# Patient Record
Sex: Female | Born: 1970 | Race: White | Hispanic: No | Marital: Married | Smoking: Never smoker
Health system: Southern US, Community
[De-identification: ages and names within clinical notes are randomized; demographics above are authoritative.]

## PROBLEM LIST (undated history)

## (undated) DIAGNOSIS — I4891 Unspecified atrial fibrillation: Secondary | ICD-10-CM

## (undated) DIAGNOSIS — L409 Psoriasis, unspecified: Secondary | ICD-10-CM

## (undated) DIAGNOSIS — E282 Polycystic ovarian syndrome: Secondary | ICD-10-CM

## (undated) DIAGNOSIS — I471 Supraventricular tachycardia, unspecified: Secondary | ICD-10-CM

## (undated) DIAGNOSIS — L97519 Non-pressure chronic ulcer of other part of right foot with unspecified severity: Secondary | ICD-10-CM

## (undated) DIAGNOSIS — E11621 Type 2 diabetes mellitus with foot ulcer: Secondary | ICD-10-CM

## (undated) DIAGNOSIS — E119 Type 2 diabetes mellitus without complications: Secondary | ICD-10-CM

## (undated) DIAGNOSIS — L4050 Arthropathic psoriasis, unspecified: Secondary | ICD-10-CM

## (undated) DIAGNOSIS — E11319 Type 2 diabetes mellitus with unspecified diabetic retinopathy without macular edema: Secondary | ICD-10-CM

## (undated) HISTORY — PX: FRACTURE SURGERY: SHX138

## (undated) HISTORY — PX: FOOT FRACTURE SURGERY: SHX645

---

## 1998-04-27 ENCOUNTER — Ambulatory Visit (HOSPITAL_COMMUNITY): Admission: RE | Admit: 1998-04-27 | Discharge: 1998-04-27 | Payer: Self-pay | Admitting: Gastroenterology

## 1999-12-09 ENCOUNTER — Encounter: Payer: Self-pay | Admitting: Emergency Medicine

## 1999-12-09 ENCOUNTER — Emergency Department (HOSPITAL_COMMUNITY): Admission: EM | Admit: 1999-12-09 | Discharge: 1999-12-09 | Payer: Self-pay | Admitting: Emergency Medicine

## 1999-12-10 ENCOUNTER — Encounter: Payer: Self-pay | Admitting: Emergency Medicine

## 2002-09-03 ENCOUNTER — Encounter: Payer: Self-pay | Admitting: Internal Medicine

## 2002-09-03 ENCOUNTER — Encounter: Admission: RE | Admit: 2002-09-03 | Discharge: 2002-09-03 | Payer: Self-pay | Admitting: Internal Medicine

## 2003-03-11 ENCOUNTER — Encounter: Admission: RE | Admit: 2003-03-11 | Discharge: 2003-03-11 | Payer: Self-pay | Admitting: Internal Medicine

## 2003-03-11 ENCOUNTER — Encounter: Payer: Self-pay | Admitting: Internal Medicine

## 2003-09-19 HISTORY — PX: TUBAL LIGATION: SHX77

## 2004-01-25 ENCOUNTER — Other Ambulatory Visit: Admission: RE | Admit: 2004-01-25 | Discharge: 2004-01-25 | Payer: Self-pay | Admitting: Obstetrics & Gynecology

## 2004-06-20 ENCOUNTER — Inpatient Hospital Stay (HOSPITAL_COMMUNITY): Admission: AD | Admit: 2004-06-20 | Discharge: 2004-06-20 | Payer: Self-pay | Admitting: Obstetrics & Gynecology

## 2004-08-08 ENCOUNTER — Inpatient Hospital Stay (HOSPITAL_COMMUNITY): Admission: AD | Admit: 2004-08-08 | Discharge: 2004-08-12 | Payer: Self-pay | Admitting: Obstetrics & Gynecology

## 2004-08-08 ENCOUNTER — Encounter (INDEPENDENT_AMBULATORY_CARE_PROVIDER_SITE_OTHER): Payer: Self-pay | Admitting: Specialist

## 2006-01-17 ENCOUNTER — Encounter: Payer: Self-pay | Admitting: Internal Medicine

## 2006-11-16 ENCOUNTER — Ambulatory Visit (HOSPITAL_COMMUNITY): Admission: RE | Admit: 2006-11-16 | Discharge: 2006-11-16 | Payer: Self-pay | Admitting: Internal Medicine

## 2009-11-22 ENCOUNTER — Encounter: Payer: Self-pay | Admitting: Endocrinology

## 2009-12-02 ENCOUNTER — Ambulatory Visit: Payer: Self-pay | Admitting: Endocrinology

## 2009-12-02 DIAGNOSIS — E119 Type 2 diabetes mellitus without complications: Secondary | ICD-10-CM | POA: Insufficient documentation

## 2009-12-02 DIAGNOSIS — R718 Other abnormality of red blood cells: Secondary | ICD-10-CM | POA: Insufficient documentation

## 2009-12-02 DIAGNOSIS — E282 Polycystic ovarian syndrome: Secondary | ICD-10-CM | POA: Insufficient documentation

## 2009-12-02 DIAGNOSIS — L4050 Arthropathic psoriasis, unspecified: Secondary | ICD-10-CM | POA: Insufficient documentation

## 2009-12-02 DIAGNOSIS — G43909 Migraine, unspecified, not intractable, without status migrainosus: Secondary | ICD-10-CM | POA: Insufficient documentation

## 2009-12-02 DIAGNOSIS — Z794 Long term (current) use of insulin: Secondary | ICD-10-CM

## 2009-12-02 DIAGNOSIS — K589 Irritable bowel syndrome without diarrhea: Secondary | ICD-10-CM | POA: Insufficient documentation

## 2010-01-10 ENCOUNTER — Ambulatory Visit: Payer: Self-pay | Admitting: Endocrinology

## 2010-01-10 LAB — CONVERTED CEMR LAB
Cholesterol: 153 mg/dL (ref 0–200)
HDL: 33 mg/dL — ABNORMAL LOW (ref >39.00)
Hgb S Quant: 0 % (ref 0.0–0.0)
LDL Cholesterol: 94 mg/dL (ref 0–99)
Total CHOL/HDL Ratio: 5
Triglycerides: 132 mg/dL (ref 0.0–149.0)
VLDL: 26.4 mg/dL (ref 0.0–40.0)

## 2010-01-13 ENCOUNTER — Telehealth: Payer: Self-pay | Admitting: Endocrinology

## 2010-05-19 ENCOUNTER — Telehealth: Payer: Self-pay | Admitting: Endocrinology

## 2010-10-18 NOTE — Progress Notes (Signed)
Summary: OV due  Phone Note Outgoing Call Call back at [facility] Phone [phone]   Call placed by: Brenton Grills MA,  May 19, 2010 9:59 AM Details for Reason: OV due Summary of Call: Per MD, pt is due for an office visit. Left message for pt to callback and schedule appt.

## 2010-10-18 NOTE — Progress Notes (Signed)
Summary: Chol meds  Phone Note Call from Patient Call back at Home Phone [phone]   Caller: Patient Summary of Call: pt called stating that MD left message on PT advising pt to start cholesterol meds and if she wanted it he would provide samples. Pt would like to start medication and pick up samples today to try before paying for a prescription. Initial call taken by: Margaret Pyle, CMA,  January 13, 2010 11:08 AM  Follow-up for Phone Call        i sent rx to pharmacy.  it is very cheap, so we don't get samples.  go to lab in 1 month for lipids 272.0, and liver v58.68. Follow-up by: Minus Breeding MD,  January 13, 2010 12:42 PM  Additional Follow-up for Phone Call Additional follow up Details #1::        left message on machine for pt to return my call. Pt labs scheduled 05/16. Margaret Pyle, CMA  January 13, 2010 12:59 PM  PT INFORMED Additional Follow-up by: Margaret Pyle, CMA,  January 13, 2010 5:00 PM    New/Updated Medications: PRAVASTATIN SODIUM 40 MG TABS (PRAVASTATIN SODIUM) 1 tab at bedtime Prescriptions: PRAVASTATIN SODIUM 40 MG TABS (PRAVASTATIN SODIUM) 1 tab at bedtime  #30 x 11   Entered and Authorized by:   Minus Breeding MD   Signed by:   Minus Breeding MD on 01/13/2010   Method used:   Electronically to        [facility] [address] [phone]* (retail)       [address] [place], [place]  [address]       Ph: [phone]       Fax: [phone]   RxID:   [id number]

## 2010-10-18 NOTE — Assessment & Plan Note (Signed)
Summary: 1 MTH FU  STC   Vital Signs:  Patient profile:   40 year old female Height:      67 inches (170.18 cm) Weight:      253.38 pounds (115.17 kg) O2 Sat:      96 % on Room air Temp:     97.4 degrees F (36.33 degrees C) oral Pulse rate:   102 / minute BP sitting:   110 / 62  (left arm) Cuff size:   large  Vitals Entered By: Josph Macho RMA (January 10, 2010 8:58 AM)  O2 Flow:  Room air CC: 1 month follow up/ CF Is Patient Diabetic? Yes   Referring Provider:  [facility]  CC:  1 month follow up/ CF.  History of Present Illness: the status of at least 3 ongoing medical problems is addressed today: dm: no cbg record, but states cbg's are still 250-250.   pt states she feels well in general. microcytosis:  was noted on recent labs.  pt requests eval of this. hyperlipiidemia:  pt says she hsn't required any medication for this, and is working on her diet.  Current Medications (verified): 1)  Glucophage Xr 500 Mg Xr24h-Tab (Metformin Hcl) .... 4 Tabs Each Am 2)  Naproxen Sodium 550 Mg Tabs (Naproxen Sodium) .... Two Times A Day 3)  Januvia 100 Mg Tabs (Sitagliptin Phosphate) .Marland Kitchen.. 1 Tab Each Am  Allergies (verified): No Known Drug Allergies  Past History:  Past Medical History: Last updated: 12/02/2009 POLYCYSTIC OVARIES (ICD-256.4) IBS (ICD-564.1) MIGRAINE HEADACHE (ICD-346.90) PSORIASIS (ICD-696.1) IDDM (ICD-250.01)  Review of Systems  The patient denies hypoglycemia, weight loss, and weight gain.    Physical Exam  General:  obese.   Extremities:  no edema Additional Exam:  LDL Cholesterol           94 mg/dL   Impression & Recommendations:  Problem # 1:  DM (ICD-250.00) needs increased rx, but tt agrees only to change [place] to victoza.  this change is unlikely to get her a1c to goal  Problem # 2:  hyperlipidemia mild  Problem # 3:  MICROCYTOSIS (ICD-790.09)  Medications Added to Medication List This Visit: 1)  Victoza 18 Mg/55ml Soln  (Liraglutide) .... 0.6 mg each am  Other Orders: T- * Misc. Laboratory test [id number]) TLB-Lipid Panel (80061-LIPID) Est. Patient Level III (44010)  Patient Instructions: 1)  check your blood sugar 1-2 times a day.  vary the time of day when you check, between before the 3 meals, and at bedtime.  also check if you have symptoms of your blood sugar being too high or too low.  please keep a record of the readings and bring it to your next appointment here.  please call us sooner if you are having low blood sugar episodes. 2)  change januvia to victoza, 0.6 mg each am.  then try to increase it to its maximun amount of 1.8 mg each am. 3)  return 2-3 weeks 4)  (update: i left message on phone-tree:  you should consider cholesterol medication.  hb electrophoresis is normal).

## 2010-10-18 NOTE — Assessment & Plan Note (Signed)
Summary: NEW ENDO/[facility]/ON METFORMIN/NOT REGULATED/BMI42 LOSS 76LBS SINC...   Vital Signs:  Patient profile:   40 year old female Height:      67 inches (170.18 cm) Weight:      253.25 pounds (115.11 kg) BMI:     39.81 O2 Sat:      95 % on Room air Temp:     98.2 degrees F (36.78 degrees C) oral Pulse rate:   112 / minute BP sitting:   138 / 82  (left arm) Cuff size:   regular  Vitals Entered By: Josph Macho RMA (December 02, 2009 1:22 PM)  O2 Flow:  Room air CC: New Endo: Blood Sugar high/ CF Is Patient Diabetic? Yes   Referring Provider:  [facility]  CC:  New Endo: Blood Sugar high/ CF.  History of Present Illness: pt states 10 years h/o dm.  she denies knowing of any chronic complications.  she has never been on insulin, except for a 2002 pregnancy.  she takes metformin until she ran out 3 weeks ago.   pt says her diet is "good," and her exercise is poor.   symptomatically, pt states slight excess hair growth on the face, and associated hair loss on the head.   Current Medications (verified): 1)  Glucophage Xr 500 Mg Xr24h-Tab (Metformin Hcl) .... 2 Daily 2)  Naproxen Sodium 550 Mg Tabs (Naproxen Sodium) .... Two Times A Day  Allergies (verified): No Known Drug Allergies  Past History:  Past Medical History: POLYCYSTIC OVARIES (ICD-256.4) IBS (ICD-564.1) MIGRAINE HEADACHE (ICD-346.90) PSORIASIS (ICD-696.1) IDDM (ICD-250.01)  Past Surgical History: tubal ligation 2005  Family History: Reviewed history and no changes required. 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  she has lost 70 lbs x 7 years.  she has intermittent headache, nocturnal leg cramps, easy bruising,and slight blurry vision.  she had neg eval for chest pain.  she has polyuria and  polydipsia.   Physical Exam  General:  morbidly obese.   Head:  head: no deformity eyes: no periorbital swelling, no proptosis external nose and ears are normal mouth: no lesion seen Neck:  Supple without thyroid enlargement or tenderness.  Lungs:  Clear to auscultation bilaterally. Normal respiratory effort.  Heart:  Regular rate and rhythm without murmurs or gallops noted. Normal S1,S2.   Msk:  muscle bulk and strength are grossly normal.  no obvious joint swelling.  gait is normal and steady  Extremities:  no deformity.  no ulcer on the feet.  feet are of normal color and temp.  there is bilateral onychomycosis trace right pedal edema and trace left pedal edema.   Neurologic:  cn 2-12 grossly intact.   readily moves all 4's.   sensation is intact to touch on all 4's Skin:  there is slight terminal hair on the face there are striae on the abdomen and flanks, but not dark-colored. Cervical Nodes:  No significant adenopathy.  Psych:  Alert and cooperative; normal mood and affect; normal attention span and concentration.   Additional Exam:  outside test results are reviewed:  a1c=12.4 mcv=78 hemoglobin=normal   Impression & Recommendations:  Problem # 1:  DM (ICD-250.00) needs increased rx  Problem # 2:  edema, mild this is a relative contraindication to actos  Problem # 3:  POLYCYSTIC OVARIES (ICD-256.4) with mild hirsutism  Problem # 4:  microcytosis  Medications Added to Medication List This Visit: 1)  Glucophage Xr 500 Mg Xr24h-tab (Metformin hcl) .... 2 daily 2)  Glucophage Xr 500 Mg Xr24h-tab (Metformin hcl) .... 4 tabs each am 3)  Naproxen Sodium 550 Mg Tabs (Naproxen sodium) .... Two times a day 4)  Januvia 100 Mg Tabs (Sitagliptin phosphate) .Marland Kitchen.. 1 tab each am  Other Orders: Consultation Level IV ([address])  Patient Instructions: 1)  we discussed the importance of diet and exercise therapy and the risks of diabetes.  you should see an eye doctor every  year. 2)  it is very important to keep good control of blood pressure and cholesterol, especially in those with diabetes.  stopping smoking also reduces the damage diabetes does to your body.  please discuss these with your doctor.  you should take an aspirin every day, unless you have been advised by a doctor not to. 3)  i told pt we will need to take this complex situation in stages 4)  check your blood sugar 1-2 times a day.  vary the time of day when you check, between before the 3 meals, and at bedtime.  also check if you have symptoms of your blood sugar being too high or too low.  please keep a record of the readings and bring it to your next appointment here.  please call us sooner if you are having low blood sugar episodes. 5)  please consider weight-loss surgery.  please let me know if you want to pursue, and i'll request an appointment at an upcoming informational meeting. 6)  plan will be for hemoglobin electophoresis and lipids with next labs. 7)  for now: 8)  resume metformin-xr 5x 500 mg each am 9)  add januvia 100 mg each am. 10)  call next week if blood sugar is still over 200, and we'll consider changing the [place] to victoza. 11)  Please schedule a follow-up appointment in 1 month. Prescriptions: JANUVIA 100 MG TABS (SITAGLIPTIN PHOSPHATE) 1 tab each am  #30 x 11   Entered and Authorized by:   Minus Breeding MD   Signed by:   Minus Breeding MD on 12/02/2009   Method used:   Electronically to        [facility] [address] [phone]* (retail)       [address] [place], [place]  [address]       Ph: [phone]       Fax: [phone]   RxID:   [id number] GLUCOPHAGE XR 500 MG XR24H-TAB (METFORMIN HCL) 4 tabs each am  #360 x 11   Entered and Authorized by:   Minus Breeding MD   Signed by:   Minus Breeding MD on 12/02/2009   Method used:   Electronically to        [facility] [address] [phone]* (retail)       [address] [place], [place]  [address]       Ph:  [phone]       Fax: [phone]   RxID:   [id number]

## 2011-02-03 NOTE — H&P (Signed)
[name], [name] NO.:  [id number]   MEDICAL RECORD NO.:  [id number]          PATIENT TYPE:  MAT   LOCATION:  [facility]C                          [facility]ILITY:  [facility]   PHYSICIAN:  Lenoard Aden, M.D.DATE OF BIRTH:  [birth year]   DATE OF ADMISSION:  06/20/2004  DATE OF DISCHARGE:                                HISTORY & PHYSICAL   CHIEF COMPLAINT:  Bleeding.   HISTORY OF PRESENT ILLNESS:  Patient is a 40 year old white female G2, P30,  EDD of August 21, 2004 at [redacted] weeks gestation who presents with one episode  of bright red blood on the toilet paper this afternoon.  She denies any  worsening lower abdominal cramping, denies any heavy bleeding, does report  decreased fetal movement today.  Her medications include prenatal vitamins  and insulin multidose injections for adult-onset diabetes.  She has an  obstetric history remarkable for a 36-week delivery of a 7-pound 5-ounce  female in 28.  She has a history of polycystic ovarian disease and history  of asthma.  She has a family history of heart disease, diabetes,  neurovascular disease.  Prenatal lab data reveals a blood type of A  negative, she is status post RhoGAM on June 02, 2004, rubella immune,  hepatitis/HIV negative, GC/Chlamydia negative.  Prenatal course was  uncomplicated.  She has had normal growth on ultrasound surveillance.  She  has had borderline glucose compliance and reportedly a normal fetal  echocardiogram.  She had a normal ultrasound as recently as June 02, 2004 with a normal posterior placenta and an AGA fetus.   PHYSICAL EXAMINATION:  On physical exam vital signs are stable, patient is  afebrile, HEENT normal, lungs clear, heart regular rhythm, abdomen soft,  gravid, obese and nontender.  Cath UA is obtained.  Sterile speculum exam  reveals no evidence of blood in the vagina, cervix is closed and long,  presenting part is out of the pelvis.  Hemoccult is performed and is  pending.   Questionable small internal hemorrhoid is palpable, no external  hemorrhoids are noted.  Extremities feel no cords.  Neurologic exam is  nonfocal.   IMPRESSION:  1.  Thirty-one week obstetric.  2.  Body mass index greater than 35.  3.  Insulin-dependent diabetes with normal surveillance.  4.  Unexplained third trimester bleeding questionable source.   PLAN:  Check Hemoccult, check urinalysis, check complete obstetric  ultrasound.  We will discharge home pending results.      RJT/MEDQ  D:  06/20/2004  T:  06/20/2004  Job:  [id number]   cc:   Ma Hillock

## 2011-02-03 NOTE — Discharge Summary (Signed)
[name], [name]              ACCOUNT NO.:  [id number]   MEDICAL RECORD NO.:  [id number]          PATIENT TYPE:  INP   LOCATION:  9124                          [facility]ILITY:  [facility]   PHYSICIAN:  Genia Del, M.D.DATE OF BIRTH:  September 10, 1971   DATE OF ADMISSION:  08/08/2004  DATE OF DISCHARGE:  08/12/2004                                 DISCHARGE SUMMARY   ADMISSION DIAGNOSES:  1.  Thirty-eight weeks.  2.  Diabetes mellitus type 2, on insulin.  3.  Obesity.  4.  Suspicion of macrosomia.  5.  Group B streptococcus positive.  6.  Spontaneous labor.  7.  Spontaneous rupture of membranes.  8.  Desire for bilateral tubal sterilization.   DISCHARGE DIAGNOSES:  1.  Thirty-eight weeks.  2.  Diabetes mellitus type 2, on insulin.  3.  Obesity.  4.  Suspicion of macrosomia.  5.  Group B streptococcus positive.  6.  Spontaneous labor.  7.  Spontaneous rupture of membranes.  8.  Desire for bilateral tubal sterilization.  9.  Confirmed macrosomia, neonatal weight 4320 g, baby girl born by cesarean      section.   INTERVENTION:  Primary urgent low transverse C-section and modified Pomeroy  bilateral tubal sterilization.   HOSPITAL COURSE:  The patient's surgery went without any complication.  Estimated blood loss was 700 mL.  Postoperative evolution was unremarkable.  The patient remained afebrile and hemodynamically stable.  Her postoperative  hemoglobin was 10.4, hematocrit 31.1.  Her blood sugars were within normal  limits on Glucophage 500 p.o. b.i.d.  She was discharged on postoperative  day #4 in stable status.  Glucophage was prescribed.  She will follow up  with endocrinology and will have her postoperative visit in 4 weeks at  [facility].  Postoperative advise was given.  Motrin p.r.n. will be  used for pain.      ML/MEDQ  D:  09/12/2004  T:  09/12/2004  Job:  [id number]

## 2011-02-03 NOTE — Op Note (Signed)
[name], [name]              ACCOUNT NO.:  [id number]   MEDICAL RECORD NO.:  [id number]          PATIENT TYPE:  INP   LOCATION:  9124                          [facility]   PHYSICIAN:  Genia Del, M.D.DATE OF BIRTH:  09-13-1971   DATE OF PROCEDURE:  08/08/2004  DATE OF DISCHARGE:                                 OPERATIVE REPORT   PREOPERATIVE DIAGNOSES:  1.  Thirty-eight week gestation.  2.  Diabetes mellitus type 2, on insulin with difficult control.  3.  Morbid obesity.  4.  Suspicion of macrosomia.  5.  Polyhydramnios.  6.  Group B Streptococcus positive.  7.  Spontaneous labor and spontaneous rupture of membranes.  8.  Desire for bilateral tubal sterilization.   POSTOPERATIVE DIAGNOSES:  1.  Thirty-eight week gestation.  2.  Diabetes mellitus type 2, on insulin with difficult control.  3.  Morbid obesity.  4.  Suspicion of macrosomia.  5.  Polyhydramnios.  6.  Group B Streptococcus positive.  7.  Spontaneous labor and spontaneous rupture of membranes.  8.  Desire for bilateral tubal sterilization.   INTERVENTION:  1.  Primary urgent low transverse cesarean section.  2.  Modified Pomeroy bilateral tubal sterilization.   SURGEON:  Genia Del, M.D.   ASSISTANT:  Pershing Cox, M.D.   ANESTHESIOLOGIST:  Raul Del, M.D.   PROCEDURE:  Under spinal anesthesia, the patient is in 15 degree left  decubitus position.  She is prepped with Hibiclens on the abdominal,  suprapubic, vulvar, and vaginal areas.  The bladder catheter is inserted and  the patient is draped as usual.  The adipose panniculus is lifted upward  with tape to ease the surgery.  We infiltrate Marcaine 0.25% plain 25 mL at  the incision site.  We make a Pfannenstiel incision with a scalpel and open  the adipose tissue with the electrocautery.  The aponeurosis is opened  transversely with Mayo scissors.  The aponeurosis is separated from the  recti muscles on the midline  with Mayo scissors and the electrocautery at  cutting.  We then open the parietal peritoneum longitudinally.  We then open  the visceral peritoneum transversely over the lower uterine segment and  retract the bladder downward.  The bladder retractor is inserted.  A large  Senaida Ores is used for retraction.  A low transverse hysterotomy is done  with the scalpel and __________ on each side with dressing scissors.  The  amniotic fluid is clear.  The fetus is in cephalic presentation.  Birth at  74 of a baby girl.  One loose nuchal cord was present.  The baby was  suctioned after delivery of the head.  The cord was clamped and cut and the  baby was given to the neonatal team.  Apgars were 7 and 9, pH was 7.27.  We  also took the cord blood.  The placenta was evacuated spontaneously and sent  to pathology.  Uterine revision was done.  The uterus contracts well.  We  close the hysterotomy in a locked running suture of 0 Vicryl.  Hemostatic X  stitches were done  with 0 Vicryl at the right angle.  We then proceeded with  the bilateral tubal sterilization with a modified Pomeroy technique.  We  started on the left side.  A window was created in the mesosalpinx and plain  0 was used to ligate the tube proximally and distally.  A section of the  tube was resected and sent to pathology and coagulation was used at each  extremity of the cut tube.  We proceeded the same way on the right side.  Hemostasis was adequate at both levels.  Both ovaries were normal in  appearance and size.  We verify hemostasis at the hysterotomy site again,  and it is adequate.  We irrigate and suction the abdominopelvic cavity.  Verification of hemostasis is done at the bladder flap and at the recti  muscles.  It is completed with the electrocautery.  The aponeurosis is  closed in two half running sutures of 0 Vicryl.  Plain 3-0 is then used to  reapproximate the adipose tissue and avoid open spaces.  We then   reapproximate the skin with staples.  A dry dressing is applied.  The count  of sponges and instruments was complete x2.  The estimated blood loss was  700 mL.  No complication occurred, and the patient was brought to the  recovery room in good status.  A dose of Ancef 2 g IV was given after cord  clamping, and Pitocin was given in the IV fluids after delivery of the  placenta.  The procedure was made more difficult because of morbid obesity,  but no complication occurred.      ML/MEDQ  D:  08/08/2004  T:  08/09/2004  Job:  [id number]

## 2011-10-09 ENCOUNTER — Emergency Department (HOSPITAL_COMMUNITY): Payer: 59

## 2011-10-09 ENCOUNTER — Encounter (HOSPITAL_COMMUNITY): Payer: Self-pay | Admitting: Internal Medicine

## 2011-10-09 ENCOUNTER — Other Ambulatory Visit: Payer: Self-pay

## 2011-10-09 ENCOUNTER — Inpatient Hospital Stay (HOSPITAL_COMMUNITY)
Admission: EM | Admit: 2011-10-09 | Discharge: 2011-10-12 | DRG: 690 | Disposition: A | Discharge status: Home / Self Care | Payer: 59 | Attending: Internal Medicine | Admitting: Internal Medicine

## 2011-10-09 DIAGNOSIS — Z79899 Other long term (current) drug therapy: Secondary | ICD-10-CM

## 2011-10-09 DIAGNOSIS — E119 Type 2 diabetes mellitus without complications: Secondary | ICD-10-CM | POA: Diagnosis present

## 2011-10-09 DIAGNOSIS — D509 Iron deficiency anemia, unspecified: Secondary | ICD-10-CM | POA: Diagnosis present

## 2011-10-09 DIAGNOSIS — E282 Polycystic ovarian syndrome: Secondary | ICD-10-CM | POA: Diagnosis present

## 2011-10-09 DIAGNOSIS — Z6838 Body mass index (BMI) 38.0-38.9, adult: Secondary | ICD-10-CM

## 2011-10-09 DIAGNOSIS — F329 Major depressive disorder, single episode, unspecified: Secondary | ICD-10-CM | POA: Diagnosis present

## 2011-10-09 DIAGNOSIS — Z9119 Patient's noncompliance with other medical treatment and regimen: Secondary | ICD-10-CM

## 2011-10-09 DIAGNOSIS — N10 Acute tubulo-interstitial nephritis: Secondary | ICD-10-CM | POA: Diagnosis present

## 2011-10-09 DIAGNOSIS — E876 Hypokalemia: Secondary | ICD-10-CM | POA: Diagnosis present

## 2011-10-09 DIAGNOSIS — N12 Tubulo-interstitial nephritis, not specified as acute or chronic: Secondary | ICD-10-CM

## 2011-10-09 DIAGNOSIS — E669 Obesity, unspecified: Secondary | ICD-10-CM | POA: Diagnosis present

## 2011-10-09 DIAGNOSIS — F3289 Other specified depressive episodes: Secondary | ICD-10-CM | POA: Diagnosis present

## 2011-10-09 DIAGNOSIS — Z91199 Patient's noncompliance with other medical treatment and regimen due to unspecified reason: Secondary | ICD-10-CM

## 2011-10-09 DIAGNOSIS — A498 Other bacterial infections of unspecified site: Secondary | ICD-10-CM | POA: Diagnosis present

## 2011-10-09 LAB — DIFFERENTIAL
Basophils Absolute: 0 10*3/uL (ref 0.0–0.1)
Lymphocytes Relative: 7 % — ABNORMAL LOW (ref 12–46)
Monocytes Absolute: 1.2 10*3/uL — ABNORMAL HIGH (ref 0.1–1.0)
Neutro Abs: 12.1 10*3/uL — ABNORMAL HIGH (ref 1.7–7.7)
Neutrophils Relative %: 85 % — ABNORMAL HIGH (ref 43–77)

## 2011-10-09 LAB — BASIC METABOLIC PANEL
CO2: 21 mEq/L (ref 19–32)
Chloride: 101 mEq/L (ref 96–112)
Creatinine, Ser: 0.44 mg/dL — ABNORMAL LOW (ref 0.50–1.10)
Potassium: 3 mEq/L — ABNORMAL LOW (ref 3.5–5.1)
Sodium: 133 mEq/L — ABNORMAL LOW (ref 135–145)

## 2011-10-09 LAB — URINE CULTURE
Colony Count: >=100000
Culture  Setup Time: 201301212101

## 2011-10-09 LAB — URINALYSIS, ROUTINE W REFLEX MICROSCOPIC
Glucose, UA: >1000 mg/dL — AB
Specific Gravity, Urine: 1.024 (ref 1.005–1.030)
pH: 5.5 (ref 5.0–8.0)

## 2011-10-09 LAB — CBC
HCT: 32.6 % — ABNORMAL LOW (ref 36.0–46.0)
RDW: 15.7 % — ABNORMAL HIGH (ref 11.5–15.5)
WBC: 14.3 10*3/uL — ABNORMAL HIGH (ref 4.0–10.5)

## 2011-10-09 LAB — URINE MICROSCOPIC-ADD ON

## 2011-10-09 LAB — GLUCOSE, CAPILLARY: Glucose-Capillary: 336 mg/dL — ABNORMAL HIGH (ref 70–99)

## 2011-10-09 MED ORDER — ONDANSETRON HCL 4 MG/2ML IJ SOLN
4.0000 mg | Freq: Once | INTRAMUSCULAR | Status: AC
  Administered 2011-10-09: 4 mg via INTRAVENOUS
  Filled 2011-10-09: qty 2

## 2011-10-09 MED ORDER — DOCUSATE SODIUM 100 MG PO CAPS
100.0000 mg | ORAL_CAPSULE | Freq: Two times a day (BID) | ORAL | Status: DC
  Administered 2011-10-10 – 2011-10-12 (×3): 100 mg via ORAL
  Filled 2011-10-09 (×8): qty 1

## 2011-10-09 MED ORDER — SODIUM CHLORIDE 0.9 % IV BOLUS (SEPSIS)
1000.0000 mL | Freq: Once | INTRAVENOUS | Status: AC
  Administered 2011-10-09: 1000 mL via INTRAVENOUS

## 2011-10-09 MED ORDER — POTASSIUM CHLORIDE CRYS ER 20 MEQ PO TBCR
40.0000 meq | EXTENDED_RELEASE_TABLET | Freq: Every day | ORAL | Status: DC
  Administered 2011-10-09 – 2011-10-12 (×3): 40 meq via ORAL
  Filled 2011-10-09 (×4): qty 2

## 2011-10-09 MED ORDER — DEXTROSE 5 % IV SOLN
1.0000 g | INTRAVENOUS | Status: DC
  Administered 2011-10-09 – 2011-10-10 (×2): 1 g via INTRAVENOUS
  Filled 2011-10-09 (×3): qty 10

## 2011-10-09 MED ORDER — SODIUM CHLORIDE 0.9 % IV SOLN
Freq: Once | INTRAVENOUS | Status: AC
  Administered 2011-10-09: 20:00:00 via INTRAVENOUS

## 2011-10-09 MED ORDER — DEXTROSE 5 % IV SOLN: 1.0000 g | INTRAVENOUS | Status: DC

## 2011-10-09 MED ORDER — ONDANSETRON HCL 4 MG/2ML IJ SOLN
4.0000 mg | Freq: Four times a day (QID) | INTRAMUSCULAR | Status: DC | PRN
  Administered 2011-10-10: 4 mg via INTRAVENOUS
  Filled 2011-10-09: qty 2

## 2011-10-09 MED ORDER — HYDROMORPHONE HCL PF 1 MG/ML IJ SOLN
1.0000 mg | INTRAMUSCULAR | Status: DC | PRN
  Administered 2011-10-10 (×3): 1 mg via INTRAVENOUS
  Filled 2011-10-09 (×3): qty 1

## 2011-10-09 MED ORDER — INSULIN ASPART 100 UNIT/ML ~~LOC~~ SOLN
0.0000 [IU] | Freq: Three times a day (TID) | SUBCUTANEOUS | Status: DC
  Administered 2011-10-10 (×2): 11 [IU] via SUBCUTANEOUS
  Filled 2011-10-09: qty 3

## 2011-10-09 MED ORDER — IOHEXOL 300 MG/ML  SOLN
100.0000 mL | Freq: Once | INTRAMUSCULAR | Status: AC | PRN
  Administered 2011-10-09: 100 mL via INTRAVENOUS

## 2011-10-09 MED ORDER — ONDANSETRON HCL 4 MG PO TABS: 4.0000 mg | ORAL_TABLET | Freq: Four times a day (QID) | ORAL | Status: DC | PRN

## 2011-10-09 MED ORDER — SERTRALINE HCL 25 MG PO TABS
12.5000 mg | ORAL_TABLET | ORAL | Status: DC
  Filled 2011-10-09 (×2): qty 0.5

## 2011-10-09 MED ORDER — SODIUM CHLORIDE 0.9 % IV SOLN: INTRAVENOUS | Status: DC

## 2011-10-09 MED ORDER — ACETAMINOPHEN 325 MG PO TABS
650.0000 mg | ORAL_TABLET | Freq: Four times a day (QID) | ORAL | Status: DC | PRN
  Administered 2011-10-10 – 2011-10-11 (×5): 650 mg via ORAL
  Filled 2011-10-09 (×5): qty 2

## 2011-10-09 MED ORDER — ACETAMINOPHEN 650 MG RE SUPP: 650.0000 mg | Freq: Four times a day (QID) | RECTAL | Status: DC | PRN

## 2011-10-09 MED ORDER — IOHEXOL 300 MG/ML  SOLN
40.0000 mL | Freq: Once | INTRAMUSCULAR | Status: AC | PRN
  Administered 2011-10-09: 40 mL via ORAL

## 2011-10-09 MED ORDER — INSULIN ASPART 100 UNIT/ML ~~LOC~~ SOLN
6.0000 [IU] | Freq: Three times a day (TID) | SUBCUTANEOUS | Status: DC
  Administered 2011-10-10: 6 [IU] via SUBCUTANEOUS

## 2011-10-09 MED ORDER — POTASSIUM CHLORIDE 10 MEQ/100ML IV SOLN
10.0000 meq | INTRAVENOUS | Status: AC
  Administered 2011-10-09 – 2011-10-10 (×4): 10 meq via INTRAVENOUS
  Filled 2011-10-09 (×4): qty 100

## 2011-10-09 MED ORDER — HYDROMORPHONE HCL PF 1 MG/ML IJ SOLN
1.0000 mg | INTRAMUSCULAR | Status: DC | PRN
  Administered 2011-10-09 (×3): 1 mg via INTRAVENOUS
  Filled 2011-10-09 (×3): qty 1

## 2011-10-09 NOTE — ED Provider Notes (Signed)
History     CSN: [id number]  Arrival date & time 10/09/11  1733   First MD Initiated Contact with Patient 10/09/11 1758      Chief Complaint  Patient presents with  . Chest Pain  . Migraine  . Abdominal Pain  . Fever    (Consider location/radiation/quality/duration/timing/severity/associated sxs/prior treatment) Patient is a 41 y.o. female presenting with chest pain, migraine, abdominal pain, and fever. The history is provided by the patient.  Chest Pain Primary symptoms include a fever and abdominal pain.    Migraine Associated symptoms include chest pain and abdominal pain.  Abdominal Pain The primary symptoms of the illness include abdominal pain and fever.  Fever Primary symptoms of the febrile illness include fever and abdominal pain.   patient presents with acute onset of right lower quadrant pain which started yesterday. Pain described as sharp in nature and associated with a fever of 101. No urinary symptoms noted. Denies any vaginal bleeding or discharge. She has had emesis x3 without diarrhea. No history of same. No medications taken prior to arrival. Pain worse with movement, better with nothing. Patient has a history of a tubal ligation.  No past medical history on file.  No past surgical history on file.  No family history on file.  History  Substance Use Topics  . Smoking status: Not on file  . Smokeless tobacco: Not on file  . Alcohol Use: Not on file    OB History    No data available      Review of Systems  Constitutional: Positive for fever.  Cardiovascular: Positive for chest pain.  Gastrointestinal: Positive for abdominal pain.  All other systems reviewed and are negative.    Allergies  Review of patient's allergies indicates no known allergies.  Home Medications   Current Outpatient Rx  Name Route Sig Dispense Refill  . METFORMIN HCL 500 MG PO TABS Oral Take 1,000 mg by mouth 2 (two) times daily with a meal.    . SERTRALINE HCL 25  MG PO TABS Oral Take 12.5 mg by mouth every morning.      BP 136/71  Pulse 137  Temp(Src) 99.5 F (37.5 C) (Oral)  Resp 19  SpO2 97%  Physical Exam  Nursing note and vitals reviewed. Constitutional: She is oriented to person, place, and time. She appears well-developed and well-nourished.  Non-toxic appearance. No distress.  HENT:  Head: Normocephalic and atraumatic.  Eyes: Conjunctivae, EOM and lids are normal. Pupils are equal, round, and reactive to light.  Neck: Normal range of motion. Neck supple. No tracheal deviation present. No mass present.  Cardiovascular: Regular rhythm and normal heart sounds.  Tachycardia present.  Exam reveals no gallop.   No murmur heard. Pulmonary/Chest: Effort normal and breath sounds normal. No stridor. No respiratory distress. She has no decreased breath sounds. She has no wheezes. She has no rhonchi. She has no rales.  Abdominal: Soft. Normal appearance and bowel sounds are normal. She exhibits no distension. There is tenderness in the right lower quadrant. There is guarding. There is no rigidity, no rebound and no CVA tenderness.  Musculoskeletal: Normal range of motion. She exhibits no edema and no tenderness.  Neurological: She is alert and oriented to person, place, and time. She has normal strength. No cranial nerve deficit or sensory deficit. GCS eye subscore is 4. GCS verbal subscore is 5. GCS motor subscore is 6.  Skin: Skin is warm and dry. No abrasion and no rash noted.  Psychiatric: She has  a normal mood and affect. Her speech is normal and behavior is normal.    ED Course  Procedures (including critical care time)  Labs Reviewed - No data to display No results found.   No diagnosis found.    MDM  Patient given IV fluids and pain medication here. Started on Rocephin for her urinary tract infection. Abdominal CAT scan results reviewed no signs of acute intra-abdominal process. Suspect the patient has pyelonephritis. Spoke with  [facility] hospitalist and he would        Toy Baker, MD 10/09/11 2143

## 2011-10-09 NOTE — ED Notes (Signed)
Pt with headache that began yesterday. Vomited x2. C/o sharp RLQ pain . Over the last hour developed chest pain radiating to left shoulder. 20g LAC, bbg 302, 324 ASA, nitro x1 SL which helped.

## 2011-10-09 NOTE — H&P (Signed)
PCP:   Katy Apo, MD, MD   Chief Complaint: Pyelonephritis  HPI:  [name] is an 41 y.o. female with history of diabetes, polycystic kidney disease, obesity, nephrolithiasis, presents to the emergency room with right flank pain, nausea, vomiting, malaise, fever, and slight chills. 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 Evaluation in the emergency room included an abdominal pelvic CT which showed a small ovarian cyst but otherwise unremarkable, a urinalysis with too numerous to count WBCs, many bacteria, and a white count with leukocytosis at 14,000. She has a anemia with hemoglobin of 10.3 g per decaliter. She also has hypokalemia with potassium of 3.0. Hospitalist was asked to admit her for pyelonephritis.  Rewiew of Systems:  The patient denies anorexia, fever, weight loss,, vision loss, decreased hearing, hoarseness, chest pain, syncope, dyspnea on exertion, peripheral edema, balance deficits, hemoptysis, abdominal pain, melena, hematochezia, severe indigestion/heartburn, hematuria, incontinence, genital sores, muscle weakness, suspicious skin lesions, transient blindness, difficulty walking, depression, unusual weight change, abnormal bleeding, enlarged lymph nodes, angioedema, and breast masses.   Past medical history see above   Medications:  HOME MEDS: Prior to Admission medications   Medication Sig Start Date End Date Taking? Authorizing Provider  metFORMIN (GLUCOPHAGE) 500 MG tablet Take 1,000 mg by mouth 2 (two) times daily with a meal.   Yes Historical Provider, MD  sertraline (ZOLOFT) 25 MG tablet Take 12.5 mg by mouth every morning.   Yes Historical Provider, MD     Allergies:  No Known Allergies  Social History:   does not have a smoking history on file. She does not have any smokeless tobacco history on file. Her alcohol and drug histories not on file. she is married, works at 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   Family History: History  reviewed. No pertinent family history.   Physical Exam: Filed Vitals:   10/09/11 1909 10/09/11 2100 10/09/11 2130 10/09/11 2242  BP: 114/58 139/81 138/66 135/69  Pulse: 117 120 122 132  Temp:      TempSrc:      Resp: 22 16 16 24   SpO2: 99% 92% 100% 94%   Blood pressure 135/69, pulse 132, temperature 99.5 F (37.5 C), temperature source Oral, resp. rate 24, SpO2 94.00%.  GEN:  Pleasant person lying in the stretcher in no acute distress; cooperative with exam PSYCH:  alert and oriented x4; does not appear anxious does not appear depressed; affect is normal HEENT: Mucous membranes pink and anicteric; PERRLA; EOM intact; no cervical lymphadenopathy nor thyromegaly or carotid bruit; no JVD; Breasts:: Not examined CHEST WALL: No tenderness CHEST: Normal respiration, clear to auscultation bilaterally HEART:  she is slightly tachycardic at 120 but regular rhythm.; no murmurs rubs or gallops BACK: No kyphosis or scoliosis; no CVA tenderness ABDOMEN: Obese, soft non-tender; no masses, no organomegaly, normal abdominal bowel sounds; no pannus; no intertriginous candida. Rectal Exam: Not done EXTREMITIES: No bone or joint deformity; age-appropriate arthropathy of the hands and knees; no edema; no ulcerations. Genitalia: not examined PULSES: 2+ and symmetric SKIN: Normal hydration no rash or ulceration CNS: Cranial nerves 2-12 grossly intact no focal neurologic deficit   Labs & Imaging Results for orders placed during the hospital encounter of 10/09/11 (from the past 48 hour(s))  URINALYSIS, ROUTINE W REFLEX MICROSCOPIC     Status: Abnormal   Collection Time   10/09/11  6:07 PM      Component Value Range Comment   Color, Urine YELLOW  YELLOW  APPearance CLOUDY (*) CLEAR     Specific Gravity, Urine 1.024  1.005 - 1.030     pH 5.5  5.0 - 8.0     Glucose, UA >1000 (*) NEGATIVE (mg/dL)    Hgb urine dipstick SMALL (*) NEGATIVE     Bilirubin Urine NEGATIVE  NEGATIVE     Ketones, ur 40 (*)  NEGATIVE (mg/dL)    Protein, ur 30 (*) NEGATIVE (mg/dL)    Urobilinogen, UA 0.2  0.0 - 1.0 (mg/dL)    Nitrite POSITIVE (*) NEGATIVE     Leukocytes, UA MODERATE (*) NEGATIVE    URINE MICROSCOPIC-ADD ON     Status: Abnormal   Collection Time   10/09/11  6:07 PM      Component Value Range Comment   Squamous Epithelial / LPF MANY (*) RARE     WBC, UA TOO NUMEROUS TO COUNT  <3 (WBC/hpf) WBC'S NOTED IN CLUMPS   RBC / HPF 0-2  <3 (RBC/hpf)    Bacteria, UA MANY (*) RARE    POCT PREGNANCY, URINE     Status: Normal   Collection Time   10/09/11  6:15 PM      Component Value Range Comment   Preg Test, Ur NEGATIVE     CBC     Status: Abnormal   Collection Time   10/09/11  6:40 PM      Component Value Range Comment   WBC 14.3 (*) 4.0 - 10.5 (K/uL)    RBC 4.38  3.87 - 5.11 (MIL/uL)    Hemoglobin 10.3 (*) 12.0 - 15.0 (g/dL)    HCT 16.1 (*) 09.6 - 46.0 (%)    MCV 74.4 (*) 78.0 - 100.0 (fL)    MCH 23.5 (*) 26.0 - 34.0 (pg)    MCHC 31.6  30.0 - 36.0 (g/dL)    RDW 04.5 (*) 40.9 - 15.5 (%)    Platelets 290  150 - 400 (K/uL)   DIFFERENTIAL     Status: Abnormal   Collection Time   10/09/11  6:40 PM      Component Value Range Comment   Neutrophils Relative 85 (*) 43 - 77 (%)    Neutro Abs 12.1 (*) 1.7 - 7.7 (K/uL)    Lymphocytes Relative 7 (*) 12 - 46 (%)    Lymphs Abs 1.0  0.7 - 4.0 (K/uL)    Monocytes Relative 9  3 - 12 (%)    Monocytes Absolute 1.2 (*) 0.1 - 1.0 (K/uL)    Eosinophils Relative 0  0 - 5 (%)    Eosinophils Absolute 0.0  0.0 - 0.7 (K/uL)    Basophils Relative 0  0 - 1 (%)    Basophils Absolute 0.0  0.0 - 0.1 (K/uL)   BASIC METABOLIC PANEL     Status: Abnormal   Collection Time   10/09/11  6:40 PM      Component Value Range Comment   Sodium 133 (*) 135 - 145 (mEq/L)    Potassium 3.0 (*) 3.5 - 5.1 (mEq/L)    Chloride 101  96 - 112 (mEq/L)    CO2 21  19 - 32 (mEq/L)    Glucose, Bld 301 (*) 70 - 99 (mg/dL)    BUN 3 (*) 6 - 23 (mg/dL)    Creatinine, Ser 8.11 (*) 0.50 - 1.10  (mg/dL)    Calcium 8.6  8.4 - 10.5 (mg/dL)    GFR calc non Af Amer >90  >90 (mL/min)    GFR calc Af Amer >90  >  90 (mL/min)    Ct Abdomen Pelvis W Contrast  10/09/2011  *RADIOLOGY REPORT*  Clinical Data: Abdominal pain.  Fever and chills.  Nausea and vomiting.  CT ABDOMEN AND PELVIS WITH CONTRAST  Technique:  Multidetector CT imaging of the abdomen and pelvis was performed following the standard protocol during bolus administration of intravenous contrast.  Contrast: 40mL OMNIPAQUE IOHEXOL 300 MG/ML IV SOLN, OMNIPAQUE IOHEXOL 300 MG/ML IV SOLN  Comparison: None.  Findings: The liver, spleen, pancreas, adrenal glands, and kidneys are normal.  The bowel is normal including the terminal ileum and appendix.  Uterus and right ovary are normal.  15 mm low density lesion in the left ovary, probably a cyst.  No significant osseous abnormalities other than degenerative disc disease at L5-S1 and mild degenerative arthritic changes of the hips.  IMPRESSION: No acute abnormality of the abdomen or pelvis.  15 mm cyst in the left ovary.  Original Report Authenticated By: Gwynn Burly, M.D.      Assessment Present on Admission:  .Polycystic ovaries .DM .Pyelonephritis, acute   PLAN: We'll admit her for symptomatic control of her nausea vomiting. Will give her intravenous fluids along with IV Rocephin and pain medication. I will hold her Glucophage and put on her on insulin sliding scale. Will replete her potassium aggressively.  She is stable, full code, and will be admitted to Dr.Ronald Prolite to service.  Other plans as per orders.    [name] 10/09/2011, 10:57 PM

## 2011-10-10 LAB — CBC
HCT: 34.7 % — ABNORMAL LOW (ref 36.0–46.0)
MCHC: 30.8 g/dL (ref 30.0–36.0)
MCV: 75.4 fL — ABNORMAL LOW (ref 78.0–100.0)
Platelets: 298 10*3/uL (ref 150–400)
RDW: 15.8 % — ABNORMAL HIGH (ref 11.5–15.5)
WBC: 15.5 10*3/uL — ABNORMAL HIGH (ref 4.0–10.5)

## 2011-10-10 LAB — HEMOGLOBIN A1C: Mean Plasma Glucose: 289 mg/dL — ABNORMAL HIGH (ref <117)

## 2011-10-10 LAB — BASIC METABOLIC PANEL
BUN: 3 mg/dL — ABNORMAL LOW (ref 6–23)
Calcium: 8.8 mg/dL (ref 8.4–10.5)
Chloride: 99 mEq/L (ref 96–112)
Creatinine, Ser: 0.46 mg/dL — ABNORMAL LOW (ref 0.50–1.10)
GFR calc Af Amer: >90 mL/min (ref >90)

## 2011-10-10 LAB — GLUCOSE, CAPILLARY
Glucose-Capillary: 251 mg/dL — ABNORMAL HIGH (ref 70–99)
Glucose-Capillary: 195 mg/dL — ABNORMAL HIGH (ref 70–99)

## 2011-10-10 MED ORDER — INSULIN ASPART 100 UNIT/ML ~~LOC~~ SOLN
5.0000 [IU] | Freq: Once | SUBCUTANEOUS | Status: AC
  Administered 2011-10-10: 5 [IU] via SUBCUTANEOUS
  Filled 2011-10-10: qty 3

## 2011-10-10 MED ORDER — POTASSIUM CHLORIDE IN NACL 20-0.9 MEQ/L-% IV SOLN
INTRAVENOUS | Status: DC
  Administered 2011-10-10 – 2011-10-11 (×5): via INTRAVENOUS
  Filled 2011-10-10 (×6): qty 1000

## 2011-10-10 MED ORDER — INSULIN PEN STARTER KIT
1.0000 | Freq: Once | Status: AC
  Administered 2011-10-10: 1
  Filled 2011-10-10: qty 1

## 2011-10-10 MED ORDER — INSULIN GLARGINE 100 UNIT/ML ~~LOC~~ SOLN
5.0000 [IU] | Freq: Every day | SUBCUTANEOUS | Status: DC
  Administered 2011-10-10: 5 [IU] via SUBCUTANEOUS
  Filled 2011-10-10: qty 3

## 2011-10-10 MED ORDER — PANTOPRAZOLE SODIUM 40 MG PO TBEC
40.0000 mg | DELAYED_RELEASE_TABLET | Freq: Every day | ORAL | Status: DC
  Administered 2011-10-10 – 2011-10-11 (×2): 40 mg via ORAL
  Filled 2011-10-10 (×3): qty 1

## 2011-10-10 MED ORDER — HEPARIN SODIUM (PORCINE) 5000 UNIT/ML IJ SOLN
5000.0000 [IU] | Freq: Three times a day (TID) | INTRAMUSCULAR | Status: DC
Start: 2011-10-10 — End: 2011-10-12
  Administered 2011-10-10 – 2011-10-12 (×7): 5000 [IU] via SUBCUTANEOUS
  Filled 2011-10-10 (×9): qty 1

## 2011-10-10 MED ORDER — OXYCODONE-ACETAMINOPHEN 5-325 MG PO TABS: 1.0000 | ORAL_TABLET | Freq: Four times a day (QID) | ORAL | Status: DC | PRN

## 2011-10-10 NOTE — Progress Notes (Signed)
Subjective: H&P reviewed, labs CAT scan reviewed as well. Apparently patient was having nausea vomiting abdominal discomfort and general malaise over last 3-4 days. She's been without her medication for diabetes. A1c at this time is unknown. Patient has never been seen by me in the office. Last suggest sepsis from urinary source, CT without pyelonephritis.  Objective: Vital signs in last 24 hours: Temp:  [98.4 F (36.9 C)-101 F (38.3 C)] 98.4 F (36.9 C) (01/22 0500) Pulse Rate:  [100-137] 100  (01/22 0500) Resp:  [16-24] 18  (01/22 0500) BP: (108-139)/(58-81) 108/70 mmHg (01/22 0500) SpO2:  [92 %-100 %] 96 % (01/22 0500) Weight:  [108.8 kg (239 lb 13.8 oz)] 108.8 kg (239 lb 13.8 oz) (01/21 2319) Weight change:     Intake/Output from previous day: 01/21 0701 - 01/22 0700 In: 1377.5 [I.V.:977.5; IV Piggyback:400] Out: -  Intake/Output this shift: Total I/O In: 480 [P.O.:480] Out: -   General appearance: alert and cooperative Resp: clear to auscultation bilaterally Cardio: regular rate and rhythm, S1, S2 normal, no murmur, click, rub or gallop GI: soft, positive bowel sounds, no hepatosplenomegaly, vague discomfort with palpation Extremities: extremities normal, atraumatic, no cyanosis or edema Neurologic: Grossly normal  Lab Results:  Basename 10/10/11 0500 10/09/11 1840  WBC 15.5* 14.3*  HGB 10.7* 10.3*  HCT 34.7* 32.6*  PLT 298 290   BMET  Basename 10/10/11 0500 10/09/11 1840  NA 133* 133*  K 3.6 3.0*  CL 99 101  CO2 23 21  GLUCOSE 297* 301*  BUN 3* 3*  CREATININE 0.46* 0.44*  CALCIUM 8.8 8.6    Studies/Results: Ct Abdomen Pelvis W Contrast  10/09/2011  *RADIOLOGY REPORT*  Clinical Data: Abdominal pain.  Fever and chills.  Nausea and vomiting.  CT ABDOMEN AND PELVIS WITH CONTRAST  Technique:  Multidetector CT imaging of the abdomen and pelvis was performed following the standard protocol during bolus administration of intravenous contrast.  Contrast: 40mL  OMNIPAQUE IOHEXOL 300 MG/ML IV SOLN, OMNIPAQUE IOHEXOL 300 MG/ML IV SOLN  Comparison: None.  Findings: The liver, spleen, pancreas, adrenal glands, and kidneys are normal.  The bowel is normal including the terminal ileum and appendix.  Uterus and right ovary are normal.  15 mm low density lesion in the left ovary, probably a cyst.  No significant osseous abnormalities other than degenerative disc disease at L5-S1 and mild degenerative arthritic changes of the hips.  IMPRESSION: No acute abnormality of the abdomen or pelvis.  15 mm cyst in the left ovary.  Original Report Authenticated By: Gwynn Burly, M.D.    Medications:  Prior to Admission:  Prescriptions prior to admission  Medication Sig Dispense Refill  . metFORMIN (GLUCOPHAGE) 500 MG tablet Take 1,000 mg by mouth 2 (two) times daily with a meal.      . sertraline (ZOLOFT) 25 MG tablet Take 12.5 mg by mouth every morning.       Scheduled:   . sodium chloride   Intravenous Once  . cefTRIAXone (ROCEPHIN)  IV  1 g Intravenous Q24H  . docusate sodium  100 mg Oral BID  . insulin aspart  0-20 Units Subcutaneous TID WC  . insulin aspart  5 Units Subcutaneous Once  . insulin aspart  6 Units Subcutaneous TID WC  . ondansetron  4 mg Intravenous Once  . potassium chloride  10 mEq Intravenous Q1 Hr x 4  . potassium chloride  40 mEq Oral Daily  . sertraline  12.5 mg Oral Q0700  . sodium chloride  1,000 mL Intravenous Once  . DISCONTD: cefTRIAXone (ROCEPHIN)  IV  1 g Intravenous Q24H   Continuous:   . 0.9 % NaCl with KCl 20 mEq / L 150 mL/hr at 10/10/11 0308  . DISCONTD: sodium chloride      Assessment/Plan: Probable pyelonephritis, continue IV fluids IV Rocephin, followup urine and blood cultures Nausea and vomiting probably secondary to #1. Improved with fluids and Zofran Diabetes poor control, check A1c. Patient had been on metformin at home, may require insulin at discharge. Anemia check serum iron ferritin TIBC Morbid  obesity PCO S. By history Hypokalemia improved Depression recently started on antidepressants one day ago, at this time will hold  LOS: 1 day   Kushal Saunders D 10/10/2011, 10:10 AM

## 2011-10-10 NOTE — Progress Notes (Signed)
Inpatient Diabetes Program Recommendations  AACE/ADA: New Consensus Statement on Inpatient Glycemic Control (2009)  Target Ranges:  Prepandial:   less than 140 mg/dL      Peak postprandial:   less than 180 mg/dL (1-2 hours)      Critically ill patients:  140 - 180 mg/dL   Inpatient Diabetes Program Recommendations  AACE/ADA: New Consensus Statement on Inpatient Glycemic Control (2009)  Target Ranges:  Prepandial:   less than 140 mg/dL      Peak postprandial:   less than 180 mg/dL (1-2 hours)      Critically ill patients:  140 - 180 mg/dL   Reason for Visit: Hyperglycemia  Inpatient Diabetes Program Recommendations Insulin - Basal: May benefit from addition of Lantus insulin- 20 - 30  units with titration as needed to goal CBG before breakfast of closer to 140 -180 mg/dl Outpatient Referral: Hbg A1C of 11.7. Please consider ordering Outpatient Diabetes Education F/U for after discharge.  Note: Did not visit patient at bedside because was informed that patient is very nauseated.

## 2011-10-11 LAB — COMPREHENSIVE METABOLIC PANEL
BUN: 3 mg/dL — ABNORMAL LOW (ref 6–23)
CO2: 22 mEq/L (ref 19–32)
Calcium: 8.1 mg/dL — ABNORMAL LOW (ref 8.4–10.5)
Chloride: 98 mEq/L (ref 96–112)
Creatinine, Ser: 0.39 mg/dL — ABNORMAL LOW (ref 0.50–1.10)
GFR calc Af Amer: >90 mL/min (ref >90)
GFR calc non Af Amer: >90 mL/min (ref >90)
Total Bilirubin: 0.6 mg/dL (ref 0.3–1.2)

## 2011-10-11 LAB — CBC
MCH: 23.4 pg — ABNORMAL LOW (ref 26.0–34.0)
Platelets: 253 10*3/uL (ref 150–400)
RBC: 4.27 MIL/uL (ref 3.87–5.11)
WBC: 8.8 10*3/uL (ref 4.0–10.5)

## 2011-10-11 LAB — GLUCOSE, CAPILLARY: Glucose-Capillary: 208 mg/dL — ABNORMAL HIGH (ref 70–99)

## 2011-10-11 LAB — DIFFERENTIAL
Basophils Absolute: 0 10*3/uL (ref 0.0–0.1)
Eosinophils Relative: 0 % (ref 0–5)
Lymphocytes Relative: 5 % — ABNORMAL LOW (ref 12–46)
Neutro Abs: 7.6 10*3/uL (ref 1.7–7.7)
Neutrophils Relative %: 86 % — ABNORMAL HIGH (ref 43–77)

## 2011-10-11 MED ORDER — INSULIN GLARGINE 100 UNIT/ML ~~LOC~~ SOLN
15.0000 [IU] | Freq: Every day | SUBCUTANEOUS | Status: DC
  Administered 2011-10-11: 15 [IU] via SUBCUTANEOUS

## 2011-10-11 MED ORDER — CIPROFLOXACIN HCL 500 MG PO TABS
500.0000 mg | ORAL_TABLET | Freq: Two times a day (BID) | ORAL | Status: DC
  Administered 2011-10-11 – 2011-10-12 (×2): 500 mg via ORAL
  Filled 2011-10-11 (×4): qty 1

## 2011-10-11 MED ORDER — INSULIN ASPART 100 UNIT/ML ~~LOC~~ SOLN
0.0000 [IU] | Freq: Three times a day (TID) | SUBCUTANEOUS | Status: DC
  Administered 2011-10-11: 7 [IU] via SUBCUTANEOUS
  Administered 2011-10-11: 4 [IU] via SUBCUTANEOUS
  Administered 2011-10-11 – 2011-10-12 (×2): 7 [IU] via SUBCUTANEOUS

## 2011-10-11 NOTE — Progress Notes (Signed)
MD- Please place order for "Consult Diabetes OP Education"  in EPIC so pt may attend follow-up session with Certified Diabetes Educator after d/c at the [facility].  RNs instructing patient on insulin pen use in case you decide to d/c pt home on insulin.  Will follow.

## 2011-10-11 NOTE — Progress Notes (Signed)
Subjective: Patient feels much better today, no nausea no vomiting, she tolerated p.o. Intake. Her white count is down, temperature is down. Urine culture shows Escherichia coli, blood cultures are pending. Her A1c is greater than 11. We've had an extensive conversation about the need to take medication and the need for insulin.  Objective: Vital signs in last 24 hours: Temp:  [99.9 F (37.7 C)-102.7 F (39.3 C)] 100.4 F (38 C) (01/23 0500) Pulse Rate:  [116-131] 116  (01/23 0500) Resp:  [18] 18  (01/23 0500) BP: (118-157)/(66-87) 123/66 mmHg (01/23 0500) SpO2:  [97 %-100 %] 97 % (01/23 0500) Weight:  [111.4 kg (245 lb 9.5 oz)] 111.4 kg (245 lb 9.5 oz) (01/23 0500) Weight change: 2.6 kg (5 lb 11.7 oz)    Intake/Output from previous day: 01/22 0701 - 01/23 0700 In: 3050 [P.O.:1200; I.V.:1800; IV Piggyback:50] Out: -  Intake/Output this shift: Total I/O In: 450 [P.O.:450] Out: -   General appearance: alert and cooperative Resp: clear to auscultation bilaterally Cardio: regular rate and rhythm, S1, S2 normal, no murmur, click, rub or gallop Extremities: extremities normal, atraumatic, no cyanosis or edema Abdomen soft, positive bowel sounds, no organomegaly Lab Results:  Basename 10/11/11 0510 10/10/11 0500  WBC 8.8 15.5*  HGB 10.0* 10.7*  HCT 32.3* 34.7*  PLT 253 298   BMET  Basename 10/11/11 0510 10/10/11 0500  NA 132* 133*  K 3.6 3.6  CL 98 99  CO2 22 23  GLUCOSE 255* 297*  BUN 3* 3*  CREATININE 0.39* 0.46*  CALCIUM 8.1* 8.8    Studies/Results: Ct Abdomen Pelvis W Contrast  10/09/2011  *RADIOLOGY REPORT*  Clinical Data: Abdominal pain.  Fever and chills.  Nausea and vomiting.  CT ABDOMEN AND PELVIS WITH CONTRAST  Technique:  Multidetector CT imaging of the abdomen and pelvis was performed following the standard protocol during bolus administration of intravenous contrast.  Contrast: 40mL OMNIPAQUE IOHEXOL 300 MG/ML IV SOLN, OMNIPAQUE IOHEXOL 300 MG/ML IV  SOLN  Comparison: None.  Findings: The liver, spleen, pancreas, adrenal glands, and kidneys are normal.  The bowel is normal including the terminal ileum and appendix.  Uterus and right ovary are normal.  15 mm low density lesion in the left ovary, probably a cyst.  No significant osseous abnormalities other than degenerative disc disease at L5-S1 and mild degenerative arthritic changes of the hips.  IMPRESSION: No acute abnormality of the abdomen or pelvis.  15 mm cyst in the left ovary.  Original Report Authenticated By: Gwynn Burly, M.D.    Medications:  Prior to Admission:  Prescriptions prior to admission  Medication Sig Dispense Refill  . metFORMIN (GLUCOPHAGE) 500 MG tablet Take 1,000 mg by mouth 2 (two) times daily with a meal.      . sertraline (ZOLOFT) 25 MG tablet Take 12.5 mg by mouth every morning.       Scheduled:   . cefTRIAXone (ROCEPHIN)  IV  1 g Intravenous Q24H  . docusate sodium  100 mg Oral BID  . Flexpen Starter Kit  1 kit Other Once  . heparin subcutaneous  5,000 Units Subcutaneous Q8H  . insulin aspart  0-20 Units Subcutaneous TID WC  . insulin glargine  15 Units Subcutaneous QHS  . pantoprazole  40 mg Oral Q1200  . potassium chloride  40 mEq Oral Daily  . DISCONTD: insulin aspart  0-20 Units Subcutaneous TID WC  . DISCONTD: insulin aspart  6 Units Subcutaneous TID WC  . DISCONTD: insulin glargine  5 Units Subcutaneous QHS   Continuous:   . DISCONTD: 0.9 % NaCl with KCl 20 mEq / L 150 mL/hr at 10/11/11 4098    Assessment/Plan: Pyelonephritis, urine culture showing Escherichia coli, blood culture pending. At this time would change antibiotics to Cipro pending results. Diabetes poor control insulin will be continued, Lantus will be titrated as patient is tolerating p.o. Intake Hypokalemia resolved Medical noncompliance History of depression History of PCO S. Disposition pending tolerance of p.o. intake  LOS: 2 days   Abagale Boulos D 10/11/2011, 10:40  AM

## 2011-10-12 LAB — GLUCOSE, CAPILLARY: Glucose-Capillary: 238 mg/dL — ABNORMAL HIGH (ref 70–99)

## 2011-10-12 MED ORDER — INSULIN GLARGINE 100 UNIT/ML ~~LOC~~ SOLN: 15.0000 [IU] | Freq: Every day | SUBCUTANEOUS | Status: DC

## 2011-10-12 MED ORDER — GLIMEPIRIDE 4 MG PO TABS: 4.0000 mg | ORAL_TABLET | Freq: Every day | ORAL | Status: DC

## 2011-10-12 MED ORDER — CIPROFLOXACIN HCL 500 MG PO TABS: 500.0000 mg | ORAL_TABLET | Freq: Two times a day (BID) | ORAL | Status: AC

## 2011-10-12 NOTE — Discharge Summary (Addendum)
Physician Discharge Summary  Patient ID: [name] MRN: [id number] DOB/AGE: 1970/10/13 40 y.o.  Admit date: 10/09/2011 Discharge date: 10/12/2011  Admission Diagnoses:Abdominal pain, nausea  Discharge Diagnoses:  Principal Problem:  *Pyelonephritis, acute Active Problems:  DM  Polycystic ovaries Medical noncompliance Hypokalemia resolved Obesity Microcytic anemia Depression   Discharged Condition: stable   Chief complaint :Nausea vomiting abdominal pain  History of present illness: Patient presented to the ER with complaint of nausea vomiting abdominal pain, in the ED patient was evaluated, labs revealed leukocytosis fever and abnormal UA. CT of the abdomen and pelvis without any pathology. Admission was deemed necessary for evaluation and treatment. Please see dictated H&P for further details.  Past medical history medications social history past surgical history allergies family history per admission H&P  Hospital Course: Patient was admitted to a medical floor bed for evaluation and treatment of pyelonephritis. She was started on IV fluids IV antibiotics as stated CT of the abdomen and pelvis was ordered. Blood cultures were pending at the time of this dictation, urine culture did show Escherichia coli. Patient's hospital course is one of continued improvement. Fever defervesced, leukocytosis resolved, she was tolerating a full p.o. Intake without nausea vomiting or abdominal pain. She was transitioned to oral antibiotics which she tolerated well. She will complete a total 10 day course of antibiotics. Of note the patient has diabetes which has historically been in poor control without any medical followup. During this hospitalization we discussed importance of taking medication, we've discussed that insulin is needed as well. We've also discussed outpatient diabetes education.  Consults:None  Significant Diagnostic Studies: CAT scan abdomen and pelvis without renal pathology,  UA culture Escherichia coli Hemoglobin A1c 11.7  Studies/Results:  Ct Abdomen Pelvis W Contrast  10/09/2011 *RADIOLOGY REPORT* Clinical Data: Abdominal pain. Fever and chills. Nausea and vomiting. CT ABDOMEN AND PELVIS WITH CONTRAST Technique: Multidetector CT imaging of the abdomen and pelvis was performed following the standard protocol during bolus administration of intravenous contrast. Contrast: 40mL OMNIPAQUE IOHEXOL 300 MG/ML IV SOLN, OMNIPAQUE IOHEXOL 300 MG/ML IV SOLN Comparison: None. Findings: The liver, spleen, pancreas, adrenal glands, and kidneys are normal. The bowel is normal including the terminal ileum and appendix. Uterus and right ovary are normal. 15 mm low density lesion in the left ovary, probably a cyst. No significant osseous abnormalities other than degenerative disc disease at L5-S1 and mild degenerative arthritic changes of the hips. IMPRESSION: No acute abnormality of the abdomen or pelvis. 15 mm cyst in the left ovary. Original Report Authenticated By: Gwynn Burly, M.D.      Discharge Exam: Blood pressure 115/68, pulse 87, temperature 98.3 F (36.8 C), temperature source Oral, resp. rate 19, height 5\' 6"  (1.676 m), weight 108.364 kg (238 lb 14.4 oz), last menstrual period 09/25/2011, SpO2 98.00%. General appearance: alert and cooperative Resp: clear to auscultation bilaterally Cardio: regular rate and rhythm, S1, S2 normal, no murmur, click, rub or gallop GI: soft, non-tender; bowel sounds normal; no masses,  no organomegaly  Disposition: Medically stable for discharge to home   Medication List  As of 10/12/2011  9:57 AM   TAKE these medications         ciprofloxacin 500 MG tablet   Commonly known as: CIPRO   Take 1 tablet (500 mg total) by mouth 2 (two) times daily.      glimepiride 4 MG tablet   Commonly known as: AMARYL   Take 1 tablet (4 mg total) by mouth daily before breakfast.  insulin glargine 100 UNIT/ML injection   Commonly known  as: LANTUS   Inject 15 Units into the skin at bedtime.      metFORMIN 500 MG tablet   Commonly known as: GLUCOPHAGE   Take 1,000 mg by mouth 2 (two) times daily with a meal.      sertraline 25 MG tablet   Commonly known as: ZOLOFT   Take 12.5 mg by mouth every morning.           Follow-up Information    Follow up with Annel Zunker D, MD in 1 week.         SignedRenford Dills D 10/12/2011, 9:57 AM

## 2011-10-16 LAB — CULTURE, BLOOD (ROUTINE X 2)
Culture  Setup Time: 201301220900
Culture: NO GROWTH
Culture: NO GROWTH

## 2011-12-22 ENCOUNTER — Other Ambulatory Visit: Payer: Self-pay

## 2011-12-22 ENCOUNTER — Emergency Department (HOSPITAL_COMMUNITY)
Admission: EM | Admit: 2011-12-22 | Discharge: 2011-12-22 | Disposition: A | Discharge status: Home / Self Care | Payer: 59 | Attending: Emergency Medicine | Admitting: Emergency Medicine

## 2011-12-22 ENCOUNTER — Encounter (HOSPITAL_COMMUNITY): Payer: Self-pay

## 2011-12-22 DIAGNOSIS — I498 Other specified cardiac arrhythmias: Secondary | ICD-10-CM | POA: Insufficient documentation

## 2011-12-22 DIAGNOSIS — E119 Type 2 diabetes mellitus without complications: Secondary | ICD-10-CM | POA: Insufficient documentation

## 2011-12-22 DIAGNOSIS — R002 Palpitations: Secondary | ICD-10-CM | POA: Insufficient documentation

## 2011-12-22 DIAGNOSIS — Z794 Long term (current) use of insulin: Secondary | ICD-10-CM | POA: Insufficient documentation

## 2011-12-22 DIAGNOSIS — I471 Supraventricular tachycardia: Secondary | ICD-10-CM

## 2011-12-22 MED ORDER — ADENOSINE 6 MG/2ML IV SOLN
12.0000 mg | Freq: Once | INTRAVENOUS | Status: AC
  Administered 2011-12-22: 12 mg via INTRAVENOUS

## 2011-12-22 MED ORDER — ADENOSINE 6 MG/2ML IV SOLN
INTRAVENOUS | Status: AC
  Administered 2011-12-22: 12 mg via INTRAVENOUS
  Administered 2011-12-22: 6 mg via INTRAVENOUS
  Filled 2011-12-22: qty 6

## 2011-12-22 MED ORDER — ADENOSINE 6 MG/2ML IV SOLN
6.0000 mg | Freq: Once | INTRAVENOUS | Status: AC
  Administered 2011-12-22: 6 mg via INTRAVENOUS

## 2011-12-22 NOTE — ED Provider Notes (Cosign Needed)
History     CSN: [id number]  Arrival date & time 12/22/11  1356   None     Chief Complaint  Patient presents with  . Tachycardia    (Consider location/radiation/quality/duration/timing/severity/associated sxs/prior treatment) HPI Comments: Pt is 41 year old woman who woke up at 2 A.M. With her "heart pounding."  She rested and it subsided.  Went to work at [employer] this morning and had recurrence of rapid heart rate.  She was seen at [facility] by Armanda Magic, M.D. Who gave her 2.5 mg of metoprolol without relief.  She was therefore sent to [facility] ED for evaluation and treatment.  She denies chest pain and shortness of breath.  She has taken no car trips or airplane trips, has had no recent surgery or immobilization.  She had palpitations about 2 years ago.  Cardiology workup by Dr. Eldridge Dace then with ECHO and Stress test was negative.  Patient is a 41 y.o. female presenting with palpitations. The history is provided by the patient. No language interpreter was used.  Palpitations  This is a new problem. The current episode started 6 to 12 hours ago. Episode frequency: She had an episode last night and one this afternoon.  The problem has not changed since onset.Associated with: No apparent precipitating event. Treatments tried: IV metoprolol. The treatment provided no relief. Risk factors include no known risk factors.    Past Medical History  Diagnosis Date  . Diabetes mellitus     Past Surgical History  Procedure Date  . Tubal ligation   . Cesarean section 2005    History reviewed. No pertinent family history.  History  Substance Use Topics  . Smoking status: Never Smoker   . Smokeless tobacco: Not on file  . Alcohol Use: No    OB History    Grav Para Term Preterm Abortions TAB SAB Ect Mult Living                  Review of Systems  Constitutional: Negative.   HENT: Negative.   Eyes: Negative.   Respiratory: Negative.   Cardiovascular:  Positive for palpitations.  Gastrointestinal: Negative.   Genitourinary: Negative.   Musculoskeletal: Negative.   Skin: Negative.   Neurological: Negative.   Psychiatric/Behavioral: Negative.     Allergies  Review of patient's allergies indicates no known allergies.  Home Medications   Current Outpatient Rx  Name Route Sig Dispense Refill  . GLIMEPIRIDE 4 MG PO TABS Oral Take 4 mg by mouth 2 (two) times daily.    . INSULIN GLARGINE 100 UNIT/ML [place] SOLN Subcutaneous Inject 32 Units into the skin at bedtime.    Marland Kitchen METFORMIN HCL 500 MG PO TABS Oral Take 1,000 mg by mouth 2 (two) times daily with a meal.      BP 102/54  Temp(Src) 99.1 F (37.3 C) (Oral)  Resp 20  SpO2 100%  Physical Exam  Constitutional: She appears well-developed and well-nourished. No distress.  HENT:  Head: Normocephalic and atraumatic.  Right Ear: External ear normal.  Left Ear: External ear normal.  Mouth/Throat: Oropharynx is clear and moist.  Eyes: Conjunctivae and EOM are normal. Pupils are equal, round, and reactive to light.  Neck: Normal range of motion. Neck supple. No thyromegaly present.  Cardiovascular:       Tachycardia of about 160, regular to auscultation.   Pulmonary/Chest: Effort normal and breath sounds normal. No respiratory distress.  Abdominal: Soft. Bowel sounds are normal.  Musculoskeletal: Normal range of motion.  She exhibits no edema.       No calf tenderness, no Homans' sign.   Skin: Skin is warm and dry.  Psychiatric: She has a normal mood and affect. Her behavior is normal.    ED Course  Procedures (including critical care time)   Date: 12/22/2011  Rate: 162  Rhythm: supraventricular tachycardia (SVT)  QRS Axis: normal  Intervals: normal  ST/T Wave abnormalities: normal  Conduction Disutrbances:none  Narrative Interpretation: Abnormal EKG--supraventricular tachycardia  Old EKG Reviewed: changes noted--rate was 134 on 10/09/2011.    Pt placed on monitor, External  pacemaker paddles applied.  Pt advised that we were going to give her adenosine to slow her heart.  She gave consent for this to be done in the presence of her mother and the nurses.  Pt was givin 6 mg adenosine without benefit.  She was then given 12 mg of adenosine with heart rate slowing to 110.  She tolerated this procedure well.   She was subsequently seen by her cardiologist, Dr. Eldridge Dace, who prescribed Cardizem for her.  She was then released.  1. Supraventricular tachycardia            Carleene Cooper III, MD 12/22/11 2059

## 2011-12-22 NOTE — Discharge Instructions (Signed)
Mrs. [name], you had supraventricular tachycardia today.  You were treated with the intravenous medicine called adenosine.  The 12 mg dose of adenosine slowed your heartbeat.  You had consultation with Dr. Everette Rank, Cardiologist.  He electronically sent a prescription to your pharmacy in [place].  It is safe to go home, now that your rhythm is back to a reasonable rate.

## 2011-12-22 NOTE — ED Notes (Signed)
Sent from [facility] cardiology with symptoms of heart racing, pt in SVT. Non symptomatic, 180 on arrival to md office given 2.5 mg of lopressor and no change in hr, here on arrival hr is 160 and regular.

## 2011-12-22 NOTE — Consult Note (Signed)
Admit date: 12/22/2011 Referring Physician  Dr. Ignacia Palma Primary Physician  Dr. Nehemiah Settle Primary Cardiologist  Eldridge Dace Reason for Consultation  SVT  HPI: 41 y/o with DM who noticed palpitations starting this morning.  She was found to have a heart rate of 162 bpm.  She went to the cardiology office and IV lopressor was given with no relief of the SVT.  She was sent to the ER.  I discussed the case with Dr. Ignacia Palma and we decided to give her IV adenosine.  She converted to sinus tachycardia with the second dose of adenosine (12mg ). She feels well.  No lightheadedness.  No chest pain.  No syncope.       PMH:   Past Medical History  Diagnosis Date  . Diabetes mellitus      PSH:   Past Surgical History  Procedure Date  . Tubal ligation   . Cesarean section 2005    Allergies:  Review of patient's allergies indicates no known allergies. Prior to Admit Meds:   (Not in a hospital admission) Fam HX:   History reviewed. No pertinent family history. Social HX:    History   Social History  . Marital Status: Single    Spouse Name: N/A    Number of Children: N/A  . Years of Education: N/A   Occupational History  . Not on file.   Social History Main Topics  . Smoking status: Never Smoker   . Smokeless tobacco: Not on file  . Alcohol Use: No  . Drug Use: No  . Sexually Active:    Other Topics Concern  . Not on file   Social History Narrative  . No narrative on file     ROS:  All 11 ROS were addressed and are negative except what is stated in the HPI  Physical Exam: Blood pressure 102/54, temperature 99.1 F (37.3 C), temperature source Oral, resp. rate 20, SpO2 100.00%.  General: Well developed, well nourished, in no acute distress Head:  Normal cephalic and atramatic  Lungs:   Clear bilaterally to auscultation and percussion. Heart:   HRRR S1 S2 Abdomen:  abdomen soft and non-tender  Extremities:   No  edema.   Neuro: Alert and oriented X 3. Psych:  Good affect,  responds appropriately    Labs:   Lab Results  Component Value Date   WBC 8.8 10/11/2011   HGB 10.0* 10/11/2011   HCT 32.3* 10/11/2011   MCV 75.6* 10/11/2011   PLT 253 10/11/2011   No results found for this basename: NA,K,CL,CO2,BUN,CREATININE,CALCIUM,LABALBU,PROT,BILITOT,ALKPHOS,ALT,AST,GLUCOSE in the last 168 hours No results found for this basename: PTT   No results found for this basename: INR, PROTIME   No results found for this basename: CKTOTAL, CKMB, CKMBINDEX, TROPONINI     Lab Results  Component Value Date   CHOL 153 01/10/2010   Lab Results  Component Value Date   HDL 33.00* 01/10/2010   Lab Results  Component Value Date   LDLCALC 94 01/10/2010   Lab Results  Component Value Date   TRIG 132.0 01/10/2010   Lab Results  Component Value Date   CHOLHDL 5 01/10/2010   No results found for this basename: LDLDIRECT      Radiology:  No results found.  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  Will start low dose diltiazem.  If she does not tolerate this, could try beta blocker in the future.  If arrhythmia  recurs, could consider EP referral and ablation.  Corky Crafts., MD  12/22/2011  3:01 PM

## 2012-03-26 ENCOUNTER — Other Ambulatory Visit: Payer: Self-pay | Admitting: Internal Medicine

## 2012-03-26 DIAGNOSIS — Z1231 Encounter for screening mammogram for malignant neoplasm of breast: Secondary | ICD-10-CM

## 2012-04-11 ENCOUNTER — Ambulatory Visit: Payer: 59

## 2013-08-06 ENCOUNTER — Telehealth: Payer: Self-pay | Admitting: Interventional Cardiology

## 2013-08-06 NOTE — Telephone Encounter (Signed)
New Problem  Pt requests a dosage change of the medication Dilatezem for a cheaper price at the pharmacy. Please call back to discuss further

## 2013-08-06 NOTE — Telephone Encounter (Signed)
Okay to change dose to diltiazem 120 mg by mouth twice a day.

## 2013-08-06 NOTE — Telephone Encounter (Signed)
Called stating she is taking Diltiazem 240 mg daily.  She has found that [facility] in [place] (only one) will give her Diltiazem 120 mg to take BID or 2 in am for Free.  She wondered if Dr. Eldridge Dace would consider changing dosage to 120 mg so she could get it free.  Advised that both Amy and Dr. Eldridge Dace were out of the office today but would forward message to them.

## 2013-08-07 MED ORDER — DILTIAZEM HCL ER COATED BEADS 120 MG PO CP24: ORAL_CAPSULE | ORAL | Status: DC

## 2013-08-07 NOTE — Telephone Encounter (Signed)
Refilled and pt is aware. 

## 2013-08-07 NOTE — Addendum Note (Signed)
Addended byOrlene Plum H on: 08/07/2013 08:32 AM   Modules accepted: Orders

## 2013-08-20 ENCOUNTER — Telehealth: Payer: Self-pay | Admitting: Interventional Cardiology

## 2013-08-20 ENCOUNTER — Other Ambulatory Visit: Payer: Self-pay | Admitting: *Deleted

## 2013-08-20 NOTE — Telephone Encounter (Signed)
Jeremy, please advise.  

## 2013-08-20 NOTE — Telephone Encounter (Signed)
See Cardizem added below, which is the one pt can get for $7. Is 120 mg BID enough for pt or would she need 2 cap BID? This would cost her $14.

## 2013-08-20 NOTE — Telephone Encounter (Deleted)
Error:  Transferred pt to pt advocate team for refill.

## 2013-08-20 NOTE — Telephone Encounter (Signed)
If she wanted the plain diltiazem hydrochloride tablets (which is short acting), she would need diltiazem 60 mg tablets, and take this 4 times daily.  The plain diltiazem doesn't last long enough to take only twice daily.

## 2013-08-20 NOTE — Telephone Encounter (Signed)
Patient called and said that the diltiazem was sent in wrong. She wants the plain diltiazem not the cardizem cd. Please advise. Thanks, MI

## 2013-08-21 MED ORDER — VERAPAMIL HCL ER 240 MG PO TBCR: 240.0000 mg | EXTENDED_RELEASE_TABLET | Freq: Every day | ORAL | Status: DC

## 2013-08-21 NOTE — Telephone Encounter (Signed)
Lm yesterday for pt to call me back to go over options regarding diltiazem. Spoke with Riki Rusk our pharmacist and Verapamil would be an option.

## 2013-08-21 NOTE — Telephone Encounter (Signed)
Spoke with pt and she would like to try Verapamil 240 mg. Riki Rusk stated this would be another option and may be cheap enough. FYI to Dr. Eldridge Dace.Rx sent into Pharmacy.

## 2013-08-21 NOTE — Telephone Encounter (Signed)
Lm for pt to r/c

## 2013-09-22 ENCOUNTER — Other Ambulatory Visit: Payer: Self-pay

## 2013-09-22 DIAGNOSIS — Z1231 Encounter for screening mammogram for malignant neoplasm of breast: Secondary | ICD-10-CM

## 2013-10-02 ENCOUNTER — Other Ambulatory Visit (HOSPITAL_COMMUNITY)
Admission: RE | Admit: 2013-10-02 | Discharge: 2013-10-02 | Disposition: A | Discharge status: Home / Self Care | Payer: 59 | Source: Ambulatory Visit | Attending: Nurse Practitioner | Admitting: Nurse Practitioner

## 2013-10-02 ENCOUNTER — Other Ambulatory Visit: Payer: Self-pay | Admitting: Nurse Practitioner

## 2013-10-02 DIAGNOSIS — Z1151 Encounter for screening for human papillomavirus (HPV): Secondary | ICD-10-CM | POA: Insufficient documentation

## 2013-10-02 DIAGNOSIS — Z01419 Encounter for gynecological examination (general) (routine) without abnormal findings: Secondary | ICD-10-CM | POA: Insufficient documentation

## 2013-10-14 ENCOUNTER — Ambulatory Visit
Admission: RE | Admit: 2013-10-14 | Discharge: 2013-10-14 | Disposition: A | Discharge status: Home / Self Care | Payer: 59 | Source: Ambulatory Visit

## 2013-10-14 DIAGNOSIS — Z1231 Encounter for screening mammogram for malignant neoplasm of breast: Secondary | ICD-10-CM

## 2014-01-16 NOTE — Telephone Encounter (Signed)
error 

## 2014-02-24 ENCOUNTER — Encounter: Payer: Self-pay | Admitting: Interventional Cardiology

## 2014-03-24 ENCOUNTER — Ambulatory Visit: Payer: 59 | Admitting: Interventional Cardiology

## 2014-04-15 ENCOUNTER — Encounter: Payer: Self-pay | Admitting: *Deleted

## 2014-05-14 ENCOUNTER — Encounter: Payer: Self-pay | Admitting: Interventional Cardiology

## 2014-05-14 ENCOUNTER — Ambulatory Visit (INDEPENDENT_AMBULATORY_CARE_PROVIDER_SITE_OTHER): Payer: 59 | Admitting: Interventional Cardiology

## 2014-05-14 VITALS — BP 130/62 | HR 100 | Ht 70.0 in | Wt 260.0 lb

## 2014-05-14 DIAGNOSIS — I471 Supraventricular tachycardia, unspecified: Secondary | ICD-10-CM | POA: Insufficient documentation

## 2014-05-14 DIAGNOSIS — R0789 Other chest pain: Secondary | ICD-10-CM

## 2014-05-14 DIAGNOSIS — I498 Other specified cardiac arrhythmias: Secondary | ICD-10-CM

## 2014-05-14 NOTE — Patient Instructions (Signed)
Your physician recommends that you continue on your current medications as directed. Please refer to the Current Medication list given to you today.  Your physician wants you to follow-up in: 1 year with Dr. Varanasi. You will receive a reminder letter in the mail two months in advance. If you don't receive a letter, please call our office to schedule the follow-up appointment.  

## 2014-05-14 NOTE — Progress Notes (Signed)
Patient ID: [name], female   DOB: Jan 06, 1971, 43 y.o.   MRN: [id number]    [address], [address] [place], [place]  [address] Phone: [phone] Fax:  [phone]  Date:  05/14/2014   ID:  Avacyn Kloosterman, DOB 07-Mar-1971, MRN [id number]  PCP:  Kandice Hams, MD      History of Present Illness: [name] is a 43 y.o. female who has had SVT and has been treated with Diltiazem. She was in the ER several years ago and converted to NSR. She has had some palpitations with emotional stress. She has some occasional SHOB. She has had ocasional sharp CP.  THe Cp has been getting more frequent of late, associated with increased cough.  Prior to the coughing, her CP was less.  She has noticed her HR has been up to 120-150 at times.  Usual HR is in the 89-100 range.     Wt Readings from Last 3 Encounters:  05/14/14 260 lb (117.935 kg)  10/12/11 238 lb 14.4 oz (108.364 kg)  01/10/10 253 lb 6.1 oz (114.933 kg)     Past Medical History  Diagnosis Date  . Diabetes mellitus     Current Outpatient Prescriptions  Medication Sig Dispense Refill  . ALPRAZolam (XANAX) 1 MG tablet Take 1 mg by mouth at bedtime as needed for anxiety.      Marland Kitchen diltiazem (CARDIZEM CD) 240 MG 24 hr capsule Take 240 mg by mouth.      Marland Kitchen glimepiride (AMARYL) 4 MG tablet Take 4 mg by mouth.      . metFORMIN (GLUCOPHAGE) 500 MG tablet Take 1,000 mg by mouth 2 (two) times daily with a meal.      . verapamil (CALAN-SR) 240 MG CR tablet Take 1 tablet (240 mg total) by mouth at bedtime.  30 tablet  11   No current facility-administered medications for this visit.    Allergies:    Allergies  Allergen Reactions  . Latex     Social History:  The patient  reports that she has never smoked. She does not have any smokeless tobacco history on file. She reports that she does not drink alcohol or use illicit drugs.   Family History:  The patient's Family history is unknown by patient.   ROS:  Please see the history  of present illness.  No nausea, vomiting.  No fevers, chills.  No focal weakness.  No dysuria. Back and neck pain -stabbing-periodic.   All other systems reviewed and negative.   PHYSICAL EXAM: VS:  BP 130/62  Pulse 100  Ht 5\' 10"  (1.778 m)  Wt 260 lb (117.935 kg)  BMI 37.31 kg/m2 Well nourished, well developed, in no acute distress HEENT: normal Neck: no JVD, no carotid bruits Cardiac:  normal S1, S2; RRR;  Lungs:  clear to auscultation bilaterally, no wheezing, rhonchi or rales Abd: soft, nontender, no hepatomegaly Ext: tr bilateral edema Skin: warm and dry Neuro:   no focal abnormalities noted  EKG:  Sinus tachycadia, No ST segment changes    ASSESSMENT AND PLAN:  Supraventricular tachycardia  IMAGING: EKG    Harward,Amy 03/18/2013 01:37:50 PM > Darrold Bezek,JAY 03/18/2013 01:51:19 PM > sinus tachycardia, no ST segment changes   Notes: She had an episode of SVT that landed her in the ER in 2013. SHe has not had any more severe episodes like this. Her resting heart rate has been high. Echo last year was normal.    Chest pain: Atypical .  Pain  in the back to palpitation.  Likely muscular pain related to coughing.  ECG normal.  Of note, she has been on meds for Type 2 DM- Insulin in the last 2 years.   LDL above target given her DM.  She will have lipid panel with Dr. Buddy Duty.  If LDL > 100, would start atorvastatin 10 mg daily.    She is requesting a one time prescription for alprazolam until she can see her PMD.  Preventive Medicine  Adult topics discussed:  Diet: healthy diet, low calorie, low fat.  Exercise: 5 days a week, at least 30 minutes of aerobic exercise.      Signed, Mina Marble, MD, [facility] 05/14/2014 2:13 PM

## 2014-08-28 ENCOUNTER — Other Ambulatory Visit: Payer: Self-pay | Admitting: Interventional Cardiology

## 2015-07-15 ENCOUNTER — Ambulatory Visit
Admission: RE | Admit: 2015-07-15 | Discharge: 2015-07-15 | Disposition: A | Discharge status: Home / Self Care | Payer: 59 | Source: Ambulatory Visit

## 2015-07-15 ENCOUNTER — Other Ambulatory Visit: Payer: Self-pay

## 2015-07-15 DIAGNOSIS — Z1231 Encounter for screening mammogram for malignant neoplasm of breast: Secondary | ICD-10-CM

## 2015-09-02 ENCOUNTER — Other Ambulatory Visit: Payer: Self-pay | Admitting: *Deleted

## 2015-09-02 MED ORDER — VERAPAMIL HCL ER 240 MG PO TBCR: 240.0000 mg | EXTENDED_RELEASE_TABLET | Freq: Every day | ORAL | Status: DC

## 2015-10-26 ENCOUNTER — Other Ambulatory Visit: Payer: Self-pay | Admitting: Interventional Cardiology

## 2015-11-25 ENCOUNTER — Other Ambulatory Visit: Payer: Self-pay | Admitting: Interventional Cardiology

## 2015-11-28 ENCOUNTER — Emergency Department (HOSPITAL_COMMUNITY)
Admission: EM | Admit: 2015-11-28 | Discharge: 2015-11-28 | Disposition: A | Discharge status: Home / Self Care | Payer: 59 | Attending: Emergency Medicine | Admitting: Emergency Medicine

## 2015-11-28 ENCOUNTER — Encounter (HOSPITAL_COMMUNITY): Payer: Self-pay | Admitting: Family Medicine

## 2015-11-28 DIAGNOSIS — I4891 Unspecified atrial fibrillation: Secondary | ICD-10-CM | POA: Diagnosis not present

## 2015-11-28 DIAGNOSIS — E119 Type 2 diabetes mellitus without complications: Secondary | ICD-10-CM | POA: Diagnosis not present

## 2015-11-28 DIAGNOSIS — L089 Local infection of the skin and subcutaneous tissue, unspecified: Secondary | ICD-10-CM

## 2015-11-28 DIAGNOSIS — Z7984 Long term (current) use of oral hypoglycemic drugs: Secondary | ICD-10-CM | POA: Diagnosis not present

## 2015-11-28 DIAGNOSIS — R2241 Localized swelling, mass and lump, right lower limb: Secondary | ICD-10-CM | POA: Diagnosis present

## 2015-11-28 DIAGNOSIS — Z91040 Latex allergy status: Secondary | ICD-10-CM | POA: Insufficient documentation

## 2015-11-28 DIAGNOSIS — Z79899 Other long term (current) drug therapy: Secondary | ICD-10-CM | POA: Insufficient documentation

## 2015-11-28 HISTORY — DX: Unspecified atrial fibrillation: I48.91

## 2015-11-28 LAB — CBC WITH DIFFERENTIAL/PLATELET
BASOS ABS: 0 10*3/uL (ref 0.0–0.1)
BASOS PCT: 0 %
EOS ABS: 0.2 10*3/uL (ref 0.0–0.7)
Eosinophils Relative: 2 %
HCT: 39 % (ref 36.0–46.0)
HEMOGLOBIN: 11.7 g/dL — AB (ref 12.0–15.0)
Lymphocytes Relative: 21 %
Lymphs Abs: 1.7 10*3/uL (ref 0.7–4.0)
MCH: 22.4 pg — ABNORMAL LOW (ref 26.0–34.0)
MCHC: 30 g/dL (ref 30.0–36.0)
MCV: 74.7 fL — ABNORMAL LOW (ref 78.0–100.0)
MONOS PCT: 9 %
Monocytes Absolute: 0.8 10*3/uL (ref 0.1–1.0)
NEUTROS ABS: 5.6 10*3/uL (ref 1.7–7.7)
NEUTROS PCT: 68 %
Platelets: 399 10*3/uL (ref 150–400)
RBC: 5.22 MIL/uL — ABNORMAL HIGH (ref 3.87–5.11)
RDW: 16.3 % — AB (ref 11.5–15.5)
WBC: 8.3 10*3/uL (ref 4.0–10.5)

## 2015-11-28 LAB — COMPREHENSIVE METABOLIC PANEL
ALBUMIN: 3.5 g/dL (ref 3.5–5.0)
ALK PHOS: 83 U/L (ref 38–126)
ALT: 14 U/L (ref 14–54)
ANION GAP: 9 (ref 5–15)
AST: 14 U/L — AB (ref 15–41)
BUN: 7 mg/dL (ref 6–20)
CO2: 24 mmol/L (ref 22–32)
Calcium: 9.1 mg/dL (ref 8.9–10.3)
Chloride: 102 mmol/L (ref 101–111)
Creatinine, Ser: 0.52 mg/dL (ref 0.44–1.00)
GFR calc Af Amer: >60 mL/min (ref >60)
GFR calc non Af Amer: >60 mL/min (ref >60)
GLUCOSE: 311 mg/dL — AB (ref 65–99)
POTASSIUM: 3.9 mmol/L (ref 3.5–5.1)
SODIUM: 135 mmol/L (ref 135–145)
Total Bilirubin: 0.3 mg/dL (ref 0.3–1.2)
Total Protein: 8.3 g/dL — ABNORMAL HIGH (ref 6.5–8.1)

## 2015-11-28 MED ORDER — SODIUM CHLORIDE 0.9 % IV SOLN
Freq: Once | INTRAVENOUS | Status: AC
  Administered 2015-11-28: 22:00:00 via INTRAVENOUS

## 2015-11-28 MED ORDER — DEXTROSE 5 % IV SOLN
1.0000 g | Freq: Once | INTRAVENOUS | Status: AC
  Administered 2015-11-28: 1 g via INTRAVENOUS
  Filled 2015-11-28: qty 10

## 2015-11-28 MED ORDER — CEPHALEXIN 500 MG PO CAPS: 500.0000 mg | ORAL_CAPSULE | Freq: Four times a day (QID) | ORAL | Status: DC

## 2015-11-28 MED ORDER — SULFAMETHOXAZOLE-TRIMETHOPRIM 800-160 MG PO TABS: 1.0000 | ORAL_TABLET | Freq: Two times a day (BID) | ORAL | Status: AC

## 2015-11-28 NOTE — ED Notes (Signed)
Patient has an abscess above her vaginal region. Pt reports it is swollen, red, tender, and having blood tinged discharge. Pt reports in the center, about the size of a quater, is black tissue. Denies fever.

## 2015-11-28 NOTE — Discharge Instructions (Signed)

## 2015-11-28 NOTE — ED Provider Notes (Signed)
CSN: [id number]     Arrival date & time 11/28/15  1836 History  By signing my name below, I, [name], attest that this documentation has been prepared under the direction and in the presence of Ane Conerly K. Eliana Lueth, PA-C. Electronically Signed: [name], ED Scribe. 11/28/2015. 8:17 PM.    Chief Complaint  Patient presents with  . Abscess   The history is provided by the patient. No language interpreter was used.   HPI Comments: [name] is a 45 y.o. female with h/o DM, Afib who presents to the Emergency Department complaining of a moderate, gradually worsening area of pain and swelling to the right groin onset this week with associated drainage, surrounding erythema. Pt notes that she recently had an oral abscess last month and was prescribed Penicillin with complete resolution of abscess. She states she finished this course and began to have a fever and diarrhea. Pt states she was then prescribed Cipro d/t suspicion of Cdiff and finished her course yesterday.  Pt states she takes insulin and pills for DM, but does not keep up with her medication very well. NKDA. PCP is Dr. Sheryn Bison. She denies fever, chills, abdominal pain, nausea, emesis, diarrhea.   Past Medical History  Diagnosis Date  . Diabetes mellitus   . Atrial fibrillation [facility])    Past Surgical History  Procedure Laterality Date  . Tubal ligation    . Cesarean section  2005   Family History  Problem Relation Age of Onset  . Hypertension Mother   . Diabetes Mother   . Asthma Mother   . CAD Father   . Diabetes Sister   . Diabetes Brother   . Hypertension Brother    Social History  Substance Use Topics  . Smoking status: Never Smoker   . Smokeless tobacco: None  . Alcohol Use: No   OB History    No data available     Review of Systems  Constitutional: Negative for fever and chills.  Gastrointestinal: Negative for nausea, vomiting, abdominal pain and diarrhea.  Skin: Positive for wound.  All other systems  reviewed and are negative.  Allergies  Latex  Home Medications   Prior to Admission medications   Medication Sig Start Date End Date Taking? Authorizing Provider  ALPRAZolam Duanne Moron) 1 MG tablet Take 1 mg by mouth at bedtime as needed for anxiety.    Historical Provider, MD  glimepiride (AMARYL) 4 MG tablet Take 4 mg by mouth daily with breakfast.     Historical Provider, MD  metFORMIN (GLUCOPHAGE) 500 MG tablet Take 1,000 mg by mouth 2 (two) times daily with a meal.    Historical Provider, MD  verapamil (CALAN-SR) 240 MG CR tablet TAKE 1 TABLET BY MOUTH AT BEDTIME 11/25/15   Jettie Booze, MD   BP 141/85 mmHg  Pulse 112  Temp(Src) 98.6 F (37 C) (Oral)  Resp 16  Ht 5\' 8"  (1.727 m)  Wt 245 lb (111.131 kg)  BMI 37.26 kg/m2  SpO2 100%  LMP 11/13/2015 Physical Exam  Constitutional: She is oriented to person, place, and time. She appears well-developed and well-nourished.  HENT:  Head: Normocephalic and atraumatic.  Eyes: Conjunctivae and EOM are normal. Pupils are equal, round, and reactive to light.  Neck: Normal range of motion. Neck supple.  Cardiovascular: Normal rate.   Pulmonary/Chest: Effort normal. No respiratory distress.  Abdominal: She exhibits no distension.  Genitourinary:  20x25cm area of erythema with a 4x5 cm eschar in the center. Actively draining.  Musculoskeletal: Normal range of motion.  Neurological: She is alert and oriented to person, place, and time.  Skin: Skin is warm and dry.  Psychiatric: She has a normal mood and affect. Her behavior is normal.  Nursing note and vitals reviewed.   ED Course  Procedures (including critical care time) DIAGNOSTIC STUDIES: Oxygen Saturation is 100% on RA, normal by my interpretation.    COORDINATION OF CARE: 8:16 PM Discussed treatment plan with pt at bedside which includes lab work and pt agreed to plan.   Labs Review Labs Reviewed - No data to display I have personally reviewed and evaluated these lab  results as part of my medical decision-making.   MDM  Pt given Iv rocephin.    Labs obtained.  Pt has elevated glucose but normal wbc's on cbc.   I will treat pt with keflex and bactrim.   Pt advised warm compresses.  Pt advised to see her Md for recheck in 2 days.  Return if any problems.    Final diagnoses:  Skin infection    Patient with skin abscess. Incision and drainage not indicated d/t active drainage from the abscess. The patient appears reasonably screened and/or stabilized for discharge and I doubt any other emergent medical condition requiring further screening, evaluation, or treatment in the ED prior to discharge. Conservative therapies discussed and recommended. Patient advised to follow up with PCP as needed, or with worsening symptoms. Patient appears stable for discharge at this time. Return precautions discussed and outlined in discharge paperwork. Patient is agreeable to plan.    Meds ordered this encounter  Medications  . cefTRIAXone (ROCEPHIN) 1 g in dextrose 5 % 50 mL IVPB    Sig:     Order Specific Question:  Antibiotic Indication:    Answer:  Cellulitis  . 0.9 %  sodium chloride infusion    Sig:   . Dapagliflozin-Metformin HCl ER (XIGDUO XR) 06-999 MG TB24    Sig: Take 2 tablets by mouth daily.  . Albiglutide 50 MG PEN    Sig: Inject 50 mg into the skin every 7 (seven) days.  . Insulin Degludec 200 UNIT/ML SOPN    Sig: Inject 150 Units into the skin daily.  Marland Kitchen ibuprofen (ADVIL,MOTRIN) 200 MG tablet    Sig: Take 800 mg by mouth every 6 (six) hours as needed for moderate pain.  . naproxen sodium (ANAPROX) 220 MG tablet    Sig: Take 440 mg by mouth 2 (two) times daily as needed (pain).  . cephALEXin (KEFLEX) 500 MG capsule    Sig: Take 1 capsule (500 mg total) by mouth 4 (four) times daily.    Dispense:  40 capsule    Refill:  0    Order Specific Question:  Supervising Provider    Answer:  Bertell Maria  . sulfamethoxazole-trimethoprim (BACTRIM  DS,SEPTRA DS) 800-160 MG tablet    Sig: Take 1 tablet by mouth 2 (two) times daily.    Dispense:  20 tablet    Refill:  0    Order Specific Question:  Supervising Provider    Answer:  Alfonzo Beers [[id number]]    An After Visit Summary was printed and given to the patient.  Hollace Kinnier Hornitos, PA-C 11/28/15 2239  Charlesetta Shanks, MD 12/01/15 1534

## 2016-02-16 ENCOUNTER — Other Ambulatory Visit: Payer: Self-pay | Admitting: Interventional Cardiology

## 2016-02-17 NOTE — Telephone Encounter (Signed)
Please refer refills to the pts PCP. Thanks.

## 2016-02-17 NOTE — Telephone Encounter (Signed)
Should this request be refused and deferred to patients pcp? Please advise. Thanks, MI

## 2016-12-27 ENCOUNTER — Encounter: Payer: Self-pay | Admitting: Podiatry

## 2016-12-27 ENCOUNTER — Ambulatory Visit (INDEPENDENT_AMBULATORY_CARE_PROVIDER_SITE_OTHER): Payer: PRIVATE HEALTH INSURANCE | Admitting: Podiatry

## 2016-12-27 ENCOUNTER — Ambulatory Visit (INDEPENDENT_AMBULATORY_CARE_PROVIDER_SITE_OTHER): Payer: PRIVATE HEALTH INSURANCE

## 2016-12-27 VITALS — BP 107/84 | HR 97 | Resp 16 | Ht 66.0 in | Wt 232.0 lb

## 2016-12-27 DIAGNOSIS — S99922A Unspecified injury of left foot, initial encounter: Secondary | ICD-10-CM

## 2016-12-27 DIAGNOSIS — L97501 Non-pressure chronic ulcer of other part of unspecified foot limited to breakdown of skin: Secondary | ICD-10-CM | POA: Diagnosis not present

## 2016-12-27 DIAGNOSIS — S92301A Fracture of unspecified metatarsal bone(s), right foot, initial encounter for closed fracture: Secondary | ICD-10-CM

## 2016-12-27 NOTE — Progress Notes (Signed)
   Subjective:    Patient ID: [name], female    DOB: 1971-03-02, 46 y.o.   MRN: [id number]  HPI Chief Complaint  Patient presents with  . Wound Check    Left foot; great toe-bottom of toe; x3 weeks  . Foot Injury    Left foot; lateral side; pt stated, "Hurt foot last Thursday, April 5th when walking to the elevator; went to [facility]-in and was told foot was broken"      Review of Systems  Constitutional: Positive for fatigue.  HENT: Positive for sneezing.   Cardiovascular: Positive for palpitations.  Gastrointestinal: Positive for nausea.  Endocrine: Positive for heat intolerance, polydipsia and polyuria.  Genitourinary: Positive for frequency.  Musculoskeletal: Positive for arthralgias, back pain, gait problem and myalgias.  Skin: Positive for wound.  All other systems reviewed and are negative.      Objective:   Physical Exam        Assessment & Plan:

## 2016-12-27 NOTE — Progress Notes (Signed)
Subjective:     Patient ID: [name], female   DOB: 09-24-1970, 46 y.o.   MRN: [id number]  HPI patient presents stating that she felt a snap in her left foot 6 days ago and she's developed a lot of swelling on the outside and has been diagnosed with a fracture. She also complains about her left big toe having a previous ulceration which is healing but is still present. Her sugars have been running relatively high that she's been working on it much better over the last several weeks   Review of Systems  All other systems reviewed and are negative.      Objective:   Physical Exam  Constitutional: She is oriented to person, place, and time.  Cardiovascular: Intact distal pulses.   Musculoskeletal: Normal range of motion.  Neurological: She is oriented to person, place, and time.  Skin: Skin is warm and dry.  Nursing note and vitals reviewed.  patient's vascular status was found to be intact both DP PT pulses and patient had good digital perfusion. Patient neurologically is mildly compromised with diminished sharp dull and vibratory. I noted there to be quite a bit of edema on the lateral side of the left foot around the base of the fifth metatarsal and there is a healing ulceration of the left hallux that has superficial crust formed but no subcutaneous exposure and no proximal edema erythema or drainage noted.     Assessment:     Fracture fifth metatarsal left base of a Jones nature and healing ulceration of the left big toe and a diabetic who is not controlled her sugar as well as she should    Plan:     H&P and x-rays reviewed. She did show me her last blood work that was done 5 days ago indicating her sugar was 300 but she stated she had just had a medial and all other numbers were relatively intact. I reviewed this case with Dr. Jacqualyn Posey and more both in agreement that this fifth metatarsal will need to be fixated and hopefully can be done percutaneously. This is a relative  emergency and we have ordered an A1c for this patient and will look at results but hopefully she will have surgery in the next several weeks. I went ahead today and I did debride the ulceration left big toe I flushed the area and applied Iodosorb with dressing and it should heal uneventfully and I advised her to stay in her cam walker until she is seen by Dr. Jacqualyn Posey next Friday with anticipation of surgery in the next 2 weeks  X-ray report indicates there is a fracture of the fifth metatarsal of a Jones nature with quite a bit of separation on the lateral side

## 2017-01-05 ENCOUNTER — Telehealth: Payer: Self-pay | Admitting: *Deleted

## 2017-01-05 ENCOUNTER — Ambulatory Visit: Payer: PRIVATE HEALTH INSURANCE

## 2017-01-05 ENCOUNTER — Encounter: Payer: Self-pay | Admitting: Podiatry

## 2017-01-05 ENCOUNTER — Ambulatory Visit (INDEPENDENT_AMBULATORY_CARE_PROVIDER_SITE_OTHER): Payer: PRIVATE HEALTH INSURANCE | Admitting: Podiatry

## 2017-01-05 DIAGNOSIS — S92301D Fracture of unspecified metatarsal bone(s), right foot, subsequent encounter for fracture with routine healing: Secondary | ICD-10-CM | POA: Diagnosis not present

## 2017-01-05 DIAGNOSIS — L97501 Non-pressure chronic ulcer of other part of unspecified foot limited to breakdown of skin: Secondary | ICD-10-CM

## 2017-01-05 NOTE — Telephone Encounter (Addendum)
Pt states her A1C is 13.2 and she will be getting the bone grown stimulator from Dr. French Ana.01/10/2017-Pt states Dr.Wagoner is going to perform surgery for a Jones Fracture. Pt states was put on desk duty by orthopedist, but on the recommendations of Dr. Jacqualyn Posey and Dr. 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 Pt states she is using ibuprofen while at work and would like to have a pain medication for night when the foot hurts worse.01/11/2017-Left message informing pt of Dr.Wagoner's orders, and instructed pt to pick up the rx in the [place] office.

## 2017-01-08 NOTE — Progress Notes (Signed)
Subjective: 46 year old female presents the office today for follow up evaluation of left fifth metatarsal base fracture. She's remain in the cam boot. 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 She states that her A1c was 13 something that her blood sugars have been better controlled between 130 to 170. She states the areas still tender. She states that a bone similar has been ordered for her but she is not to receive this. She also states that the plantar big toe is doing better. Denies any systemic complaints such as fevers, chills, nausea, vomiting. No acute changes since last appointment, and no other complaints at this time.   Objective: AAO x3, NAD DP/PT pulses palpable bilaterally, CRT less than 3 seconds This continuation tenderness left fifth metatarsal base and there is localized edema to this area the any significant erythema or increased warmth. There is no other area pinpoint bony tenderness or pain the vibratory sensation. Superficial granular wound present on the plantar aspect of the left hallux. There is no probing to bone undermining or tunneling. Is no surrounding erythema, ascending cellulitis. There is no fluctuance, crepitus, malodor. There is no other open lesions or pre-ulcerative lesions at this time. There is no pain with calf compression, swelling, warmth, erythema.   Assessment: 46 year old female left fifth metatarsal base fracture, healing ulceration left hallux   Plan: -Treatment options discussed including all alternatives, risks, and complications -Etiology of symptoms were discussed -Previous x-rays were reviewed with the patient.  -I long discussion the patient in regards to treatment options for the fracture. I do believe that ORIF of the fracture hopefully percutaneous as much as possible would be beneficial for her however given her elevated A1c this puts her great risk of increase of  complications. She also has a wound on the left hallux which is slow to heal but is healing. As her blood sugars have been better controlled she can keep her blood sugars under better control and keep a log of this she may be a better surgical candidate. For now I recommend continuing the CAM boot at this time he tries the offer foot as much as possible. I will see her back in 2 weeks and repeat x-rays to status any evidence of healing. She is also given contact the company that the bone stimulator was entered from and try to go ahead and get this as well and I encouraged her to do so.  -Continue with daily dressing changes to the left hallux wound. She has not been changing the bandage or plan a bandage.  -RTC 2 weeks or sooner if needed. Call any questions or concerns meantime.   Celesta Gentile, DPM

## 2017-01-11 MED ORDER — HYDROCODONE-ACETAMINOPHEN 5-325 MG PO TABS: 1.0000 | ORAL_TABLET | Freq: Four times a day (QID) | ORAL | 0 refills | Status: DC | PRN

## 2017-01-11 NOTE — Telephone Encounter (Signed)
If she can stay off of her foot as much as possible that would be fine.  OK to do vicodin 5/325 1 tab po q6h prn pain but she is NOT to take this while working or driving.

## 2017-01-15 DIAGNOSIS — R52 Pain, unspecified: Secondary | ICD-10-CM

## 2017-01-19 ENCOUNTER — Ambulatory Visit (INDEPENDENT_AMBULATORY_CARE_PROVIDER_SITE_OTHER): Payer: PRIVATE HEALTH INSURANCE

## 2017-01-19 ENCOUNTER — Encounter: Payer: Self-pay | Admitting: Podiatry

## 2017-01-19 ENCOUNTER — Ambulatory Visit (INDEPENDENT_AMBULATORY_CARE_PROVIDER_SITE_OTHER): Payer: PRIVATE HEALTH INSURANCE | Admitting: Podiatry

## 2017-01-19 DIAGNOSIS — S92352D Displaced fracture of fifth metatarsal bone, left foot, subsequent encounter for fracture with routine healing: Secondary | ICD-10-CM

## 2017-01-19 DIAGNOSIS — S92301D Fracture of unspecified metatarsal bone(s), right foot, subsequent encounter for fracture with routine healing: Secondary | ICD-10-CM

## 2017-01-19 DIAGNOSIS — L97501 Non-pressure chronic ulcer of other part of unspecified foot limited to breakdown of skin: Secondary | ICD-10-CM

## 2017-01-24 NOTE — Progress Notes (Signed)
Subjective: 46 year old female presents the office today for follow up evaluation of left fifth metatarsal base fracture. She states that since last appointment she has not been sooner she started to use it. He is also been using a knee scooter the majority of the time. She states her blood sugar has been much better controlled and she has been actively try to work on this. She states it is down to 6.2 and she has received medical clearance should be continue to proceed with surgical intervention. She states that her pain has improved as well but she still gets some discomfort. She has continuing the CAM boot at all times. She states that she continues to clean the wound on the big toe daily for antibiotic ointment on the area. Denies any systemic complaints such as fevers, chills, nausea, vomiting. No acute changes since last appointment, and no other complaints at this time.   Objective: AAO x3, NAD DP/PT pulses palpable bilaterally, CRT less than 3 seconds This continuation tenderness however much improved to the left fifth metatarsal base and there is localized edema however again this is improved. There is no no open sores identified to this area. Peroneal tendons appear to be intact. There is continued ulceration plantar aspect the left hallux which is superficial at any surrounding erythema, ascending cellulitis. No fluctuance or crepitus and no malodor.  There is no other open lesions or pre-ulcerative lesions at this time. There is no pain with calf compression, swelling, warmth, erythema.   Assessment: 46 year old female left fifth metatarsal base fracture which is healing, healing ulceration left hallux   Plan: -Treatment options discussed including all alternatives, risks, and complications -Etiology of symptoms were discussed -X-rays were obtained and reviewed the patient. The x-ray appears to be increased consolidation across the fracture site today. -At the x-rays were improved I  believe that she continue with conservative treatment for now. Continue with nonweightbearing possible in CAM boot at all times. Continue with bone stimulator as well. -Her glycemic control is much better and down to 6.2 and she's received medical clearance to pursue elective surgery should we need to. -Continue with daily dressing changes of the hallux. Debrided this area taking the hyperkeratotic periwound. -Monitor for any clinical signs or symptoms of infection and directed to call the office immediately should any occur or go to the ER. -RTC 2 weeks or sooner if needed. Call any questions or concerns meantime.   Celesta Gentile, DPM

## 2017-02-02 ENCOUNTER — Ambulatory Visit (INDEPENDENT_AMBULATORY_CARE_PROVIDER_SITE_OTHER): Payer: PRIVATE HEALTH INSURANCE

## 2017-02-02 ENCOUNTER — Ambulatory Visit (INDEPENDENT_AMBULATORY_CARE_PROVIDER_SITE_OTHER): Payer: PRIVATE HEALTH INSURANCE | Admitting: Podiatry

## 2017-02-02 ENCOUNTER — Encounter: Payer: Self-pay | Admitting: Podiatry

## 2017-02-02 DIAGNOSIS — S92352D Displaced fracture of fifth metatarsal bone, left foot, subsequent encounter for fracture with routine healing: Secondary | ICD-10-CM | POA: Diagnosis not present

## 2017-02-02 DIAGNOSIS — L97501 Non-pressure chronic ulcer of other part of unspecified foot limited to breakdown of skin: Secondary | ICD-10-CM | POA: Diagnosis not present

## 2017-02-02 NOTE — Progress Notes (Signed)
Subjective: 46 year old female presents the office today for follow up evaluation of left fifth metatarsal base fracture. She states that she is doing better. She states her pain is much improved. She gets some discomfort at the end of the day after being on her feet and some swelling but better. She continues with antibiotic dressing changes to the hallux daily. She notices some bloody to clear drainage, although minimal. Her blood sugar is much improved. She states the highest has been in the 150's. Denies any systemic complaints such as fevers, chills, nausea, vomiting. No acute changes since last appointment, and no other complaints at this time.   Objective: AAO x3, NAD DP/PT pulses palpable bilaterally, CRT less than 3 seconds There is no tenderness palpation along the left fifth metatarsal base and there is trace edema to the area this is much improved. There is no erythema or increase in warmth. Is no pain along the course of the peroneal tendon of the tendon appears to be intact. On the plantar aspect left hallux continues to be a superficial granular wound. There is no surrounding erythema, ascending synovitis. Is no fluctuance, crepitus, malodor. There is no clinical signs of infection. The wound measures 0.7 x 0.6 cm superficial. There is no other open lesions or pre-ulcerative lesions at this time. There is no pain with calf compression, swelling, warmth, erythema.   Assessment: 46 year old female left fifth metatarsal base fracture which is healing, healing ulceration left hallux   Plan: -Treatment options discussed including all alternatives, risks, and complications -Etiology of symptoms were discussed -X-rays were obtained and reviewed the patient. The x-ray appears to be increased consolidation across the fracture site today. There is still gaping present on the lateral view.  At this time a discussed the surgical intervention versus continued conservative care. She wishes to hold  off on surgical intervention possible. She is making good improvement with clinically as well as making some changes on x-ray. We will continue the cam boot and continue bone stimulator daily. -Sharply debrided the wound to the left hallux today without complications or bleeding. Continue antibiotic ointment dressing changes daily. -Monitor for any clinical signs or symptoms of infection and directed to call the office immediately should any occur or go to the ER. -RTC 2 weeks or sooner if needed. Call any questions or concerns meantime.   Celesta Gentile, DPM

## 2017-02-23 ENCOUNTER — Ambulatory Visit (INDEPENDENT_AMBULATORY_CARE_PROVIDER_SITE_OTHER): Payer: PRIVATE HEALTH INSURANCE

## 2017-02-23 ENCOUNTER — Ambulatory Visit (INDEPENDENT_AMBULATORY_CARE_PROVIDER_SITE_OTHER): Payer: PRIVATE HEALTH INSURANCE | Admitting: Podiatry

## 2017-02-23 DIAGNOSIS — L97501 Non-pressure chronic ulcer of other part of unspecified foot limited to breakdown of skin: Secondary | ICD-10-CM

## 2017-02-23 DIAGNOSIS — S92352D Displaced fracture of fifth metatarsal bone, left foot, subsequent encounter for fracture with routine healing: Secondary | ICD-10-CM

## 2017-02-23 NOTE — Patient Instructions (Signed)

## 2017-02-26 NOTE — Progress Notes (Signed)
Subjective: 46 year old female presents the office today for follow up evaluation of left fifth metatarsal base fracture. She states that she has been on her feet more since last appointment but she does wear the boot. She states that she still gets swelling pain more towards the day. Denies any recent injury or trauma. She does continue to apply into buttock ointment to the wound and a left big toe and she feels that the wound is healing. She denies any drainage or pus or any surrounding redness or red streaks. She states that she had thick, one of her diabetes medications due to side effects however she states that her sugars been running between 90-150 still. Denies any systemic complaints such as fevers, chills, nausea, vomiting. No acute changes since last appointment, and no other complaints at this time.   Objective: AAO x3, NAD DP/PT pulses palpable bilaterally, CRT less than 3 seconds There is no tenderness palpation along the left fifth metatarsal base and there is mild edema to the area this is much improved. There is no surrounding erythema or increase in warmth. There is no pain along the course of the peroneal tendon of the tendon appears to be intact.  On the plantar aspect left hallux continues to be a superficial granular wound with hyperkeratotic periwound. There is no surrounding erythema, ascending synovitis. There is no fluctuance, crepitus, malodor. There is no clinical signs of infection. The wound measures 0.6 x 0.5 cm superficial. There is no other open lesions or pre-ulcerative lesions at this time. There is no pain with calf compression, swelling, warmth, erythema.   Assessment: 46 year old female left fifth metatarsal base fracture which is healing, healing ulceration left hallux   Plan: -Treatment options discussed including all alternatives, risks, and complications -X-rays were obtained and reviewed the patient. Radiolucent line distal evident the fifth metatarsal base  consistent with a fracture. There is minimal healing compared to previous x-ray. -Given the continuation of the fracture as well as pain I recommended open reduction internal fixation of this point. She was going proceed with surgical intervention. Discussed the risks of surgery including nonhealing, also foot, infection as well as other risks. We discussed ordering arterial studies to ensure adequate healing however she declines this. She does have palpable pulses in pedal hair is present. She believes that the wound on her toe is present because she is on her feet given the location and pressures causing this is a for circulation. -The incision placement as well as the postoperative course was discussed with the patient. I discussed risks of the surgery which include, but not limited to, infection, bleeding, pain, swelling, need for further surgery, delayed or nonhealing, painful or ugly scar, numbness or sensation changes, over/under correction, recurrence, transfer lesions, further deformity, hardware failure, DVT/PE, loss of toe/foot. Patient understands these risks and wishes to proceed with surgery. The surgical consent was reviewed with the patient all 3 pages were signed. No promises or guarantees were given to the outcome of the procedure. All questions were answered to the best of my ability. Before the surgery the patient was encouraged to call the office if there is any further questions. The surgery will be performed at the [facility] on an outpatient basis. -NWB -Continue bone stimulator   Celesta Gentile, DPM

## 2017-02-28 ENCOUNTER — Encounter: Payer: Self-pay | Admitting: Podiatry

## 2017-02-28 DIAGNOSIS — S92309K Fracture of unspecified metatarsal bone(s), unspecified foot, subsequent encounter for fracture with nonunion: Secondary | ICD-10-CM | POA: Diagnosis not present

## 2017-03-05 ENCOUNTER — Encounter: Payer: Self-pay | Admitting: Podiatry

## 2017-03-05 ENCOUNTER — Ambulatory Visit (INDEPENDENT_AMBULATORY_CARE_PROVIDER_SITE_OTHER): Payer: PRIVATE HEALTH INSURANCE

## 2017-03-05 ENCOUNTER — Ambulatory Visit (INDEPENDENT_AMBULATORY_CARE_PROVIDER_SITE_OTHER): Payer: Self-pay | Admitting: Podiatry

## 2017-03-05 DIAGNOSIS — S92352D Displaced fracture of fifth metatarsal bone, left foot, subsequent encounter for fracture with routine healing: Secondary | ICD-10-CM | POA: Diagnosis not present

## 2017-03-06 NOTE — Progress Notes (Signed)
Subjective: [name] is a 46 y.o. is seen today in office s/p left foot 5th metatarsal ORIF preformed on 02/28/2017. They state their pain is controlled and she is only taking ibuprofen intermittently for pain. She states that she did get a bandage off on Saturday and shower and get the incision wet including the incision. She states that her skin is very sensitive to bandages and his wife did it. She denies any drainage or pus coming from the incision. Denies any systemic complaints such as fevers, chills, nausea, vomiting. No calf pain, chest pain, shortness of breath.   Objective: General: No acute distress, AAOx3  DP/PT pulses palpable 2/4, CRT < 3 sec to all digits.  Protective sensation intact. Motor function intact.  Left foot: Incision is well coapted without any evidence of dehiscence and sutures are intact. There is minimal surrounding erythema with surrounding ascending cellulitis, fluctuance, crepitus, malodor, drainage/purulence. There is mild edema around the surgical site. There is minimal pain along the surgical site.  Hyperkeratotic lesion plantar hallux with hyperkeratotic lesion. No drainage, pus, surrounding erythema, ascending cellulitis.  No other areas of tenderness to bilateral lower extremities.  No other open lesions or pre-ulcerative lesions.  No pain with calf compression, swelling, warmth, erythema.   Assessment and Plan:  Status post left food surgery, doing well with no complications   -Treatment options discussed including all alternatives, risks, and complications -X-rays were obtained and reviewed with the patient. Hardware intact to the fifth metatarsal. Fracture site present the fifth metatarsal base. -Antibiotic ointment and a Band-Aid is applied on the incision. I recommended her to hold off on giving this area wet again. Finish course of antibiotics. There is mild erythema with a slightly more from inflammation as opposed to infection. Continue to monitor  closely. 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 -Nonweightbearing. She states that she has been nonweightbearing she leaves the hallux but she does walk in the boot otherwise. -Ice/elevation -Pain medication as needed. -Monitor for any clinical signs or symptoms of infection and DVT/PE and directed to call the office immediately should any occur or go to the ER. -Follow-up in 1 week or sooner if any problems arise. In the meantime, encouraged to call the office with any questions, concerns, change in symptoms.   Celesta Gentile, DPM

## 2017-03-12 ENCOUNTER — Encounter: Payer: Self-pay | Admitting: Podiatry

## 2017-03-12 ENCOUNTER — Ambulatory Visit (INDEPENDENT_AMBULATORY_CARE_PROVIDER_SITE_OTHER): Payer: PRIVATE HEALTH INSURANCE | Admitting: Podiatry

## 2017-03-12 DIAGNOSIS — L97501 Non-pressure chronic ulcer of other part of unspecified foot limited to breakdown of skin: Secondary | ICD-10-CM

## 2017-03-12 DIAGNOSIS — S92352D Displaced fracture of fifth metatarsal bone, left foot, subsequent encounter for fracture with routine healing: Secondary | ICD-10-CM

## 2017-03-13 NOTE — Progress Notes (Signed)
Subjective: [name] is a 46 y.o. is seen today in office s/p left foot 5th metatarsal ORIF preformed on 02/28/2017. She states overall she is doing well. She presents today for suture removal. She does continue to walk in the cam boot but she has been nondistended work. She tries ice and elevate. She also states that she went to a concert last week. Otherwise she states that she is doing well and her pain is controlled. She also states the wound of the left big toe is healing well and she denies any drainage or pus. She has no other concerns today.  Denies any systemic complaints such as fevers, chills, nausea, vomiting. No calf pain, chest pain, shortness of breath.   Objective: General: No acute distress, AAOx3  DP/PT pulses palpable 2/4, CRT < 3 sec to all digits.  Protective sensation intact. Motor function intact.  Left foot: Incision is well coapted without any evidence of dehiscence and sutures are intact. There is no surrounding erythema and there is no surrounding ascending cellulitis, fluctuance, crepitus, malodor, drainage/purulence. There is mild edema around the surgical site but has improved. There is minimal pain along the surgical site.  Hyperkeratotic lesion plantar hallux with hyperkeratotic lesion. Upon debridement there is a small, superificial granular ulcer mesuring about 0.6 x 0.2 cm and is superficial. No drainage, pus, surrounding erythema, ascending cellulitis.  No other areas of tenderness to bilateral lower extremities.  No other open lesions or pre-ulcerative lesions.  No pain with calf compression, swelling, warmth, erythema.   Assessment and Plan:  Status post left foot surgery, doing well with no complications; left hallux ulcer without any signs of infection.   -Treatment options discussed including all alternatives, risks, and complications  -Sutures removed today without complications. Steri-Strips are applied over the incision followed by sterile for a dry  sterile dressing. She did perform daily dressing changes with a small and Neosporin. -Recommended nonweightbearing she has been weightbearing in a cam boot. -Continue ice and elevation. -Remain on desk duty work until I see her back. -I sharply debrided the wound to the left hallux with any complications and there is no underlying signs of infection. Continue to monitor. Continue daily dressing changes into buttock ointment. -Monitor for any clinical signs or symptoms of infection and DVT/PE and directed to call the office immediately should any occur or go to the ER. -Follow-up in 2 weeks or sooner if any problems arise. In the meantime, encouraged to call the office with any questions, concerns, change in symptoms.   *x-ray next appointment  Celesta Gentile, DPM

## 2017-03-16 ENCOUNTER — Ambulatory Visit: Payer: PRIVATE HEALTH INSURANCE | Admitting: Podiatry

## 2017-03-16 NOTE — Progress Notes (Signed)
DOS 06.13.2018 Left 5th (fifth) metatarsal open reduction internal fixation.

## 2017-03-30 ENCOUNTER — Ambulatory Visit (INDEPENDENT_AMBULATORY_CARE_PROVIDER_SITE_OTHER): Payer: PRIVATE HEALTH INSURANCE | Admitting: Podiatry

## 2017-03-30 ENCOUNTER — Ambulatory Visit (INDEPENDENT_AMBULATORY_CARE_PROVIDER_SITE_OTHER): Payer: PRIVATE HEALTH INSURANCE

## 2017-03-30 ENCOUNTER — Encounter: Payer: Self-pay | Admitting: Podiatry

## 2017-03-30 DIAGNOSIS — S92352D Displaced fracture of fifth metatarsal bone, left foot, subsequent encounter for fracture with routine healing: Secondary | ICD-10-CM | POA: Diagnosis not present

## 2017-03-30 DIAGNOSIS — L97501 Non-pressure chronic ulcer of other part of unspecified foot limited to breakdown of skin: Secondary | ICD-10-CM | POA: Diagnosis not present

## 2017-04-02 NOTE — Progress Notes (Signed)
Subjective: [name] is a 46 y.o. is seen today in office s/p left foot 5th metatarsal ORIF preformed on 02/28/2017. She states overall she is doing well. She is remain walking in the cam boot. She is been it just duty at work to limit activity. She states this is a mild swelling and was in the day mostly. Overall she states that she feels that she is healing well. As far as though wound to the left big toe she denies any drainage or pus. She has had no swelling or any redness. Her blood sugars have been much more controlled.  Denies any systemic complaints such as fevers, chills, nausea, vomiting. No calf pain, chest pain, shortness of breath.   Objective: General: No acute distress, AAOx3  DP/PT pulses palpable 2/4, CRT < 3 sec to all digits.  Protective sensation intact. Motor function intact.  Left foot: Incision is well coapted without any evidence of dehiscence and a scar is formed. There is mild edema to this area and faint erythema how this it. She more from the boot she states that she can feel rubbing in the boot which is causing this. There is no increase in warmth and there is no ascending cellulitis. There is no fluctuance, crepitus or any malodor and there is no drainage from the incision. Left plantar hallux: Hyperkeratotic lesion. Upon debridement there was a underlying ulceration measuring 0.4 x 0.2 cm and appears be improved. There is no drainage or pus expressed. There is no swelling erythema, ascending cellulitis. There is no fluctuance, crepitus, malodor. No other open lesions or pre-ulcerative lesions.  No pain with calf compression, swelling, warmth, erythema.   Assessment and Plan:  Status post left foot surgery, doing well with no complications; left hallux ulcer without any signs of infection.   -Treatment options discussed including all alternatives, risks, and complications -X-rays were obtained and reviewed with the patient. There does appear to be increased  consolidation across the fracture site. Hardware intact. No evidence of acute osteomyelitis. -Recommended continuing the CAM boot and continue desk duty and limited activity. -Ice and elevation. -Wound was sharply debrided to healthy granular tissue. Continue with antibiotic limit dressing changes daily. -Monitor for any clinical signs or symptoms of infection and directed to call the office immediately should any occur or go to the ER. -RTC 2 weeks or sooner if needed.  *x-ray next appointment  Celesta Gentile, DPM

## 2017-04-13 ENCOUNTER — Encounter: Payer: Self-pay | Admitting: Podiatry

## 2017-04-13 ENCOUNTER — Ambulatory Visit (INDEPENDENT_AMBULATORY_CARE_PROVIDER_SITE_OTHER): Payer: PRIVATE HEALTH INSURANCE

## 2017-04-13 ENCOUNTER — Ambulatory Visit (INDEPENDENT_AMBULATORY_CARE_PROVIDER_SITE_OTHER): Payer: Self-pay | Admitting: Podiatry

## 2017-04-13 DIAGNOSIS — S92352D Displaced fracture of fifth metatarsal bone, left foot, subsequent encounter for fracture with routine healing: Secondary | ICD-10-CM

## 2017-04-13 DIAGNOSIS — L97501 Non-pressure chronic ulcer of other part of unspecified foot limited to breakdown of skin: Secondary | ICD-10-CM

## 2017-04-13 NOTE — Progress Notes (Signed)
Subjective: [name] is a 46 y.o. is seen today in office s/p left foot 5th metatarsal ORIF preformed on 02/28/2017. She states overall she is doing well and she states that she's having no pain. She's been in a cam boot. 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 She's been tried a later foot as much as possible as well as ice. She states that she gets occasional soreness on the surgical site but overall has improved. She also states of the wound to the bottom of the big toe is doing well she has had no drainage or pus.  Denies any systemic complaints such as fevers, chills, nausea, vomiting. No calf pain, chest pain, shortness of breath.   Objective: General: No acute distress, AAOx3  DP/PT pulses palpable 2/4, CRT < 3 sec to all digits.  Protective sensation intact. Motor function intact.  Left foot: Incision is well coapted without any evidence of dehiscence and a scar is formed. Minimal hyperkeratotic tissue is present along the incision. Upon debridement there is no underlying ulceration, drainage or signs of infection and the incisions well-healed. There is no tenderness palpation of the incision. Is no edema, erythema, increased warmth. On the plantar aspect the left hallux is hyperkeratotic lesion with central granular wound. The wound is very superficial abrasion type lesion measured 0.3 x 0.2 cm. There is no surrounding erythema, ascending cellulitis. No fluctuance, crepitus, malodor. There is no clinical signs of infection present. No other open lesions or pre-ulcer lesions identified today. No pain with calf compression, swelling, warmth, erythema.   Assessment and Plan:  Status post left foot surgery, doing well with no complications; left hallux ulcer without any signs of infection.   -Treatment options discussed including all alternatives, risks, and complications -X-rays were obtained and reviewed with the patient. There does appear to be increased consolidation across the  fracture site. Hardware intact. No evidence of acute osteomyelitis. 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 -Wound was sharply debrided to healthy granular tissue. Continue with antibiotic ointment dressing changes daily. -Monitor for any clinical signs or symptoms of infection and directed to call the office immediately should any occur or go to the ER. -RTC 3 weeks or sooner if needed.  *x-ray next appointment; hopefully transition back to a regular shoe  Celesta Gentile, DPM

## 2017-04-16 ENCOUNTER — Telehealth: Payer: Self-pay | Admitting: Podiatry

## 2017-04-16 NOTE — Telephone Encounter (Signed)
Pt is currently at the dentist and since Dr Jacqualyn Posey had preformed surgery on her previously they were asking if pt needed to be premedicated to have a filing put in.   Per Dr Jacqualyn Posey pt is far enough out they does not need premedicated with antibiotics. I notified Nikki at the dental office.

## 2017-04-27 ENCOUNTER — Telehealth: Payer: Self-pay | Admitting: *Deleted

## 2017-04-27 NOTE — Telephone Encounter (Signed)
Pt asked when aspirin and cam walker could be discontinued. Dr. Jacqualyn Posey states as long as pt is walking in the boot she can discontinue the aspirin, but remain in the boot until seen at next appr 05/04/2017. Left message informing pt of Dr.Wagoner's orders.

## 2017-05-04 ENCOUNTER — Encounter: Payer: Self-pay | Admitting: Podiatry

## 2017-05-04 ENCOUNTER — Ambulatory Visit (INDEPENDENT_AMBULATORY_CARE_PROVIDER_SITE_OTHER): Payer: PRIVATE HEALTH INSURANCE

## 2017-05-04 ENCOUNTER — Ambulatory Visit (INDEPENDENT_AMBULATORY_CARE_PROVIDER_SITE_OTHER): Payer: PRIVATE HEALTH INSURANCE | Admitting: Podiatry

## 2017-05-04 DIAGNOSIS — S92352D Displaced fracture of fifth metatarsal bone, left foot, subsequent encounter for fracture with routine healing: Secondary | ICD-10-CM

## 2017-05-04 DIAGNOSIS — L97501 Non-pressure chronic ulcer of other part of unspecified foot limited to breakdown of skin: Secondary | ICD-10-CM

## 2017-05-06 NOTE — Progress Notes (Signed)
Subjective: [name] is a 46 y.o. is seen today in office s/p left foot 5th metatarsal ORIF preformed on 02/28/2017. She states that she is doing well she is remaining the cam boot at the majority the time. She does continue to work walking the cam boot. She has been on limited activity at work however. She denies any increase in swelling or redness to the surgical site. She states the winter big toes also doing well and she denies any drainage or pus coming from this area. She denies any redness or any red streaks. Denies any systemic complaints such as fevers, chills, nausea, vomiting. No calf pain, chest pain, shortness of breath.   Objective: General: No acute distress, AAOx3  DP/PT pulses palpable 2/4, CRT < 3 sec to all digits.  Protective sensation intact. Motor function intact.  Left foot: Incision is well coapted without any evidence of dehiscence and a scar is formed. There is no hyperkeratotic tissue along the incision. Incision is well-healed and there is trace edema to the area and there is no erythema or increase in warmth. There is minimal tenderness palpation along the surgical site. I'll plantar aspect the left hallux and the hyperkeratotic lesion with central ulceration. After debridement the wound measures approximately 0.5 x 0.1 cm in a superficial granular wound base and some a skin fissure type lesion. There is no probing to monitor monitor tunneling. There is no swelling erythema, ascending cellulitis. There is no fractures or crepitus. There is no malodor. No other open lesions or pre-ulcerative lesions are identified today. No pain with calf compression, swelling, warmth, erythema.   Assessment and Plan:  Status post left foot surgery, doing well with no complications; left hallux ulcer without any signs of infection.   -Treatment options discussed including all alternatives, risks, and complications -X-rays were obtained and reviewed with the patient.Hardware intact.  Radiolucent line is still evident across the fracture however there does appear to be some evidence of healing. No evidence of acute fracture identified. -At this time recommended continue with her bones to later. She's not been using his last several days and his been inconsistent with. Recommend is on a daily basis. Also continue the cam boot. As she is doing well at work we are going to increase her activity that way she can work for [employer] and not just 1.  -Ice and elevation. -Wound was sharply debrided to healthy granular tissue. Continue with antibiotic ointment dressing changes daily.Continue cam boot. -Monitor for any clinical signs or symptoms of infection and directed to call the office immediately should any occur or go to the ER. -RTC 3 weeks or sooner if needed.  *x-ray next appointment  Celesta Gentile, DPM

## 2017-05-15 ENCOUNTER — Other Ambulatory Visit (HOSPITAL_COMMUNITY)
Admission: RE | Admit: 2017-05-15 | Discharge: 2017-05-15 | Disposition: A | Discharge status: Home / Self Care | Payer: PRIVATE HEALTH INSURANCE | Source: Ambulatory Visit | Attending: Nurse Practitioner | Admitting: Nurse Practitioner

## 2017-05-15 ENCOUNTER — Other Ambulatory Visit: Payer: Self-pay | Admitting: Nurse Practitioner

## 2017-05-15 DIAGNOSIS — Z124 Encounter for screening for malignant neoplasm of cervix: Secondary | ICD-10-CM | POA: Insufficient documentation

## 2017-05-16 LAB — CYTOLOGY - PAP
Diagnosis: NEGATIVE
HPV (WINDOPATH): NOT DETECTED

## 2017-05-25 ENCOUNTER — Encounter: Payer: Self-pay | Admitting: Podiatry

## 2017-05-25 ENCOUNTER — Ambulatory Visit (INDEPENDENT_AMBULATORY_CARE_PROVIDER_SITE_OTHER): Payer: PRIVATE HEALTH INSURANCE

## 2017-05-25 ENCOUNTER — Ambulatory Visit (INDEPENDENT_AMBULATORY_CARE_PROVIDER_SITE_OTHER): Payer: PRIVATE HEALTH INSURANCE | Admitting: Podiatry

## 2017-05-25 DIAGNOSIS — L97501 Non-pressure chronic ulcer of other part of unspecified foot limited to breakdown of skin: Secondary | ICD-10-CM

## 2017-05-25 DIAGNOSIS — S92352D Displaced fracture of fifth metatarsal bone, left foot, subsequent encounter for fracture with routine healing: Secondary | ICD-10-CM

## 2017-05-25 NOTE — Progress Notes (Signed)
Subjective: [name] is a 46 y.o. is seen today in office s/p left foot 5th metatarsal ORIF preformed on 02/28/2017. Regards the fracture she states that she is doing well. She continues to with a cam boot while working she states that they have been very busy the last 3 weeks a lot more walking than normal. On the right and weak and she goes without her boot. She's can tell the end of day she gets some swelling and discomfort but overall this is improving. In regards to the wound the left hallux she states that she is a callus buildup to the area. Denies any drainage or pus. She's been using Green tea boil on the area. Denies any drainage or pus incising redness or red streaks. She states that her toenail the big toe is loose and she did take this off herself. Denies any systemic complaints such as fevers, chills, nausea, vomiting. No calf pain, chest pain, shortness of breath.   Objective: General: No acute distress, AAOx3  DP/PT pulses palpable 2/4, CRT < 3 sec to all digits.  Protective sensation intact. Motor function intact.  Left foot: Incision is well coapted without any evidence of dehiscence and a scar is formed. There is no snapping and discomfort along the surgical site today. There is trace edema and there is no erythema or increase in warmth. No pain along the peroneal tendon. No other areas of tenderness identified. The plantar aspect of the left hallux continues to be a granular wound with a hyperkeratotic periwound. After debridement today the wound measures 0.5 x 0.5 cm in a superficial. There is no probing, undermining or tunneling. There is no swelling erythema, ascending cellulitis. His options or crepitus. There is no malodor. No clinical signs of infection present. No toenails present of the hallux on the left side in the nailbed appears to be healed without any signs of infection.  No other open lesions or pre-ulcerative lesions are identified today. No pain with calf  compression, swelling, warmth, erythema.   Assessment and Plan:  Status post left foot surgery, doing well with no complications; left hallux ulcer without any signs of infection.   -Treatment options discussed including all alternatives, risks, and complications -X-rays were obtained and reviewed with the patient.Hardware intact. Radiolucent line is still evident somewhat on the oblique view but overall there appears to be increased consolidation across the fracture site. No evidence of acute osteomyelitis present. No soft tissue emphysema is present. -Regards the fracture she can slowly start to transition to a shoe made is a very gradual slow transition. Continue bone stimulator. Ice and elevation. Discussed supportive shoe as she is transitioning. -Nail but is healed tibia in symptoms of infection. Recommend do not think her toenail himself -Wound sharply debrided today to the left hallux toenail any complications. Prisma was applied and reviewed this daily. Continue offloading pads. Monitor for signs or symptoms of infection. -Follow-up in one week for wound check or sooner if needed. Call any questions or concerns meantime. Monitor for any clinical signs or symptoms of infection and directed to call the office immediately should any occur or go to the ER.  Celesta Gentile, DPM

## 2017-06-01 ENCOUNTER — Ambulatory Visit (INDEPENDENT_AMBULATORY_CARE_PROVIDER_SITE_OTHER): Payer: PRIVATE HEALTH INSURANCE | Admitting: Podiatry

## 2017-06-01 ENCOUNTER — Encounter: Payer: Self-pay | Admitting: Podiatry

## 2017-06-01 DIAGNOSIS — L97501 Non-pressure chronic ulcer of other part of unspecified foot limited to breakdown of skin: Secondary | ICD-10-CM

## 2017-06-01 DIAGNOSIS — S92352D Displaced fracture of fifth metatarsal bone, left foot, subsequent encounter for fracture with routine healing: Secondary | ICD-10-CM

## 2017-06-01 NOTE — Progress Notes (Signed)
Subjective: 46 year old female presents the office in follow-up evaluation of wound to left big toe. She says the area is doing better she denies any drainage or pus. She states that she has tried a better about not picking at the area. She has continues were her surgical boot while at work but otherwise she is undergoing a regular shoe. She says the surgical site is doing well she's having minimal pain at the fifth metatarsal base but she does get some discomfort after being on her feet all day. She denies any swelling or redness or any drainage in the wound. Denies any systemic complaints such as fevers, chills, nausea, vomiting. No acute changes since last appointment, and no other complaints at this time.   Objective: AAO x3, NAD DP/PT pulses palpable bilaterally, CRT less than 3 seconds On the plantar aspect of the left hallux continues been ulceration today measuring 0.5 x 0.3 x 0.1 cm with a granular wound base and hyperkeratotic periwound. There is no drainage or pus. There is no surrounding erythema, ascending synovitis. No questions or crepitus. There is no malodor. Scar from the prior surgeries well-healed and there is no snuff can swelling, redness or pain to the area today. No open lesions or pre-ulcerative lesions.  No pain with calf compression, swelling, warmth, erythema  Assessment: Healing ulceration left plantar hallux  Plan: -All treatment options discussed with the patient including all alternatives, risks, complications.  -Wound sharply debrided through the any, occasions to healthy, granular tissue. Iodosorb was applied. Continue a small amount of antibiotic ointment dressing changes daily. Continue offloading. Monitor for signs or symptoms of infection. -Regards to surgical site she is doing better. Presently transition to regular shoe. -Patient encouraged to call the office with any questions, concerns, change in symptoms.  -Follow-up in 10 days or sooner if needed. This  for wound debridement. X-rays of the left foot.  Celesta Gentile, DPM

## 2017-06-08 ENCOUNTER — Ambulatory Visit (INDEPENDENT_AMBULATORY_CARE_PROVIDER_SITE_OTHER): Payer: PRIVATE HEALTH INSURANCE

## 2017-06-08 ENCOUNTER — Encounter: Payer: Self-pay | Admitting: Podiatry

## 2017-06-08 ENCOUNTER — Ambulatory Visit (INDEPENDENT_AMBULATORY_CARE_PROVIDER_SITE_OTHER): Payer: PRIVATE HEALTH INSURANCE | Admitting: Podiatry

## 2017-06-08 DIAGNOSIS — L97501 Non-pressure chronic ulcer of other part of unspecified foot limited to breakdown of skin: Secondary | ICD-10-CM

## 2017-06-08 DIAGNOSIS — S92352D Displaced fracture of fifth metatarsal bone, left foot, subsequent encounter for fracture with routine healing: Secondary | ICD-10-CM

## 2017-06-08 MED ORDER — SILVER SULFADIAZINE 1 % EX CREA: 1.0000 "application " | TOPICAL_CREAM | Freq: Every day | CUTANEOUS | 0 refills | Status: DC

## 2017-06-08 NOTE — Progress Notes (Signed)
Subjective: 46 year old female presents the office in follow-up evaluation of wound to left big toe. She has been using the Prisma dressing to her toe. She states that she's noticed some skin peeling around the toe but she denies any drainage or pus and she denies any increase in redness or swelling or any red streaks. She states that the surgical site of the ulcer aspect left foot is doing better. She gets some occasional soreness and swelling towards the end of the day. She states that she is ready to go full days without wearing the surgical boot. She denies any recent injury or trauma. She also states that she continues to use the bone stimulator although she does miss some days. She also does range of motion exercises at home to help with the strengthening and rehabilitation. She has no other concerns. Denies any systemic complaints such as fevers, chills, nausea, vomiting. No acute changes since last appointment, and no other complaints at this time.   Objective: AAO x3, NAD DP/PT pulses palpable bilaterally, CRT less than 3 seconds On the plantar aspect of the left hallux continues been ulceration today measuring 0.5 x 0.5 x 0.1 cm with a granular wound base and hyperkeratotic periwound. There is a small area of superficial skin breakdown or sedation to this area which is new. There is no drainage or pus. There is no surrounding erythema, ascending cellulitis. No fluctuation or crepitus. There is no malodor. Scar from the prior surgeries well-healed and there is no snuff can swelling, redness or pain to the area today. Subjective she soaking some discomfort towards the end of the day but overall she states that she is doing much better. No open lesions or pre-ulcerative lesions.  No pain with calf compression, swelling, warmth, erythema  Assessment: Healing ulceration left plantar hallux; healing surgical site fifth metatarsal base fracture  Plan: -All treatment options discussed with the  patient including all alternatives, risks, complications.  In regards the wound is sharply debrided this today with a any complications. This was debrided down to healthy, granular tissue. We will switch to Silvadene dressing changes daily which I prescribed to her today. Continue offloading pad. Monitor for signs or symptoms of infection to call the office if signs of symptoms of infection are to occur. I'll see back in 2 weeks for this. -Regards the Jones fracture she is doing well. X-rays were obtained and reviewed. The does still appear to be a radiolucent line however there does appear to be some increased consolidation across the fracture site. She's having no pain with this. She states that she wants try to wear regular shoe. Her CAM boot is worn out. We will start aware regular shoe full time. If she still needs the boot to call the office and we'll dispense a new short cam boot as hers is worn out and has a hole in the bottom of the heel. Continue bone stimulator daily.  -Follow up in 2 weeks or sooner if needed.  Celesta Gentile, DPM

## 2017-06-11 ENCOUNTER — Ambulatory Visit: Payer: PRIVATE HEALTH INSURANCE | Admitting: Podiatry

## 2017-06-15 ENCOUNTER — Telehealth: Payer: Self-pay

## 2017-06-15 ENCOUNTER — Telehealth: Payer: Self-pay | Admitting: Podiatry

## 2017-06-15 NOTE — Telephone Encounter (Signed)
Dr. 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 I would like to report to him that on Wednesday night around 3 am I was awoke by a bursting and popping sensation in the top of my left foot with intense pain. The pain continued through the day yesterday. Then, last night around 3 am I had a searing burning pain that brought me to tears. I could not stand on my foot or leg at the time. This was about 3 am Thursday night/Friday morning. I'm still having pain in my left foot and leg from the knee down. I would like his directions and what to do in reference to this. Does he think I could have blood clots or could it be something else. This is the foot he did surgery on. Please call me back at [phone].

## 2017-06-15 NOTE — Telephone Encounter (Signed)
Spoke with patient regarding symptoms of pain in her surgical foot. She states she her a popping noise in her foot that woke her up in the middle of the night. She has had pain and swelling since then on the dorsal and lateral aspect of her foot. Pain has subsided some today. She called with concern of DVT  Denies pain, warmth or redness in calf. Denies [facility] or anxious type feelings. She did state that she was just started on birth control as a hormonal supplement.   I advised her to contact her PCP, start using her boot, elevate and reduce activities. Advised her to go to the ER with any acute symptom signs of DVT. Keep her scheduled appt with Dr Jacqualyn Posey on 06/22/17. She is to follow up sooner if symptoms worsen

## 2017-06-17 ENCOUNTER — Ambulatory Visit (HOSPITAL_COMMUNITY)
Admission: RE | Admit: 2017-06-17 | Discharge: 2017-06-17 | Disposition: A | Discharge status: Home / Self Care | Payer: PRIVATE HEALTH INSURANCE | Source: Ambulatory Visit | Attending: Geriatric Medicine | Admitting: Geriatric Medicine

## 2017-06-17 ENCOUNTER — Other Ambulatory Visit (HOSPITAL_COMMUNITY): Payer: Self-pay | Admitting: Geriatric Medicine

## 2017-06-17 DIAGNOSIS — R590 Localized enlarged lymph nodes: Secondary | ICD-10-CM | POA: Insufficient documentation

## 2017-06-17 DIAGNOSIS — S91109A Unspecified open wound of unspecified toe(s) without damage to nail, initial encounter: Secondary | ICD-10-CM | POA: Diagnosis not present

## 2017-06-17 DIAGNOSIS — R7989 Other specified abnormal findings of blood chemistry: Secondary | ICD-10-CM | POA: Diagnosis not present

## 2017-06-17 DIAGNOSIS — E119 Type 2 diabetes mellitus without complications: Secondary | ICD-10-CM | POA: Insufficient documentation

## 2017-06-17 NOTE — Progress Notes (Signed)
VASCULAR LAB PRELIMINARY  PRELIMINARY  PRELIMINARY  PRELIMINARY  Left lower extremity venous duplex completed.    Preliminary report:  There is no DVT or SVT noted in the left lower extremity. There is an enlarged inguinal lymph node noted. There is tissue disturbance noted in the top of patient's foot at site of "pop."  Marlene Pfluger, RVT 06/17/2017, 2:31 PM

## 2017-06-22 ENCOUNTER — Ambulatory Visit (INDEPENDENT_AMBULATORY_CARE_PROVIDER_SITE_OTHER): Payer: PRIVATE HEALTH INSURANCE

## 2017-06-22 ENCOUNTER — Encounter: Payer: Self-pay | Admitting: Podiatry

## 2017-06-22 ENCOUNTER — Ambulatory Visit (INDEPENDENT_AMBULATORY_CARE_PROVIDER_SITE_OTHER): Payer: PRIVATE HEALTH INSURANCE | Admitting: Podiatry

## 2017-06-22 VITALS — BP 112/83 | HR 97 | Temp 98.1°F | Resp 18

## 2017-06-22 DIAGNOSIS — L97501 Non-pressure chronic ulcer of other part of unspecified foot limited to breakdown of skin: Secondary | ICD-10-CM

## 2017-06-22 DIAGNOSIS — M779 Enthesopathy, unspecified: Secondary | ICD-10-CM

## 2017-06-22 DIAGNOSIS — S92352D Displaced fracture of fifth metatarsal bone, left foot, subsequent encounter for fracture with routine healing: Secondary | ICD-10-CM

## 2017-06-22 DIAGNOSIS — E11621 Type 2 diabetes mellitus with foot ulcer: Secondary | ICD-10-CM | POA: Insufficient documentation

## 2017-06-22 DIAGNOSIS — S92353D Displaced fracture of fifth metatarsal bone, unspecified foot, subsequent encounter for fracture with routine healing: Secondary | ICD-10-CM | POA: Insufficient documentation

## 2017-06-22 DIAGNOSIS — R609 Edema, unspecified: Secondary | ICD-10-CM

## 2017-06-22 DIAGNOSIS — L97509 Non-pressure chronic ulcer of other part of unspecified foot with unspecified severity: Secondary | ICD-10-CM

## 2017-06-22 NOTE — Progress Notes (Signed)
Subjective: 46 year old female presents the office in follow-up evaluation of wound to left big toe as well as for follow-up of fracture to the fifth metatarsal base on the left foot. Since her last appointment with me last Wednesday she woke up in the middle night with a popping in her foot as well as swelling. The next day she started getting calf pain. Friday night she was ordered a d-dimer which is mildly positive on Sunday she had a venous duplex which was negative for DVT. She states that over the last week the swelling to her foot or ankle has improved and she has been wearing a regular shoe. She is able to move her ankle with mild discomfort however again she states the pain is much improved. She states a similar issue happened about 5 years ago and her wrist on her right side and they're unable to explain what happened. Denies any systemic complaints such as fevers, chills, nausea, vomiting. No acute changes since last appointment, and no other complaints at this time.   Objective: AAO x3, NAD DP/PT pulses palpable bilaterally, CRT less than 3 seconds On the plantar aspect of the left hallux continues been ulceration today measuring 0.4 x 0.4 x 0.1 cm with a granular wound base and hyperkeratotic periwound. There is no probing to bone, undermining or tunneling. There is no surrounding erythema, ascending cellulitis. There is no fluctuance or crepitus. There is no malodor. Regards the left foot there is mild swelling to the anterior aspect of the ankle and dorsal foot but there is no erythema or increase in warmth. She is able to dorsiflex, plantarflex, invert, evert without any problems and MMT is 5/5. Has mild discomfort with dorsiflexion. There is no defect noted within the extensor tendons and the tendons appear to be intact. Achilles tendon intact. Plantar fascia intact. There is no specific area pinpoint tenderness. No open lesions or pre-ulcerative lesions.  No pain with calf compression,  swelling, warmth, erythema  Assessment: Healing ulceration left plantar hallux; healing surgical site fifth metatarsal base fracture; left foot swelling  Plan: -All treatment options discussed with the patient including all alternatives, risks, complications.  -The wound to left hallux is sharply debrided without any complications to healthy, granular tissue. Silvadene was applied and recommended her to do this daily. She is only been using Silvadene for the last week as there is a problem getting up from her pharmacy. Monitor for any clinical signs or symptoms of infection and directed to call the office immediately should any occur or go to the ER. -X-rays were obtained and reviewed today. There is no evidence of acute fracture identified. Hardware intact the fifth metatarsal. Radiolucent line is evident on the oblique view but there is consolidation of the AP and lateral view. -Discussed that if symptoms continue we will get an MRI. For now we will placement Tri-Lock ankle brace was dispensed today. Elevation. -Follow-up in 2 weeks or sooner if any issues are to arise.  Celesta Gentile, DPM  Celesta Gentile, DPM

## 2017-07-06 ENCOUNTER — Ambulatory Visit (INDEPENDENT_AMBULATORY_CARE_PROVIDER_SITE_OTHER): Payer: PRIVATE HEALTH INSURANCE | Admitting: Podiatry

## 2017-07-06 ENCOUNTER — Encounter: Payer: Self-pay | Admitting: Podiatry

## 2017-07-06 DIAGNOSIS — L97501 Non-pressure chronic ulcer of other part of unspecified foot limited to breakdown of skin: Secondary | ICD-10-CM

## 2017-07-06 DIAGNOSIS — R609 Edema, unspecified: Secondary | ICD-10-CM | POA: Diagnosis not present

## 2017-07-06 DIAGNOSIS — S92352D Displaced fracture of fifth metatarsal bone, left foot, subsequent encounter for fracture with routine healing: Secondary | ICD-10-CM

## 2017-07-09 NOTE — Progress Notes (Signed)
Subjective: 46 year old female presents the office in follow-up evaluation of wound to left big toe as well as for follow-up of fracture to the fifth metatarsal base on the left foot and for swelling to her ankle/foot. She discontinued wearing a regular shoe. She states the wound is big toe is doing better. She denies any drainage or pus and she feels the Silvadene is really helping. Denies any redness or swelling on the toe. The swelling she is having to the foot and ankle last appointment has much improved and she's having no pain associated with this. She still gets some occasional discomfort on the surgical site towards the end of the day but overall this is doing much better. She has no other concerns today. She denies any redness or warmth to her feet. Denies any systemic complaints such as fevers, chills, nausea, vomiting. No acute changes since last appointment, and no other complaints at this time.   Objective: AAO x3, NAD DP/PT pulses palpable bilaterally, CRT less than 3 seconds On the plantar aspect of the left hallux continues been ulceration today measuring 0.3 x 0.3 x  0.1 cm with a granular wound base and hyperkeratotic periwound. Overall the wound is very to be somewhat smaller. There is no drainage or pus there is no probing, undermining or tunneling. There is no fluctuance or crepitus. There is no malodor. Swelling to the left anterior ankle and foot is minimal and much improved and there is no associated erythema. There is no pain in the area. Scar from the surgery is well-healed and there is no discomfort to palpation today. There is no overlying erythema or increase in warmth. There is no swelling significantly to this area. No open lesions or pre-ulcerative lesions.  No pain with calf compression, swelling, warmth, erythema  Assessment: Healing ulceration left plantar hallux; healing surgical site fifth metatarsal base fracture; left foot swelling  Plan: -All treatment options  discussed with the patient including all alternatives, risks, complications.  -The wound to left hallux is sharply debrided without any complications to healthy, granular tissue. Silvadene was applied and recommended her to do this daily. Continue Silvadene dressing changes daily. Continue offloading all times. -Swelling to the foot is improved. Continue compression anklet, ice, elevation. As this is improving to hold off on MRIs is no pain -Surgical site is doing well. Continue ice and elevation. Continue posterior. She has not been using this on a regular basis.   [name], DPM

## 2017-07-20 ENCOUNTER — Encounter: Payer: Self-pay | Admitting: Podiatry

## 2017-07-20 ENCOUNTER — Ambulatory Visit (INDEPENDENT_AMBULATORY_CARE_PROVIDER_SITE_OTHER): Payer: PRIVATE HEALTH INSURANCE | Admitting: Podiatry

## 2017-07-20 DIAGNOSIS — L97501 Non-pressure chronic ulcer of other part of unspecified foot limited to breakdown of skin: Secondary | ICD-10-CM | POA: Diagnosis not present

## 2017-07-20 NOTE — Progress Notes (Signed)
Subjective: 46 year old female presents the office in follow-up evaluation of wound to left big toe as well as for follow-up of fracture to the fifth metatarsal base. The swelling is much improved. She's having no pain to her foot or ankle. She denies any drainage or possibly from the toe. She gets some occasional intermittent discomfort in the day on surgical site but she states that she is back to doing her full duty job and wearing a regular shoe without any issues. She has no other concerns today. Denies any systemic complaints such as fevers, chills, nausea, vomiting. No acute changes since last appointment, and no other complaints at this time.   Objective: AAO x3, NAD DP/PT pulses palpable bilaterally, CRT less than 3 seconds On the plantar aspect of the left hallux continues been ulceration today measuring 0.3 x 0.2 x  0.1 cm with a granular wound base and hyperkeratotic periwound. Wound base is granular. There is no probing, undermining or tunneling. There is no surrounding erythema, ascending cellulitis. There is no functions or crepitus. There is no malodor. There is no significant swelling to the anterior aspect of ankle or foot. There is no area pinpoint bony tenderness or pain the vibratory sensation. There is no tenderness on the fifth metatarsal base at this time the incision is well-healed and there is no swelling or redness to this area. No open lesions or pre-ulcerative lesions.  No pain with calf compression, swelling, warmth, erythema  Assessment: Healing ulceration left plantar hallux; healing surgical site fifth metatarsal base fracture; left foot swelling which is improved  Plan: -All treatment options discussed with the patient including all alternatives, risks, complications.  -The wound to left hallux is sharply debrided without any complications to healthy, granular tissue. Silvadene was applied and recommended her to do this daily. She is wearing a regular shoe but  continue offloading all times. -Can continue compression anklet to help with swelling but this is much improved and she is having no pain to the ankle or along the surgical site. Continue to monitor. -RTC 2 weeks for wound care or sooner if any issues are to arise.   Celesta Gentile, DPM

## 2017-08-03 ENCOUNTER — Ambulatory Visit (INDEPENDENT_AMBULATORY_CARE_PROVIDER_SITE_OTHER): Payer: PRIVATE HEALTH INSURANCE | Admitting: Podiatry

## 2017-08-03 ENCOUNTER — Encounter: Payer: Self-pay | Admitting: Podiatry

## 2017-08-03 DIAGNOSIS — L97501 Non-pressure chronic ulcer of other part of unspecified foot limited to breakdown of skin: Secondary | ICD-10-CM | POA: Diagnosis not present

## 2017-08-06 NOTE — Progress Notes (Signed)
Subjective: 46 year old female presents the office in follow-up evaluation of wound to left big toe.  She states that she has been still using the Silvadene on the wound daily and she has not noticed any drainage or pus or any pain to the toe.  She feels that she is doing well.  She gets some occasional pain still at the end of the day although mild on the surgical site but she has not had any swelling on the surgical site.  She has no other concerns today. Denies any systemic complaints such as fevers, chills, nausea, vomiting. No acute changes since last appointment, and no other complaints at this time.   She states that her A1c has increased and is over 8 now.   Objective: AAO x3, NAD DP/PT pulses palpable bilaterally, CRT less than 3 seconds On the plantar aspect of the left hallux continues been ulceration today measuring 0.3 x 0.3 x  0.1 cm with a granular wound base and hyperkeratotic periwound.  Prior to debridement the wound appears to be healed as there was callus overlying the area but upon debridement the wound was still present.  There is no significant surrounding erythema, ascending cellulitis.  There is no fluctuance or crepitus.  There is no malodor.  There is no probing, undermining or tunneling. The incision from the surgery is well-healed today there is no tenderness to palpation of the surgical site there is no swelling to the area. No pain with calf compression, swelling, warmth, erythema  Assessment: Healing ulceration left plantar hallux; healing surgical site fifth metatarsal base fracture; left foot swelling which is improved  Plan: -All treatment options discussed with the patient including all alternatives, risks, complications.  -The wound to left hallux is sharply debrided without any complications to healthy, granular tissue. Silvadene was applied and recommended her to do this daily. She is wearing a regular shoe but continue offloading all times. -No pain on  surgical site she is doing well from a surgical standpoint. -RTC 2 weeks for wound care or sooner if any issues are to arise.   *X-rays next appointment  Celesta Gentile, DPM

## 2017-08-17 ENCOUNTER — Ambulatory Visit (INDEPENDENT_AMBULATORY_CARE_PROVIDER_SITE_OTHER): Payer: PRIVATE HEALTH INSURANCE | Admitting: Podiatry

## 2017-08-17 ENCOUNTER — Ambulatory Visit (INDEPENDENT_AMBULATORY_CARE_PROVIDER_SITE_OTHER): Payer: PRIVATE HEALTH INSURANCE

## 2017-08-17 ENCOUNTER — Encounter: Payer: Self-pay | Admitting: Podiatry

## 2017-08-17 DIAGNOSIS — L97501 Non-pressure chronic ulcer of other part of unspecified foot limited to breakdown of skin: Secondary | ICD-10-CM

## 2017-08-17 DIAGNOSIS — S92352D Displaced fracture of fifth metatarsal bone, left foot, subsequent encounter for fracture with routine healing: Secondary | ICD-10-CM | POA: Diagnosis not present

## 2017-08-21 NOTE — Progress Notes (Signed)
Subjective: 46 year old female presents the office in follow-up evaluation of wound to left big toe.  She states that overall she is doing better.  She has not noticed any drainage or pus.  She still been using the Silvadene to the wound daily.  She denies any pain to the big toe she denies any redness or drainage or any swelling.  She has some occasional discomfort the fifth metatarsal base at the end of the day after she is been on her feet all day at work but otherwise she has been doing well.  She has not had any increase in swelling to her foot.  She has no other concerns today. Denies any systemic complaints such as fevers, chills, nausea, vomiting. No acute changes since last appointment, and no other complaints at this time.    Objective: AAO x3, NAD DP/PT pulses palpable bilaterally, CRT less than 3 seconds On the plantar aspect of the left hallux continues been ulceration today measuring 0.3 x 0.3 x  0.1 cm with a granular wound base and hyperkeratotic periwound.  There is more hyperkeratotic tissue today which overlying the wound but after debridement the wound measures about the same size.  The wound to look better prior to debridement but after cleaning the area was the same size.  There is no probing, undermining or tunneling.  There is no surrounding erythema, ascending sialitis.  There is no fluctuance or crepitus.  There is no malodor. Scar to the prior surgical site fifth metatarsal base which is well-healed there is no tenderness palpation today.  There is no significant edema, erythema or increased warmth.  There is no pain. No swelling to the foot otherwise. No pain with calf compression, swelling, warmth, erythema  Assessment: Left plantar hallux ulceration  Plan: -All treatment options discussed with the patient including all alternatives, risks, complications.  -X-rays were obtained and reviewed.  There is increased consolidation fifth metatarsal base.  No evidence of acute  osteomyelitis or soft tissue edema is present. -Overall the fifth metatarsal base area appears to be doing much better.  She has some occasional discomfort in the day but overall she is able to wear regular shoe, work without any significant pain or swelling.  She is discharged in the postoperative course of the fifth metatarsal base. -The wound to the hallux was sharply debrided today without any complications or bleeding to healthy, granular tissue.  Continue with Silvadene dressing.  There was more hyperkeratotic tissue today.  Continue offloading at all times. Monitor for any clinical signs or symptoms of infection and directed to call the office immediately should any occur or go to the ER. -RTC 2 weeks or sooner if needed.  Trula Slade DPM

## 2017-09-07 ENCOUNTER — Ambulatory Visit (INDEPENDENT_AMBULATORY_CARE_PROVIDER_SITE_OTHER): Payer: PRIVATE HEALTH INSURANCE | Admitting: Podiatry

## 2017-09-07 ENCOUNTER — Encounter: Payer: Self-pay | Admitting: Podiatry

## 2017-09-07 DIAGNOSIS — L97501 Non-pressure chronic ulcer of other part of unspecified foot limited to breakdown of skin: Secondary | ICD-10-CM

## 2017-09-07 DIAGNOSIS — L02612 Cutaneous abscess of left foot: Secondary | ICD-10-CM

## 2017-09-07 DIAGNOSIS — L03032 Cellulitis of left toe: Secondary | ICD-10-CM

## 2017-09-07 MED ORDER — SILVER SULFADIAZINE 1 % EX CREA: 1.0000 "application " | TOPICAL_CREAM | Freq: Every day | CUTANEOUS | 0 refills | Status: DC

## 2017-09-07 MED ORDER — CEPHALEXIN 500 MG PO CAPS: 500.0000 mg | ORAL_CAPSULE | Freq: Three times a day (TID) | ORAL | 0 refills | Status: DC

## 2017-09-07 NOTE — Progress Notes (Signed)
Subjective: 46 year old female presents the office in follow-up evaluation of wound to left big toe.  She states that she been out of Silvadene about a week ago and she has not been putting a bandage on the toe.  She states that she has a small amount of redness to the toe but no drainage or pus.  Denies any red streaks.  Denies any pain.  She has no other concerns today.  Denies any systemic complaints such as fevers, chills, nausea, vomiting. No acute changes since last appointment, and no other complaints at this time.    Objective: AAO x3, NAD DP/PT pulses palpable bilaterally, CRT less than 3 seconds On the plantar aspect of the left hallux continues been ulceration today measuring 0.6 x 0.5 x  0.3 cm with a granular wound base and hyperkeratotic periwound.  The wound is larger compared to last appointment.  There is mild surrounding erythema and there is no ascending cellulitis.  There is no drainage or pus expressed.  There is no fluctuation or crepitation.  There is no malodor.  There is no probing to bone, undermining or tunneling.  No other open lesions or pre-ulcerative lesions. No pain with calf compression, swelling, warmth, erythema  Assessment: Left plantar hallux ulceration, with mild erythema   Plan: -All treatment options discussed with the patient including all alternatives, risks, complications.  The wound was sharply debrided today after verbal consent was obtained to healthy, granular tissue.  Overnight it was applied.  Recommend to continue with Silvadene dressing changes daily and this was refilled for her.  Given the mild erythema I also-did prescribe Keflex.  Monitor any signs or symptoms of worsening infection to the ER should any occur.  Continue offloading at all times.  I will see her back in 2 weeks or sooner if any issues are to arise.  Call any questions or concerns in the meantime.  Trula Slade DPM

## 2017-09-21 ENCOUNTER — Encounter: Payer: Self-pay | Admitting: Podiatry

## 2017-09-21 ENCOUNTER — Ambulatory Visit (INDEPENDENT_AMBULATORY_CARE_PROVIDER_SITE_OTHER): Payer: PRIVATE HEALTH INSURANCE | Admitting: Podiatry

## 2017-09-21 DIAGNOSIS — L97501 Non-pressure chronic ulcer of other part of unspecified foot limited to breakdown of skin: Secondary | ICD-10-CM

## 2017-09-21 NOTE — Progress Notes (Signed)
Subjective: 47 year old female presents the office in follow-up evaluation of wound to left big toe.  She states that she cannot take antibiotics as prescribed this is upsetting her stomach and she did not complete the course.  She states the toe is not been red or swollen or any pus coming from the area so she stopped the antibiotics.  She denies any pain to the area.  She just notes that she has some bloody to clear drainage at the end of the day on the bandage after she has been on her feet all day.  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 Denies any systemic complaints such as fevers, chills, nausea, vomiting. No acute changes since last appointment, and no other complaints at this time.    Objective: AAO x3, NAD DP/PT pulses palpable bilaterally, CRT less than 3 seconds On the plantar aspect of the left hallux continues been ulceration today measuring 0.4 x 0.4 x  0.4 cm with a granular wound base and hyperkeratotic periwound.  There is no probing, undermining or tunneling.  There is no drainage or pus expressed there is no signs of an abscess.  There is faint surrounding erythema but there is no ascending sialitis.  There is no increase in warmth.  No fluctuance or crepitus.  There is no malodor.  There is no other open lesions or pre-ulcerative lesions identified today.    Assessment: Left plantar hallux ulceration, with improved erythema   Plan: -All treatment options discussed with the patient including all alternatives, risks, complications.  -After verbal consent was obtained I sharply debrided the wound to remove the hyperkeratotic periwound debrided the wound to healthy, granular tissue.  I want her to switch she is a small amount of antibiotic ointment dressing changes daily.  I did modify the inserts of her tennis shoe to help take pressure off the area and given the flexibility of her shoe I also added a graphite  insert to help stabilize her foot.  Also take more pressure off this area to allow it to heal better. Monitor for any clinical signs or symptoms of infection and directed to call the office immediately should any occur or go to the ER. -RTC 2 weeks or sooner if needed.  Trula Slade DPM

## 2017-10-05 ENCOUNTER — Encounter: Payer: Self-pay | Admitting: Podiatry

## 2017-10-05 ENCOUNTER — Ambulatory Visit (INDEPENDENT_AMBULATORY_CARE_PROVIDER_SITE_OTHER): Payer: PRIVATE HEALTH INSURANCE | Admitting: Podiatry

## 2017-10-05 DIAGNOSIS — L97522 Non-pressure chronic ulcer of other part of left foot with fat layer exposed: Secondary | ICD-10-CM | POA: Diagnosis not present

## 2017-10-05 DIAGNOSIS — L97501 Non-pressure chronic ulcer of other part of unspecified foot limited to breakdown of skin: Secondary | ICD-10-CM

## 2017-10-05 DIAGNOSIS — E1149 Type 2 diabetes mellitus with other diabetic neurological complication: Secondary | ICD-10-CM | POA: Diagnosis not present

## 2017-10-05 NOTE — Progress Notes (Signed)
Subjective: 47 year old female presents the office in follow-up evaluation of wound to left big toe.  She states that she has been using the Silvadene to the wound.  She states that she puts the bandage on in the morning and she leaves it on all day while working and sleeping and she changes to the a.m. in the morning.  She denies any drainage or pus and she denies any surrounding redness or red streaks or any swelling.  She has no other concerns today.  She does state that she does a lot of walking she is on her feet all day.  She denies any systemic complaints such as fevers, chills, nausea, vomiting.  No calf pain, chest pain, shortness of breath.  She has no other concerns today.  Objective: AAO x3, NAD DP/PT pulses palpable bilaterally, CRT less than 3 seconds On the plantar aspect of the left hallux there is a hyperkeratotic lesion with a central wound.  After debridement of the hyperkeratotic tissue the wound measures 1 x 0.6 x 0.3 cm with a granular wound base.  There is no probing, undermining or tunneling.  There is no surrounding erythema, ascending cellulitis.  There is no fluctuation or crepitation.  There is no malodor.  No other open lesions or pre-ulcerative lesions are identified today.  After debridement:   Before debridement:      Assessment: Left plantar hallux ulceration  Plan: -All treatment options discussed with the patient including all alternatives, risks, complications.  -After verbal consent was obtained the area was cleaned and the wound was sharply debrided with a #312 blade scalpel to remove all nonviable tissue along the wound and debrided to healthy, granular tissue.  The wound prior to debridement was approximately 0.2 x 0.3 cm and after debridement was a 1 x 0.6 x 0.3 cm and the wound was excisionally debrided. -Surgical shoe was dispensed today as well as a PegAssit to offload the ulcer. I do believe that offloading more is going to be beneficial for her.    -Continue with Silvadene to the wound daily and want her to change the bandage after work as well.  I think that keeping the bandage on all times keep it too moist as well. -Monitor for any clinical signs or symptoms of infection and directed to call the office immediately should any occur or go to the ER. -RTC 3 weeks (discussed with her that if there is any worsening to call the office before this.  If I am out of the office she can see another physician here and she understands this.)  Trula Slade DPM

## 2017-10-26 ENCOUNTER — Encounter: Payer: Self-pay | Admitting: Podiatry

## 2017-10-26 ENCOUNTER — Ambulatory Visit (INDEPENDENT_AMBULATORY_CARE_PROVIDER_SITE_OTHER): Payer: PRIVATE HEALTH INSURANCE | Admitting: Podiatry

## 2017-10-26 DIAGNOSIS — E1149 Type 2 diabetes mellitus with other diabetic neurological complication: Secondary | ICD-10-CM

## 2017-10-26 DIAGNOSIS — L97522 Non-pressure chronic ulcer of other part of left foot with fat layer exposed: Secondary | ICD-10-CM

## 2017-10-26 NOTE — Progress Notes (Signed)
Subjective: [name] presents the office today for follow-up evaluation of a wound to the bottom of the left big toe. She has only been cleaning the wound with alcohol and keeping a bandage on the toe all the time. She has not been using ointment much. She denies any drainage or pus and denies any swelling or redness.  Denies any systemic complaints such as fevers, chills, nausea, vomiting. No acute changes since last appointment, and no other complaints at this time.   Objective: AAO x3, NAD DP/PT pulses palpable bilaterally, CRT less than 3 seconds To the plantar aspect of the hallux continues to be ulceration with hyperkeratotic periwound.  After debridement today the wound measures 0.4 x 0.2 x 0.2 cm which is about the same.  There is no probing to bone, undermining or tunneling.  There is a granular wound base.  There is no drainage or pus.  No surrounding erythema, ascending cellulitis.  No fluctuation, crepitation.  No malodor.  There is no significant restriction of the first MTPJ range of motion or IPJ range of motion. No open lesions or pre-ulcerative lesions.  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  -After verbal consent was obtained to the area was cleaned with alcohol and the ulceration was sharply debrided with a #312 blade scalpel to healthy, granular, bleeding tissue.  There is no signs of abscess or infection.  We will switch to Betadine wet-to-dry dressing changes for now.  She keep the bandages on all day.  Discussed that she needs to change them during the day and at night when she gets home after work to remove the bandage.  I think that keeping the bandage on all the times also impeding this from healing.   -Today  we also discussed surgical intervention to help with the toe.  Discussed various options but she wishes off on any surgery for now.  We will continue to monitor. -Monitor for any clinical signs or symptoms of infection and directed to call the office immediately should any occur or go to the ER. -Follow-up in 2 weeks or sooner if needed.  Call any questions or concerns meantime.  Trula Slade DPM

## 2017-11-08 ENCOUNTER — Ambulatory Visit (INDEPENDENT_AMBULATORY_CARE_PROVIDER_SITE_OTHER): Payer: PRIVATE HEALTH INSURANCE | Admitting: Podiatry

## 2017-11-08 ENCOUNTER — Encounter: Payer: Self-pay | Admitting: Podiatry

## 2017-11-08 DIAGNOSIS — L97522 Non-pressure chronic ulcer of other part of left foot with fat layer exposed: Secondary | ICD-10-CM

## 2017-11-09 NOTE — Progress Notes (Signed)
Subjective: [name] presents the office today for follow-up evaluation of a wound to the bottom of the left big toe.  She states that she is continue with Betadine to the area.  She is remained in the surgical shoe but she continues to do a lot of walking and working.  She denies any drainage or pus coming from the area she denies any red streaks.  She has no swelling.  He has no new concerns today and overall she feels well and she denies any systemic complaints such as fevers, chills, nausea, vomiting. No acute changes since last appointment, and no other complaints at this time.   Objective: AAO x3, NAD DP/PT pulses palpable bilaterally, CRT less than 3 seconds To the plantar aspect of the hallux continues to be ulceration with hyperkeratotic periwound.  After debridement today the wound measures 0.4 x 0.2 x 0.2 cm which is about the same.  The wound overall has been unchanged.  There is no probing to bone, undermining or tunneling.  There is a granular wound base.  There is no drainage or pus.  No surrounding erythema, ascending cellulitis.  No fluctuation, crepitation.  No malodor.  There is no significant restriction of the first MTPJ range of motion or IPJ range of motion. No open lesions or pre-ulcerative lesions.  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 -After verbal consent was obtained the area was cleaned with alcohol and the wound was sharply debrided with a 312 blade scalpel to healthy, granular, bleeding tissue without any complications.  There is no evidence of abscess or signs of infection  We are going to change the dressing were going to switch to Prisma collagen dressing changes daily.  I did give her the remainder will be used today to do this.  Continue daily  dressing changes. -Monitor for any clinical signs or symptoms of infection and directed to call the office immediately should any occur or go to the ER. -Follow-up in 2 weeks or sooner if needed.  Call any questions or concerns.  Trula Slade DPM

## 2017-11-23 ENCOUNTER — Encounter: Payer: Self-pay | Admitting: Podiatry

## 2017-11-23 ENCOUNTER — Ambulatory Visit (INDEPENDENT_AMBULATORY_CARE_PROVIDER_SITE_OTHER): Payer: PRIVATE HEALTH INSURANCE | Admitting: Podiatry

## 2017-11-23 VITALS — Temp 97.6°F

## 2017-11-23 DIAGNOSIS — M79675 Pain in left toe(s): Secondary | ICD-10-CM

## 2017-11-23 DIAGNOSIS — L603 Nail dystrophy: Secondary | ICD-10-CM

## 2017-11-23 DIAGNOSIS — L97511 Non-pressure chronic ulcer of other part of right foot limited to breakdown of skin: Secondary | ICD-10-CM | POA: Diagnosis not present

## 2017-11-23 DIAGNOSIS — L97522 Non-pressure chronic ulcer of other part of left foot with fat layer exposed: Secondary | ICD-10-CM | POA: Diagnosis not present

## 2017-11-23 DIAGNOSIS — E1149 Type 2 diabetes mellitus with other diabetic neurological complication: Secondary | ICD-10-CM

## 2017-11-23 DIAGNOSIS — M79674 Pain in right toe(s): Secondary | ICD-10-CM

## 2017-11-23 NOTE — Progress Notes (Signed)
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  He states that over the weekend she started to notice a wound on the bottom of the right big toe as well which is new.  She does have a prescription for diabetic shoes today.  She states that she is cleaned the wound on the left side with alcohol and peroxide she is been putting Silvadene as well as Prisma over the wound.  The wound is getting somewhat better on the left side.  She denies any drainage or pus.  She says the right big toenail is thickened discolored showing to have this removed.  No drainage or swelling or redness or pain to the toenail site itself.  No other complaints today.Denies any systemic complaints such as fevers, chills, nausea, vomiting. No acute changes since last appointment, and no other complaints at this time.   Objective: AAO x3, NAD DP/PT pulses palpable bilaterally, CRT less than 3 seconds Protective sensation decreased with Simms Weinstein monofilament On the plantar aspects of the bilateral hallux toenails there is hyperkeratotic tissue the central ulceration of the left side worse than the right.  The right side is new today and the area is very small but does have surrounding hyperkeratotic tissue with a central granular wound.  There is no probing, undermining or tunneling.  Along the left plantar hallux the wound overall does appear to be somewhat smaller after debridement of the hyperkeratotic periwound.  The wound measures 1.0 0.3 x 0.2 cm and there is no probing, undermining or tunneling.  There is no drainage or pus coming from the areas and there is no ascending sialitis.  No fluctuation or crepitation.  The right hallux toenail is hypertrophic, dystrophic, discolored and elongated.  Subjectively there is irritation to the toenail site shoes.  No redness or drainage or any clinical signs of infection noted. No open lesions or pre-ulcerative lesions.  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  -In regard to the right hallux toenail would like to hold off on toenail removal today given the new wound on the bottom of the right hallux.  I did sharply debride the toenail today without any complications or bleeding. -I minimally debrided the right plantar hallux ulceration to remove the hyperkeratotic tissue.  I want her to use Prisma over the area daily. -In regards to the left hallux I did sharply debride the wound to healthy, granular tissue that any complications and she tolerated this well.  I debrided this a #312 blade scalpel to healthy, granular tissue.  Continue with daily dressing changes as well. Prisma was applied.  -She was molded today for diabetic shoes.  Paperwork was completed for precertification.  Given her history of ulceration, hammertoes, neuropathy I recommended diabetic shoes. -Monitor for any clinical signs or symptoms of infection and directed to call the office immediately should any occur or go to the ER. -RTC 2 weeks -Patient encouraged to call the office with any questions, concerns, change in symptoms.   Trula Slade DPM

## 2017-12-07 ENCOUNTER — Encounter: Payer: Self-pay | Admitting: Podiatry

## 2017-12-07 ENCOUNTER — Ambulatory Visit (INDEPENDENT_AMBULATORY_CARE_PROVIDER_SITE_OTHER): Payer: PRIVATE HEALTH INSURANCE | Admitting: Podiatry

## 2017-12-07 DIAGNOSIS — L97511 Non-pressure chronic ulcer of other part of right foot limited to breakdown of skin: Secondary | ICD-10-CM

## 2017-12-07 DIAGNOSIS — L97522 Non-pressure chronic ulcer of other part of left foot with fat layer exposed: Secondary | ICD-10-CM

## 2017-12-10 NOTE — Progress Notes (Signed)
Subjective: 47 year old female presents the office today for follow-up evaluation of ulcerations to both of her big toes.  She states the left foot is doing better but she is unsure if the right is doing any better.  She is been putting Prisma on the wounds daily.  She denies any drainage or pus and she denies any swelling or redness or any red streaks.  No recent injury.  She has no other concerns.  She states that she is on her feet quite a bit which is likely was causing the wound not healing.  She is awaiting diabetic shoes. Denies any systemic complaints such as fevers, chills, nausea, vomiting. No acute changes since last appointment, and no other complaints at this time.   Objective: AAO x3, NAD DP/PT pulses palpable bilaterally, CRT less than 3 seconds Sensation decreased with Simms Weinstein monofilament  To the plantar aspect of bilateral hallux continues to be a granular ulceration measuring approximately 0.4 x 0.4 cm in speech be symmetrical bilaterally.  There is no probing, undermining or tunneling.  There is no surrounding erythema, ascending cellulitis.  There is no fluctuation or crepitation.  There is no malodor.  No open lesions or pre-ulcerative lesion identified otherwise.   No pain with calf compression, swelling, warmth, erythema  Assessment: Ulcerations bilateral hallux  Plan: -All treatment options discussed with the patient including all alternatives, risks, complications.  -I did clean the wounds and sharply debride some of the minimal hyperkeratotic tissue along the area.  Continue with daily dressing changes as we discussed.  She needs to have offloading at all times.  She has been wearing a regular shoe at work.  I would prefer her to be in surgical shoes but is difficult for her to work on this.  We are still waiting diabetic shoes and hope this will take pressure off the area.  Discussed the wound care referral but she declines this. -Monitor for any clinical signs or  symptoms of infection and directed to call the office immediately should any occur or go to the ER. -RTC 2 weeks or sooner if needed.  -Patient encouraged to call the office with any questions, concerns, change in symptoms.   Trula Slade DPM

## 2017-12-14 ENCOUNTER — Ambulatory Visit: Payer: PRIVATE HEALTH INSURANCE | Admitting: Podiatry

## 2017-12-21 ENCOUNTER — Encounter: Payer: Self-pay | Admitting: Podiatry

## 2017-12-21 ENCOUNTER — Ambulatory Visit (INDEPENDENT_AMBULATORY_CARE_PROVIDER_SITE_OTHER): Payer: PRIVATE HEALTH INSURANCE | Admitting: Podiatry

## 2017-12-21 DIAGNOSIS — M2041 Other hammer toe(s) (acquired), right foot: Secondary | ICD-10-CM

## 2017-12-21 DIAGNOSIS — E1149 Type 2 diabetes mellitus with other diabetic neurological complication: Secondary | ICD-10-CM | POA: Diagnosis not present

## 2017-12-21 DIAGNOSIS — L97511 Non-pressure chronic ulcer of other part of right foot limited to breakdown of skin: Secondary | ICD-10-CM

## 2017-12-21 DIAGNOSIS — L97522 Non-pressure chronic ulcer of other part of left foot with fat layer exposed: Secondary | ICD-10-CM | POA: Diagnosis not present

## 2017-12-21 DIAGNOSIS — M2042 Other hammer toe(s) (acquired), left foot: Secondary | ICD-10-CM

## 2017-12-21 MED ORDER — CEPHALEXIN 500 MG PO CAPS: 500.0000 mg | ORAL_CAPSULE | Freq: Three times a day (TID) | ORAL | 0 refills | Status: DC

## 2017-12-21 NOTE — Progress Notes (Signed)
Subjective: 47 year old female presents the office today for follow-up evaluation of ulcerations to both of her big toes.  She states that often is doing well however the right side has had some redness that she is first started noticing on Wednesday and has been getting somewhat worse.  She states this may have been caused from the Band-Aid that she was putting on the toe.  She denies any drainage or pus and she denies any red streaks.  She also presented with a diabetic shoes.  Denies any systemic complaints such as fevers, chills, nausea, vomiting. No acute changes since last appointment, and no other complaints at this time.   Objective: AAO x3, NAD DP/PT pulses palpable bilaterally, CRT less than 3 seconds Sensation decreased with Simms Weinstein monofilament  On the plantar aspect the left hallux is a hyperkeratotic lesion with central ulceration which is smaller today measuring 0.2 x 0.2 x 0.2 cm.  There is no probing to bone, undermining or tunneling.  No surrounding erythema, ascending cellulitis.  There is no fluctuation or crepitation.  There is no malodor. On the plantar aspect the left hallux is an ulceration with surrounding hyperkeratotic lesion which appears also to have a old blister, epidermal lysis around the area.  Upon debridement the wound is larger measuring approximately 1 x 0.5 cm and there is no probing to bone, undermining or tunneling.  There is edema and erythema to the hallux but there is no fluctuation or crepitation and there is no ascending cellulitis or malodor. Hammertoes present No other open lesions or pre-ulcerative lesions are identified today.  No pain with calf compression, swelling, warmth, erythema  Assessment: Ulcerations bilateral hallux; with new erythema right hallux  Plan: -All treatment options discussed with the patient including all alternatives, risks, complications.  -Wounds were sharply debrided without any complications bilaterally with a #312  blade. The wounds were both sharply debrided to healthy, granular tissue.  -Given the erythema of the right hallux will start Keflex. -Diabetic shoes were dispensed today.  Hopefully this will help offload the wounds.  I want her to gradually get used to wearing the shoes discussed the break-in.  And also to monitor the feet daily.  There is any changes in the wounds or any other concerns to stop wearing the mental me know.  She does a lot of walking at work and this is contributing to her wounds and hopefully that she is to help offload this.  She states that when walking in the shoes today she feels more balance worse before she was off balance.  Trula Slade DPM

## 2018-01-03 ENCOUNTER — Encounter: Payer: Self-pay | Admitting: Podiatry

## 2018-01-03 ENCOUNTER — Ambulatory Visit (INDEPENDENT_AMBULATORY_CARE_PROVIDER_SITE_OTHER): Payer: PRIVATE HEALTH INSURANCE | Admitting: Podiatry

## 2018-01-03 DIAGNOSIS — L97522 Non-pressure chronic ulcer of other part of left foot with fat layer exposed: Secondary | ICD-10-CM

## 2018-01-03 DIAGNOSIS — L97511 Non-pressure chronic ulcer of other part of right foot limited to breakdown of skin: Secondary | ICD-10-CM | POA: Diagnosis not present

## 2018-01-07 NOTE — Progress Notes (Signed)
Subjective: [name] presents the office today for follow-up evaluation of a wound to the bottoms of both of her big toes.  She states that she is continue with daily dressing changes.  She has been putting the Medihoney on the left side as well as antibiotic ointment on the right side.  She could not tolerate the antibiotics she only took them for the first couple of days. She states the one on the left side is doing better but the right side is about the same.  She denies any drainage or pus from the areas.  She states that the left toenail was thick and so she did take the nail off herself and now she has a small wound to the nail bed.  She has no other concerns. Denies any systemic complaints such as fevers, chills, nausea, vomiting. No acute changes since last appointment, and no other complaints at this time.   Objective: AAO x3, NAD DP/PT pulses palpable bilaterally, CRT less than 3 seconds On the left hallux nail bed there is a superficial wound from where the nail was taken off but there is no probing, undermining or tunneling.  There is no surrounding erythema, ascending sialitis.  There is no fluctuation or crepitation.  There is no malodor.  On the plantar aspect of bilateral hallux at the level of the IPJ ulceration with hyperkeratotic periwound.  The left side appears to be smaller measuring 2 x 0.2 cm and there is no probing, undermining or tunneling.  The right side is about the same size with a granular wound base.  There is decreased erythema as well as decreased edema to the area.  There is no fluctuation or crepitation.  No malodor.  No ascending cellulitis.  No other open lesions identified. No open lesions or pre-ulcerative lesions.  No pain with calf compression, swelling, warmth, erythema  Assessment: Ulcerations bilateral hallux with decreased erythema and edema  Plan: -All treatment options discussed with the patient including all alternatives, risks, complications.  -The  wounds are sharply debrided today without any complications or bleeding.  Continue with dressing changes daily and I have given her Actisorb to apply to the wounds daily.  On the dorsal left hallux on her use a small amount of antibiotic ointment and a bandage daily. -Continue offloading at all times -Monitor for any clinical signs or symptoms of infection and directed to call the office immediately should any occur or go to the ER. -RTC  2 weeks or sooner if needed.  -Patient encouraged to call the office with any questions, concerns, change in symptoms.   Trula Slade DPM

## 2018-01-18 ENCOUNTER — Encounter: Payer: Self-pay | Admitting: Podiatry

## 2018-01-18 ENCOUNTER — Ambulatory Visit (INDEPENDENT_AMBULATORY_CARE_PROVIDER_SITE_OTHER): Payer: PRIVATE HEALTH INSURANCE | Admitting: Podiatry

## 2018-01-18 DIAGNOSIS — L97522 Non-pressure chronic ulcer of other part of left foot with fat layer exposed: Secondary | ICD-10-CM | POA: Diagnosis not present

## 2018-01-18 DIAGNOSIS — L97511 Non-pressure chronic ulcer of other part of right foot limited to breakdown of skin: Secondary | ICD-10-CM

## 2018-01-18 DIAGNOSIS — E1149 Type 2 diabetes mellitus with other diabetic neurological complication: Secondary | ICD-10-CM

## 2018-01-20 NOTE — Progress Notes (Signed)
Subjective: Injury presents the office today for evaluation of wounds to both of her big toes.  She states that she has been soaking it in Epsom salts a couple times a week.  She keeps Medihoney on the left side and antibiotic ointment on the right side.  She denies any drainage or pus.  She does feel that the wounds are becoming more superficial.  She states that the wound to the top of her left big toe for which remove the toenail is healed.  She states that she is on her feet quite a bit and this is was causing the wound not the heel.  I agree with her on this.  She is asking for intermittent FMLA to be extended.  Denies any systemic complaints such as fevers, chills, nausea, vomiting. No acute changes since last appointment, and no other complaints at this time.   Objective: AAO x3, NAD DP/PT pulses palpable bilaterally, CRT less than 3 seconds To the plantar aspect of bilateral hallux continues to be ulcerations.  On the plantar aspect of the right side measures approximately 0.8 x 0.8 cm on the left side measures 0.3 x 0.3 cm.  Both appear to be more superficial.  There is no surrounding erythema, ascending cellulitis.  There is no fluctuation or crepitation or any malodor.  Hyperkeratotic periwound.  There is no probing, undermining or tunneling.  No fluctuation or crepitation or any malodor.  Dorsal aspect the left hallux machine to the toenail that site is healed.  No other open lesions are identified. No open lesions or pre-ulcerative lesions.  No pain with calf compression, swelling, warmth, erythema  Assessment: Chronic hallux ulcerations bilaterally  Plan: -All treatment options discussed with the patient including all alternatives, risks, complications.  -I sharply debrided both of the wound today without any complications to healthy, granular tissue utilizing #312 blade scalpel after verbal consent was obtained and the area was cleaned with alcohol.  I want her to continue daily  dressing changes.  She should also change them twice a day in the morning before work and after work.  She can wash with soap and water and will use a small amount of antibiotic ointment to the area daily.  I did modify both of her diabetic inserts to help take more pressure off of both of these areas. -We will extend her intermittent FMLA.  She should take a 10-minute break when needed and she will have a flareup approximately once a week and need weekly to biweekly appointments with me. -Follow-up in 2 weeks or sooner if needed. -Patient encouraged to call the office with any questions, concerns, change in symptoms.   Trula Slade DPM

## 2018-02-01 ENCOUNTER — Ambulatory Visit (INDEPENDENT_AMBULATORY_CARE_PROVIDER_SITE_OTHER): Payer: PRIVATE HEALTH INSURANCE | Admitting: Podiatry

## 2018-02-01 ENCOUNTER — Ambulatory Visit: Payer: PRIVATE HEALTH INSURANCE | Admitting: Podiatry

## 2018-02-01 ENCOUNTER — Encounter: Payer: Self-pay | Admitting: Podiatry

## 2018-02-01 VITALS — Temp 97.9°F

## 2018-02-01 DIAGNOSIS — E1149 Type 2 diabetes mellitus with other diabetic neurological complication: Secondary | ICD-10-CM

## 2018-02-01 DIAGNOSIS — L97522 Non-pressure chronic ulcer of other part of left foot with fat layer exposed: Secondary | ICD-10-CM

## 2018-02-01 DIAGNOSIS — L603 Nail dystrophy: Secondary | ICD-10-CM

## 2018-02-01 DIAGNOSIS — L97511 Non-pressure chronic ulcer of other part of right foot limited to breakdown of skin: Secondary | ICD-10-CM

## 2018-02-04 NOTE — Progress Notes (Signed)
Subjective: [name] presents the office today for evaluation of wounds to both of her big toes.  She states that she is continue to clean the wounds daily with Epson salt paste type that she cleans.  She has been keeping dressings on the wounds daily.  She states that she been very active and doing a lot on her feet.  She is also recently gone to the beach but she does that she try to keep a Band-Aid over the area.  She denies any drainage or pus coming from the area she denies any swelling or red streaks.  She did heal from the where the nail was removed the left hallux and she wants to have the right hallux toenail removed because she thinks that this caused her to walk differently and that is why the wound on the right side continues.  The toenail is thick and causing pain.  Denies any systemic complaints such as fevers, chills, nausea, vomiting. No acute changes since last appointment, and no other complaints at this time.   Objective: AAO x3, NAD DP/PT pulses palpable bilaterally, CRT less than 3 seconds To the plantar aspect of bilateral hallux continues to be ulcerations.  On the plantar aspect of the right side measures approximately 0.7 x 0.5 cm on the left side measures 0.5 x 0.5 cm.  Both appear to be  superficial there is no probing, undermining or tunneling.  There is no surrounding erythema, ascending cellulitis.  There is no fluctuation or crepitation or any malodor.  No other open lesions identified. The right hallux toenail is hypertrophic, dystrophic, discolored and there is subjective tenderness to the right hallux toenail.  There is no redness or drainage or any swelling to the toenail. No other open lesions or pre-ulcerative lesions.  No pain with calf compression, swelling, warmth, erythema  Assessment: Chronic hallux ulcerations bilaterally; right hallux onychodystrophy  Plan: -All treatment options discussed with the patient including all alternatives, risks, complications.   -I sharply debrided both of the wound today without any complications to healthy, granular tissue utilizing #312 blade scalpel after verbal consent was obtained and the area was cleaned with alcohol. We are going to switch to Prisma dressing changes daily.  This was dispensed to her today.  Continue offloading at all times and to limit activity.  We discussed nail removal of the right hallux toenail and that she did heal well on the left side we will do this on the right side next appointment. -Monitor for any clinical signs or symptoms of infection and directed to call the office immediately should any occur or go to the ER. -RTC 1 week or sooner if needed  Trula Slade DPM

## 2018-02-08 ENCOUNTER — Encounter: Payer: Self-pay | Admitting: Podiatry

## 2018-02-08 ENCOUNTER — Ambulatory Visit (INDEPENDENT_AMBULATORY_CARE_PROVIDER_SITE_OTHER): Payer: PRIVATE HEALTH INSURANCE | Admitting: Podiatry

## 2018-02-08 DIAGNOSIS — M79674 Pain in right toe(s): Secondary | ICD-10-CM

## 2018-02-08 DIAGNOSIS — L97522 Non-pressure chronic ulcer of other part of left foot with fat layer exposed: Secondary | ICD-10-CM

## 2018-02-08 DIAGNOSIS — L97511 Non-pressure chronic ulcer of other part of right foot limited to breakdown of skin: Secondary | ICD-10-CM | POA: Diagnosis not present

## 2018-02-08 DIAGNOSIS — L603 Nail dystrophy: Secondary | ICD-10-CM

## 2018-02-08 NOTE — Patient Instructions (Signed)

## 2018-02-12 NOTE — Progress Notes (Signed)
Subjective: [name] presents the office today for follow-up evaluation of ulcerations to the bottom of both of her big toes.  She said the areas have gotten better.  She is also asking for the right big toenail removed permanently as the nails being thickened she thinks that she is walking differently and causing her to walk differently which is likely causing the wound to the bottom of the toe.  She did take off her left big toenail herself and the area healed uneventfully. Denies any systemic complaints such as fevers, chills, nausea, vomiting. No acute changes since last appointment, and no other complaints at this time.   Objective: AAO x3, NAD DP/PT pulses palpable bilaterally, CRT less than 3 seconds Right hallux toenails hypertrophic, dystrophic, discolored with ill-defined discoloration.  No swelling redness or drainage there is tenderness palpation of the nail. The plantar aspect of bilateral hallux continues to be ulcerations.  The right side appears to be smaller 0.5 x 0.5 cm on the left side of 0.4 x 0.4 cm.  Both are superficial without any probing, undermining or tunneling.  No swelling erythema, edema, ascending cellulitis.  There is no fluctuation or crepitation or any malodor. No open lesions or pre-ulcerative lesions.  No pain with calf compression, swelling, warmth, erythema  Assessment: 47 year old female with right symptomatically dystrophy, bilateral hallux ulcerations  Plan: -All treatment options discussed with the patient including all alternatives, risks, complications.  -Today she is requesting a right big toenail be removed permanently.  Discussed the procedure as well as the postoperative course and discussed the risks of this including nonhealing amputation of the toe.  Discussed importance of glucose control. At this time, the patient is requesting total nail removal with chemical matricectomy to the symptomatic portion of the nail. Risks and complications were discussed  with the patient for which they understand and written consent was obtained. Under sterile conditions a total of 3 mL of a mixture of 2% lidocaine plain and 0.5% Marcaine plain was infiltrated in a hallux block fashion. Once anesthetized, the skin was prepped in sterile fashion. A tourniquet was then applied. Next the right hallux nail was then sharply excised making sure to remove the entire offending nail border. Once the nails were ensured to be removed area was debrided and the underlying skin was intact. There is no purulence identified in the procedure. Next phenol was then applied under standard conditions and copiously irrigated. Silvadene was applied. A dry sterile dressing was applied. After application of the dressing the tourniquet was removed and there is found to be an immediate capillary refill time to the digit. The patient tolerated the procedure well any complications. Post procedure instructions were discussed the patient for which he verbally understood. Follow-up in one week for nail check or sooner if any problems are to arise. Discussed signs/symptoms of infection and directed to call the office immediately should any occur or go directly to the emergency room. In the meantime, encouraged to call the office with any questions, concerns, changes symptoms. -Continue daily dressing changes to the plantar hallux ulcerations.  She will continue with Iantha Fallen DPM   -Patient encouraged to call the office with any questions, concerns, change in symptoms.

## 2018-02-15 ENCOUNTER — Encounter: Payer: Self-pay | Admitting: Podiatry

## 2018-02-15 ENCOUNTER — Ambulatory Visit (INDEPENDENT_AMBULATORY_CARE_PROVIDER_SITE_OTHER): Payer: PRIVATE HEALTH INSURANCE | Admitting: Podiatry

## 2018-02-15 DIAGNOSIS — L97511 Non-pressure chronic ulcer of other part of right foot limited to breakdown of skin: Secondary | ICD-10-CM

## 2018-02-15 DIAGNOSIS — L97522 Non-pressure chronic ulcer of other part of left foot with fat layer exposed: Secondary | ICD-10-CM | POA: Diagnosis not present

## 2018-02-18 NOTE — Progress Notes (Signed)
Subjective: 47 year old female presents the office today for follow-up evaluation status post right total nail avulsion as well as for ulcerations the bottoms of both her big toes.  She states that she has been putting a mixture of Medihoney and Prisma onto the wounds.  She states the wounds are doing at the same.  She states that she has been on her feet she is been very active at work.  She knows that the wounds are not healing because the amount of time that she is on her feet and her activity level.  She denies any drainage or pus and denies any swelling or redness. Denies any systemic complaints such as fevers, chills, nausea, vomiting. No acute changes since last appointment, and no other complaints at this time.   Objective: AAO x3, NAD DP/PT pulses palpable bilaterally, CRT less than 3 seconds Right hallux nail bed appears to be clean and dry and scab is starting to form mild granulation tissue is present.  There is no edema, erythema, drainage or pus.  No fluctuation or crepitation is no ascending cellulitis. On the plantar aspects of bilateral hallux are continue to be ulcerations which measure almost the exact same size.  On the right side after debridement measures 0.5 x 0.5 x 0.1 cm.  On the left side after debridement measures 0.3 x 0.3 cm.  Both wounds are superficial with a granular wound base there is no probing, undermining or tunneling.  There is no surrounding erythema, ascending cellulitis.  No fluctuation or crepitation or any malodor. No open lesions or pre-ulcerative lesions.  No pain with calf compression, swelling, warmth, erythema  Assessment: Healing procedure site right hallux toenail with chronic ulcerations bilateral plantar hallux  Plan: -All treatment options discussed with the patient including all alternatives, risks, complications.  -Continue to wash the right nailbed with soap and water and apply a small amount of antibiotic ointment and dressing daily. -She will  be debrided bilateral hallux ulcerations without any complications of bleeding.  Recommend use only the Medihoney at this time. Offloading at all times.  No signs of infection today however to continue to monitor closely.  -RTC 1 week or sooner if needed. We will stay on a weekly schedule until the wounds heal some more. She gets callus over the area quickly.  -Patient encouraged to call the office with any questions, concerns, change in symptoms.   Trula Slade DPM

## 2018-02-22 ENCOUNTER — Ambulatory Visit (INDEPENDENT_AMBULATORY_CARE_PROVIDER_SITE_OTHER): Payer: PRIVATE HEALTH INSURANCE | Admitting: Podiatry

## 2018-02-22 ENCOUNTER — Encounter: Payer: Self-pay | Admitting: Podiatry

## 2018-02-22 DIAGNOSIS — L97522 Non-pressure chronic ulcer of other part of left foot with fat layer exposed: Secondary | ICD-10-CM | POA: Diagnosis not present

## 2018-02-22 DIAGNOSIS — L97511 Non-pressure chronic ulcer of other part of right foot limited to breakdown of skin: Secondary | ICD-10-CM

## 2018-02-22 NOTE — Progress Notes (Signed)
Subjective: [name] presents the office today for evaluation of wounds to both her big toes.  She states that she did keep the bandages on all day yesterday and she fell asleep in the chair and she has noticed some macerated tissue on the right side.  She denies any increase in swelling or redness.  She did have a day where she noticed some bleeding to the wound on the right side.  She denies any drainage or pus otherwise.  Denies any red streaks.  No pain.  No other concerns. Denies any systemic complaints such as fevers, chills, nausea, vomiting. No acute changes since last appointment, and no other complaints at this time.   Objective: AAO x3, NAD DP/PT pulses palpable bilaterally, CRT less than 3 seconds On the plantar aspects of bilateral hallux continues to be ulcerations.  On the left side plantar IPJ is a hyperkeratotic lesion with a central ulceration.  Same on the right side.  On the left side the wound measures 0.3 x 0.3 x 0.1 cm and the right side is 0.5 x 1 cm.  The right side is larger.  Along the nailbed on the right hallux there is a small amount of granulation tissue but some macerated tissue present.  There is no drainage or pus.  No edema, erythema, ascending cellulitis.  No malodor. No open lesions or pre-ulcerative lesions.  No pain with calf compression, swelling, warmth, erythema  Assessment: Chronic ulcerations bilaterally  Plan: -All treatment options discussed with the patient including all alternatives, risks, complications.  -Sharply debrided the wound without any complications utilizing #175 blade scalpel to healthy, granular tissue.  We will put Betadine on the right side given the macerated tissue and continue with meta honey on the left side.  Continue offloading at all times. -Monitor for any clinical signs or symptoms of infection and directed to call the office immediately should any occur or go to the ER. -Patient encouraged to call the office with any questions,  concerns, change in symptoms.   Trula Slade DPM

## 2018-02-28 ENCOUNTER — Encounter: Payer: Self-pay | Admitting: Podiatry

## 2018-02-28 ENCOUNTER — Ambulatory Visit (INDEPENDENT_AMBULATORY_CARE_PROVIDER_SITE_OTHER): Payer: PRIVATE HEALTH INSURANCE | Admitting: Podiatry

## 2018-02-28 DIAGNOSIS — L97522 Non-pressure chronic ulcer of other part of left foot with fat layer exposed: Secondary | ICD-10-CM | POA: Diagnosis not present

## 2018-02-28 DIAGNOSIS — L97511 Non-pressure chronic ulcer of other part of right foot limited to breakdown of skin: Secondary | ICD-10-CM

## 2018-03-04 NOTE — Progress Notes (Signed)
Subjective: [name] presents the office today for evaluation of wounds to both her big toes.  She thinks that both toes are looking much better.  She denies any drainage or pus or any increase in redness or swelling.  She has been continuing with daily dressing changes and she has been putting [place] on the wounds.  Denies any drainage of pus and red streaks.  No other concerns. Denies any systemic complaints such as fevers, chills, nausea, vomiting. No acute changes since last appointment, and no other complaints at this time.   Objective: AAO x3, NAD DP/PT pulses palpable bilaterally, CRT less than 3 seconds On the plantar aspects of bilateral hallux continues to be ulcerations.  On the left side plantar IPJ is a hyperkeratotic lesion with a central ulceration.  Same on the right side.  On the left side the wound measures 0.3 x 0.3 x 0.1 cm and the right side is 0.5 x 0.6 cm.   Along the nailbed on the right hallux there is a small amount of granulation tissue still present however appears to be almost healed.  There is no drainage or pus.  No edema, erythema, ascending cellulitis.  No malodor. No open lesions or pre-ulcerative lesions.  No pain with calf compression, swelling, warmth, erythema  Assessment: Chronic ulcerations bilaterally  Plan: -All treatment options discussed with the patient including all alternatives, risks, complications.  -Sharply debrided the wound without any complications utilizing #801 blade scalpel to healthy, granular tissue.  Today applied a small amount of Betadine gel to the area. Can continue with MediHoney on the wounds.  again discussed importance of offloading and try to limit activity however given her job she is on her feet quite a bit. -Monitor for any clinical signs or symptoms of infection and directed to call the office immediately should any occur or go to the ER. -Patient encouraged to call the office with any questions, concerns, change in symptoms.    Trula Slade DPM

## 2018-03-08 ENCOUNTER — Encounter: Payer: Self-pay | Admitting: Podiatry

## 2018-03-08 ENCOUNTER — Ambulatory Visit (INDEPENDENT_AMBULATORY_CARE_PROVIDER_SITE_OTHER): Payer: PRIVATE HEALTH INSURANCE | Admitting: Podiatry

## 2018-03-08 DIAGNOSIS — L97511 Non-pressure chronic ulcer of other part of right foot limited to breakdown of skin: Secondary | ICD-10-CM

## 2018-03-08 DIAGNOSIS — L97522 Non-pressure chronic ulcer of other part of left foot with fat layer exposed: Secondary | ICD-10-CM

## 2018-03-08 NOTE — Progress Notes (Signed)
Subjective: [name] presents the office today for evaluation of wounds to both [name] big toes.  She has no new concerns today.  She has been putting Betadine gel on the wounds and she denies any drainage or pus coming from the area.  She does have the wounds were gotten somewhat smaller.  She has no new concerns. Denies any systemic complaints such as fevers, chills, nausea, vomiting. No acute changes since last appointment, and no other complaints at this time.   Objective: AAO x3, NAD DP/PT pulses palpable bilaterally, CRT less than 3 seconds On the plantar aspects of bilateral hallux continues to be ulcerations.  It does appear that the ulcerations are getting better.  The left side of 0.2 x 0.2 x 0.1 cm.  On the right side it is about 0.5 x 0.4 cm.  There is no probing to bone, undermining or tunneling.  There is no surrounding erythema, edema, drainage or pus.  No fluctuation or crepitation or any ascending cellulitis.  The nailbed on the right has healed and there is a scab on the area and there is no open sore identified at this time there is no drainage or pus.  No other open lesions identified. No open lesions or pre-ulcerative lesions.  No pain with calf compression, swelling, warmth, erythema  Assessment: Chronic ulcerations bilaterally  Plan: -All treatment options discussed with the patient including all alternatives, risks, complications.  -Sharply debrided the wound without any complications utilizing #809 blade scalpel to healthy, granular tissue.  Today applied a small amount of Betadine gel to the area.  She is getting better results of the Betadine she also can continue with this. -Monitor for any clinical signs or symptoms of infection and directed to call the office immediately should any occur or go to the ER. -Patient encouraged to call the office with any questions, concerns, change in symptoms.   Trula Slade DPM

## 2018-03-15 ENCOUNTER — Encounter: Payer: Self-pay | Admitting: Podiatry

## 2018-03-15 ENCOUNTER — Ambulatory Visit (INDEPENDENT_AMBULATORY_CARE_PROVIDER_SITE_OTHER): Payer: PRIVATE HEALTH INSURANCE | Admitting: Podiatry

## 2018-03-15 DIAGNOSIS — L97511 Non-pressure chronic ulcer of other part of right foot limited to breakdown of skin: Secondary | ICD-10-CM | POA: Diagnosis not present

## 2018-03-15 DIAGNOSIS — L97522 Non-pressure chronic ulcer of other part of left foot with fat layer exposed: Secondary | ICD-10-CM

## 2018-03-15 NOTE — Progress Notes (Signed)
Subjective: [name] presents the office today for evaluation of wounds to both her big toes.  She states that overall she is doing well.  Has been keeping the Betadine gel on the area.  She denies any drainage or pus and she denies any redness or swelling or any red streaks.  She has no new concerns.  She states that she feels going to getting better. Denies any systemic complaints such as fevers, chills, nausea, vomiting. No acute changes since last appointment, and no other complaints at this time.   Objective: AAO x3, NAD DP/PT pulses palpable bilaterally, CRT less than 3 seconds On the plantar aspects of bilateral hallux continues to be ulcerations.  On the plantar aspect of bilateral hallux, the ulceration.  On the left plantar hallux is almost a pinpoint type lesion and is superficial with a granular base.  On the right side there appears to be ulceration with hyperkeratotic periwound.  I did excisionally debride this.  Today it was slightly larger at 0.5 x 0.5 cm and has a depth of only 0.1.  Has a nice granular wound base present.  There is no surrounding erythema, ascending cellulitis.  There is no fluctuation or crepitation or any malodor. No open lesions or pre-ulcerative lesions.  No pain with calf compression, swelling, warmth, erythema  Assessment: Chronic ulcerations bilaterally  Plan: -All treatment options discussed with the patient including all alternatives, risks, complications.  -I did sharply debride the wound bilaterally and on the right side to the excisionally debride the right hallux wound with a #312 blade scalpel to healthy, granular tissue.  Continue Betadine gel dressing changes daily.  Continue offloading at all times.  No signs of infection will continue to monitor closely. -RTC 2 weeks or sooner if needed  Trula Slade DPM

## 2018-03-29 ENCOUNTER — Encounter: Payer: Self-pay | Admitting: Podiatry

## 2018-03-29 ENCOUNTER — Ambulatory Visit (INDEPENDENT_AMBULATORY_CARE_PROVIDER_SITE_OTHER): Payer: PRIVATE HEALTH INSURANCE | Admitting: Podiatry

## 2018-03-29 VITALS — Temp 97.8°F

## 2018-03-29 DIAGNOSIS — L97511 Non-pressure chronic ulcer of other part of right foot limited to breakdown of skin: Secondary | ICD-10-CM | POA: Diagnosis not present

## 2018-03-29 DIAGNOSIS — L97522 Non-pressure chronic ulcer of other part of left foot with fat layer exposed: Secondary | ICD-10-CM | POA: Diagnosis not present

## 2018-03-31 NOTE — Progress Notes (Signed)
Subjective: [name] presents the office today for evaluation of wounds to both her big toes.  She has been keeping the Medihoney on the wounds.  She does feel that the wounds are getting better.  Denies any drainage or pus and denies any swelling or redness.  She has no new concerns  Objective: AAO x3, NAD DP/PT pulses palpable bilaterally, CRT less than 3 seconds On the plantar aspects of bilateral hallux continues to be ulcerations however they appear to be smaller today and there is less hyperkeratotic tissue.  There is no drainage or pus there is no swelling erythema, ascending cellulitis.  There is no fluctuation or crepitation or malodor.  There is no clinical signs of infection noted today. No open lesions or pre-ulcerative lesions.  No pain with calf compression, swelling, warmth, erythema  Assessment: Chronic ulcerations bilaterally  Plan: -All treatment options discussed with the patient including all alternatives, risks, complications.  -I did sharply debride the wound bilaterally and on the right side to the excisionally debride the right hallux wound with a #312 blade scalpel to healthy, granular tissue.  Continue Medihoney  dressing changes daily.  Continue offloading at all times.  No signs of infection will continue to monitor closely. -RTC 2 weeks or sooner if needed  [name] DPM

## 2018-04-12 ENCOUNTER — Encounter: Payer: Self-pay | Admitting: Podiatry

## 2018-04-12 ENCOUNTER — Ambulatory Visit (INDEPENDENT_AMBULATORY_CARE_PROVIDER_SITE_OTHER): Payer: PRIVATE HEALTH INSURANCE | Admitting: Podiatry

## 2018-04-12 ENCOUNTER — Ambulatory Visit (INDEPENDENT_AMBULATORY_CARE_PROVIDER_SITE_OTHER): Payer: PRIVATE HEALTH INSURANCE

## 2018-04-12 DIAGNOSIS — L97511 Non-pressure chronic ulcer of other part of right foot limited to breakdown of skin: Secondary | ICD-10-CM

## 2018-04-12 DIAGNOSIS — L97522 Non-pressure chronic ulcer of other part of left foot with fat layer exposed: Secondary | ICD-10-CM | POA: Diagnosis not present

## 2018-04-12 DIAGNOSIS — L02611 Cutaneous abscess of right foot: Secondary | ICD-10-CM

## 2018-04-12 DIAGNOSIS — L03031 Cellulitis of right toe: Secondary | ICD-10-CM | POA: Diagnosis not present

## 2018-04-12 MED ORDER — CLINDAMYCIN HCL 300 MG PO CAPS: 300.0000 mg | ORAL_CAPSULE | Freq: Three times a day (TID) | ORAL | 2 refills | Status: DC

## 2018-04-12 MED ORDER — CIPROFLOXACIN HCL 500 MG PO TABS: 500.0000 mg | ORAL_TABLET | Freq: Two times a day (BID) | ORAL | 0 refills | Status: DC

## 2018-04-15 NOTE — Progress Notes (Signed)
Subjective: 47 year old female presents the office today for follow-up evaluation of wounds to both of her big toes.  She states the left side is doing very well however the right side is starting to get red this week.  She denies any pus coming from the area.  She has had some swelling to the toe on the right side.  She has no other concerns today. Denies any systemic complaints such as fevers, chills, nausea, vomiting. No acute changes since last appointment, and no other complaints at this time.   Objective: AAO x3, NAD DP/PT pulses palpable bilaterally, CRT less than 3 seconds On the left plantar hallux is a hyperkeratotic lesion with a central granular wound which is much improved.  Almost a pinpoint type lesion and there is no edema, erythema, drainage or pus or any signs of infection.  On the right side however the wound appears to be larger measuring 0.5 x 0.5 cm with hyperkeratotic tissue on the periphery.  Upon debridement the wound appears to be superficial with a granular base however there is edema erythema to the hallux although mild.  There is no fluctuation or crepitation or any malodor.  Mild swelling to the right foot compared to the contralateral extremity.  No open lesions or pre-ulcerative lesions.  No pain with calf compression, swelling, warmth, erythema  Assessment: Bilateral hallux ulcerations with right foot cellulitis  Plan: -All treatment options discussed with the patient including all alternatives, risks, complications.  -X-rays were obtained reviewed in the right foot.  No soft tissue disease and there is no definitive cortical destruction suggestive of osteomyelitis at this time. -I sharply debrided the wounds bilaterally with a #312 blade scalpel without any complications and to healthy, granular tissue.  Continue with antibiotic ointment dressing changes for now.  We will start clindamycin and ciprofloxacin which is in the pharmacy today.  Offloading at all times.   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 -Monitor for any clinical signs or symptoms of infection and directed to call the office immediately should any occur or go to the ER. -Patient encouraged to call the office with any questions, concerns, change in symptoms.   [name] DPM

## 2018-04-16 ENCOUNTER — Ambulatory Visit (INDEPENDENT_AMBULATORY_CARE_PROVIDER_SITE_OTHER): Payer: PRIVATE HEALTH INSURANCE | Admitting: Podiatry

## 2018-04-16 ENCOUNTER — Encounter: Payer: Self-pay | Admitting: Podiatry

## 2018-04-16 DIAGNOSIS — L03031 Cellulitis of right toe: Secondary | ICD-10-CM

## 2018-04-16 DIAGNOSIS — L02611 Cutaneous abscess of right foot: Secondary | ICD-10-CM

## 2018-04-16 DIAGNOSIS — L97522 Non-pressure chronic ulcer of other part of left foot with fat layer exposed: Secondary | ICD-10-CM | POA: Diagnosis not present

## 2018-04-16 DIAGNOSIS — L97511 Non-pressure chronic ulcer of other part of right foot limited to breakdown of skin: Secondary | ICD-10-CM | POA: Diagnosis not present

## 2018-04-17 NOTE — Progress Notes (Signed)
Subjective: 47 year old female presents the office today for follow-up evaluation of wounds to both of her big toes. She presents for check of infection on the right foot. She is on the antibiotics. 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 She reports decreased edema and erythema on the right. No pus. She is continuing with dressing changes. No issues with the antibiotics. No other concerns.    Objective: AAO x3, NAD DP/PT pulses palpable bilaterally, CRT less than 3 seconds On the left plantar hallux is a hyperkeratotic lesion with a central granular wound which is much improved.  Almost a pinpoint type lesion and there is no edema, erythema, drainage or pus or any signs of infection.  This is unchanged. On the right side however the wound appears to be about the same size after debridement measuring 0.5 x 0.5 cm with mild hyperkeratotic tissue on the periphery.  Upon debridement the wound appears to be superficial with a granular base. There is decreased erythema and there is no increase in warmth to the foot. No pus.  There is no fluctuation or crepitation or any malodor.  Decreased swelling to the right foot compared to the contralateral extremity.   No new open lesions or pre-ulcerative lesions.  No pain with calf compression, swelling, warmth, erythema  Assessment: Bilateral hallux ulcerations with improving right foot cellulitis  Plan: -All treatment options discussed with the patient including all alternatives, risks, complications.  -I sharply debrided the wound on the right foot with a #312 blade scalpel without any complications and to healthy, granular tissue.  Continue with antibiotic ointment dressing changes for now.  Finish course clindamycin and ciprofloxacin. I did not do a culture as there is no drainage and this would be a superficial culture. Offloading at all times.  -Continue desk/liteduty at work.   -Monitor for any clinical signs or symptoms of infection and directed to call the office immediately should any occur or go to the ER. -Patient encouraged to call the office with any questions, concerns, change in symptoms.   Trula Slade DPM

## 2018-04-19 ENCOUNTER — Encounter: Payer: Self-pay | Admitting: Podiatry

## 2018-04-19 ENCOUNTER — Ambulatory Visit (INDEPENDENT_AMBULATORY_CARE_PROVIDER_SITE_OTHER): Payer: PRIVATE HEALTH INSURANCE | Admitting: Podiatry

## 2018-04-19 DIAGNOSIS — L97511 Non-pressure chronic ulcer of other part of right foot limited to breakdown of skin: Secondary | ICD-10-CM | POA: Diagnosis not present

## 2018-04-19 DIAGNOSIS — R2681 Unsteadiness on feet: Secondary | ICD-10-CM

## 2018-04-19 DIAGNOSIS — L97522 Non-pressure chronic ulcer of other part of left foot with fat layer exposed: Secondary | ICD-10-CM | POA: Diagnosis not present

## 2018-04-19 DIAGNOSIS — L02611 Cutaneous abscess of right foot: Secondary | ICD-10-CM

## 2018-04-19 DIAGNOSIS — L03031 Cellulitis of right toe: Secondary | ICD-10-CM

## 2018-04-22 ENCOUNTER — Telehealth: Payer: Self-pay | Admitting: *Deleted

## 2018-04-22 NOTE — Telephone Encounter (Signed)
Pt states she went to the ED this weekend with very bad itching rash, they believe to be from Cipro or Keflex, give DepoMedrol, benadry x2, epinephrine and oral Prednisone. Pt states she has not taken anymore of the antibiotics since Friday, and the ulcer looks stable.

## 2018-04-22 NOTE — Telephone Encounter (Signed)
The wound look good when I last saw her and there was no cellulitis. If there is no increase in swelling, drainage or other signs of infection will hold antibiotics until I see her back.

## 2018-04-22 NOTE — Telephone Encounter (Signed)
Left message informing pt of Dr. Wagoner's orders. 

## 2018-04-24 NOTE — Progress Notes (Signed)
Subjective: 47 year old female presents the office today for follow-up evaluation of wounds to both of her big toes as well as her cellulitis in the right foot.  She states that she is on the antibiotics without any issues.  She states that the wounds are getting better and she has noticed significant improvement in the redness and swelling and she is not having any pain.  She tries the upper foot as much as possible. Denies any systemic complaints such as fevers, chills, nausea, vomiting. No acute changes since last appointment, and no other complaints at this time.   Objective: AAO x3, NAD DP/PT pulses palpable bilaterally, CRT less than 3 seconds Ulcerations continue to plantar hallux bilaterally.  They are about the same size compared to last appointment and she presents today to check for the cellulitis.  There is much improved erythema to the right side and there is no significant increase in warmth and there is no edema.  Overall is much improved.  Upon gait evaluation today she does walk with almost a slap foot kind of a gait.  She has adequate muscle strength.  No open lesions or pre-ulcerative lesions.  No pain with calf compression, swelling, warmth, erythema  Assessment: Improved cellulitis right foot with continued ulcerations  Plan: -All treatment options discussed with the patient including all alternatives, risks, complications.  -I did sharply debride the wounds to the any complications or bleeding.  We will continue with daily dressing changes.  I want her to finish the course of antibiotics when she finishes this we will hold off any further antibiotics that the infection appears to be much improved.  We discussed with her more of a rocker-bottom shoe in order to help offload the hallux.  Think that a lot of the reason why she is having this issues with her gait pattern. Recommended PT. Rx written for [facility].  -Patient encouraged to call the office with any questions, concerns,  change in symptoms.   Trula Slade DPM

## 2018-04-30 ENCOUNTER — Encounter: Payer: Self-pay | Admitting: Podiatry

## 2018-05-03 ENCOUNTER — Ambulatory Visit (INDEPENDENT_AMBULATORY_CARE_PROVIDER_SITE_OTHER): Payer: PRIVATE HEALTH INSURANCE | Admitting: Podiatry

## 2018-05-03 ENCOUNTER — Encounter: Payer: Self-pay | Admitting: Podiatry

## 2018-05-03 DIAGNOSIS — L97511 Non-pressure chronic ulcer of other part of right foot limited to breakdown of skin: Secondary | ICD-10-CM | POA: Diagnosis not present

## 2018-05-03 DIAGNOSIS — R2681 Unsteadiness on feet: Secondary | ICD-10-CM | POA: Diagnosis not present

## 2018-05-03 DIAGNOSIS — L97522 Non-pressure chronic ulcer of other part of left foot with fat layer exposed: Secondary | ICD-10-CM

## 2018-05-07 NOTE — Progress Notes (Signed)
Subjective: 47 year old female presents the office today for follow-up evaluation of wounds to both of her big toes as well as her cellulitis in the right foot.  Since last appointment she had to stop her antibiotics because she was getting a rash associated with the emergency room for this.  We did not restart antibiotics because she states that the redness had resolved and there is no pus or pain.  There is also no drainage that she reports.  She does continue with daily dressing changes.  She said the left foot is doing very well for the wound continues on the right side. Denies any systemic complaints such as fevers, chills, nausea, vomiting. No acute changes since last appointment, and no other complaints at this time.   Objective: AAO x3, NAD DP/PT pulses palpable bilaterally, CRT less than 3 seconds Ulcerations continue to plantar hallux bilaterally.  Upon debridement there is a very small pinpoint type lesion in the left hallux of the left side appears that was completely healed at this point.  The right side of the wound does continue measuring approximately 0.6 x 0.5 cm with a granular wound base and mild hyperkeratotic periwound.  There is no significant edema, erythema, drainage or pus present bilaterally from the wounds.  There is no other ulcerations of the foot at this time.   No pain with calf compression, swelling, warmth, erythema  Assessment: Improved cellulitis right foot with continued ulcerations  Plan: -All treatment options discussed with the patient including all alternatives, risks, complications.  -I sharply debrided the wound on the right foot without any complications utilizing #867 blade scalpel down to healthy, granular tissue.  Also debrided the hyperkeratotic lesion of the left hallux reveal the left side is almost completely healed.  We will continue with offloading the left side daily, continue with daily dressing changes in the right side.  We discussed a more rocker  type shoe.  Unfortunately given cost she could not do physical therapy to work on her gait.  Discussed limiting activity.  Trula Slade DPM

## 2018-05-09 ENCOUNTER — Telehealth: Payer: Self-pay | Admitting: Podiatry

## 2018-05-09 NOTE — Telephone Encounter (Signed)
Left message for pt to call to schedule an appt with Liliane Channel for the OTC AFO Brace that Dr Jacqualyn Posey and Liliane Channel have discussed.

## 2018-05-10 NOTE — Telephone Encounter (Signed)
Pt left voicemail at lunch time and I returned the call and she is coming in on Monday at 1115

## 2018-05-13 ENCOUNTER — Ambulatory Visit: Payer: PRIVATE HEALTH INSURANCE | Admitting: Orthotics

## 2018-05-13 ENCOUNTER — Encounter: Payer: Self-pay | Admitting: Podiatry

## 2018-05-13 DIAGNOSIS — L97511 Non-pressure chronic ulcer of other part of right foot limited to breakdown of skin: Secondary | ICD-10-CM

## 2018-05-13 DIAGNOSIS — R2681 Unsteadiness on feet: Secondary | ICD-10-CM

## 2018-05-13 DIAGNOSIS — L97522 Non-pressure chronic ulcer of other part of left foot with fat layer exposed: Secondary | ICD-10-CM

## 2018-05-13 NOTE — Progress Notes (Signed)
Order Thu[employer] Spry flex for size 8 shoe LEFT. Confirm [id number]

## 2018-05-15 ENCOUNTER — Telehealth: Payer: Self-pay | Admitting: Podiatry

## 2018-05-15 NOTE — Telephone Encounter (Signed)
Left message for pt that brace came in and I have scheduled her to see Liliane Channel this Friday 8.30.19.

## 2018-05-17 ENCOUNTER — Emergency Department (HOSPITAL_COMMUNITY): Payer: No Typology Code available for payment source

## 2018-05-17 ENCOUNTER — Encounter (HOSPITAL_COMMUNITY): Payer: Self-pay | Admitting: *Deleted

## 2018-05-17 ENCOUNTER — Ambulatory Visit (INDEPENDENT_AMBULATORY_CARE_PROVIDER_SITE_OTHER): Payer: PRIVATE HEALTH INSURANCE

## 2018-05-17 ENCOUNTER — Encounter: Payer: Self-pay | Admitting: Podiatry

## 2018-05-17 ENCOUNTER — Ambulatory Visit (INDEPENDENT_AMBULATORY_CARE_PROVIDER_SITE_OTHER): Payer: PRIVATE HEALTH INSURANCE | Admitting: Orthotics

## 2018-05-17 ENCOUNTER — Ambulatory Visit (INDEPENDENT_AMBULATORY_CARE_PROVIDER_SITE_OTHER): Payer: PRIVATE HEALTH INSURANCE | Admitting: Podiatry

## 2018-05-17 ENCOUNTER — Emergency Department (HOSPITAL_COMMUNITY)
Admission: EM | Admit: 2018-05-17 | Discharge: 2018-05-17 | Disposition: A | Discharge status: Home / Self Care | Payer: No Typology Code available for payment source | Source: Home / Self Care | Attending: Emergency Medicine | Admitting: Emergency Medicine

## 2018-05-17 ENCOUNTER — Telehealth: Payer: Self-pay | Admitting: *Deleted

## 2018-05-17 VITALS — Temp 98.2°F

## 2018-05-17 DIAGNOSIS — L97511 Non-pressure chronic ulcer of other part of right foot limited to breakdown of skin: Secondary | ICD-10-CM

## 2018-05-17 DIAGNOSIS — L97522 Non-pressure chronic ulcer of other part of left foot with fat layer exposed: Secondary | ICD-10-CM

## 2018-05-17 DIAGNOSIS — L02611 Cutaneous abscess of right foot: Secondary | ICD-10-CM

## 2018-05-17 DIAGNOSIS — A419 Sepsis, unspecified organism: Secondary | ICD-10-CM | POA: Diagnosis not present

## 2018-05-17 DIAGNOSIS — Z79899 Other long term (current) drug therapy: Secondary | ICD-10-CM | POA: Insufficient documentation

## 2018-05-17 DIAGNOSIS — L03031 Cellulitis of right toe: Secondary | ICD-10-CM

## 2018-05-17 DIAGNOSIS — E08621 Diabetes mellitus due to underlying condition with foot ulcer: Secondary | ICD-10-CM

## 2018-05-17 DIAGNOSIS — Z91040 Latex allergy status: Secondary | ICD-10-CM

## 2018-05-17 DIAGNOSIS — Z7984 Long term (current) use of oral hypoglycemic drugs: Secondary | ICD-10-CM | POA: Insufficient documentation

## 2018-05-17 DIAGNOSIS — R110 Nausea: Secondary | ICD-10-CM

## 2018-05-17 DIAGNOSIS — M2042 Other hammer toe(s) (acquired), left foot: Secondary | ICD-10-CM

## 2018-05-17 DIAGNOSIS — M2041 Other hammer toe(s) (acquired), right foot: Secondary | ICD-10-CM

## 2018-05-17 DIAGNOSIS — N390 Urinary tract infection, site not specified: Secondary | ICD-10-CM | POA: Insufficient documentation

## 2018-05-17 DIAGNOSIS — R1030 Lower abdominal pain, unspecified: Secondary | ICD-10-CM

## 2018-05-17 DIAGNOSIS — R300 Dysuria: Secondary | ICD-10-CM | POA: Insufficient documentation

## 2018-05-17 LAB — CBC
HEMATOCRIT: 47.3 % — AB (ref 36.0–46.0)
Hemoglobin: 15.1 g/dL — ABNORMAL HIGH (ref 12.0–15.0)
MCH: 29.6 pg (ref 26.0–34.0)
MCHC: 31.9 g/dL (ref 30.0–36.0)
MCV: 92.7 fL (ref 78.0–100.0)
Platelets: 332 10*3/uL (ref 150–400)
RBC: 5.1 MIL/uL (ref 3.87–5.11)
RDW: 14.5 % (ref 11.5–15.5)
WBC: 11.2 10*3/uL — ABNORMAL HIGH (ref 4.0–10.5)

## 2018-05-17 LAB — COMPREHENSIVE METABOLIC PANEL
ALBUMIN: 3.7 g/dL (ref 3.5–5.0)
ALT: 16 U/L (ref 0–44)
AST: 16 U/L (ref 15–41)
Alkaline Phosphatase: 81 U/L (ref 38–126)
Anion gap: 7 (ref 5–15)
BILIRUBIN TOTAL: 0.8 mg/dL (ref 0.3–1.2)
BUN: 6 mg/dL (ref 6–20)
CALCIUM: 9 mg/dL (ref 8.9–10.3)
CO2: 26 mmol/L (ref 22–32)
Chloride: 103 mmol/L (ref 98–111)
Creatinine, Ser: 0.62 mg/dL (ref 0.44–1.00)
GFR calc Af Amer: >60 mL/min (ref >60)
GFR calc non Af Amer: >60 mL/min (ref >60)
GLUCOSE: 234 mg/dL — AB (ref 70–99)
Potassium: 4 mmol/L (ref 3.5–5.1)
Sodium: 136 mmol/L (ref 135–145)
TOTAL PROTEIN: 8.1 g/dL (ref 6.5–8.1)

## 2018-05-17 LAB — URINALYSIS, ROUTINE W REFLEX MICROSCOPIC
Bilirubin Urine: NEGATIVE
Glucose, UA: >=500 mg/dL — AB
Ketones, ur: 5 mg/dL — AB
NITRITE: NEGATIVE
Protein, ur: NEGATIVE mg/dL
SPECIFIC GRAVITY, URINE: 1.029 (ref 1.005–1.030)
pH: 5 (ref 5.0–8.0)

## 2018-05-17 LAB — I-STAT CG4 LACTIC ACID, ED
LACTIC ACID, VENOUS: 0.7 mmol/L (ref 0.5–1.9)
Lactic Acid, Venous: 1.1 mmol/L (ref 0.5–1.9)

## 2018-05-17 LAB — I-STAT BETA HCG BLOOD, ED (MC, WL, AP ONLY)

## 2018-05-17 LAB — CBG MONITORING, ED: GLUCOSE-CAPILLARY: 228 mg/dL — AB (ref 70–99)

## 2018-05-17 LAB — LIPASE, BLOOD: Lipase: 29 U/L (ref 11–51)

## 2018-05-17 MED ORDER — MORPHINE SULFATE (PF) 4 MG/ML IV SOLN
4.0000 mg | Freq: Once | INTRAVENOUS | Status: AC
  Administered 2018-05-17: 4 mg via INTRAVENOUS
  Filled 2018-05-17: qty 1

## 2018-05-17 MED ORDER — AMOXICILLIN-POT CLAVULANATE 875-125 MG PO TABS: 1.0000 | ORAL_TABLET | Freq: Two times a day (BID) | ORAL | 0 refills | Status: DC

## 2018-05-17 MED ORDER — SODIUM CHLORIDE 0.9 % IV BOLUS
1000.0000 mL | Freq: Once | INTRAVENOUS | Status: AC
  Administered 2018-05-17: 1000 mL via INTRAVENOUS

## 2018-05-17 MED ORDER — SODIUM CHLORIDE 0.9 % IV SOLN
3.0000 g | Freq: Once | INTRAVENOUS | Status: AC
  Administered 2018-05-17: 3 g via INTRAVENOUS
  Filled 2018-05-17: qty 3

## 2018-05-17 NOTE — ED Provider Notes (Signed)
[facility] EMERGENCY DEPARTMENT Provider Note   CSN: [id number] Arrival date & time: 05/17/18  0831     History   Chief Complaint Chief Complaint  Patient presents with  . Foot Pain  . Abdominal Pain    HPI [name] is a 47 y.o. female with a past medical history of T2DM, SVT, history of diabetic foot ulcer of the left foot, who presents to ED for evaluation of multiple complaints.  Her first complaint is suprapubic abdominal discomfort for the past 12 hours.  She reports nausea but denies any vomiting.  States that pain will radiate to bilateral flanks.  She states that she has been septic from pyelonephritis in the past.  She has taken ibuprofen with no improvement in her symptoms.  She reports mild dysuria.  She is at the end of her current menstrual cycle and reports bleeding and discharge associated with that, nothing different from her baseline.  She does not usually get pelvic pain with her menstrual cycle.  She reports having a normal bowel movement yesterday.  Denies any sick contacts with similar symptoms.  Denies any suspicious food ingestions, fever, recent travel. Her next complaint is diabetic ulcer on her right great toe.  She has had a history in her left foot in the past.  She sees podiatry who she follows up with every 2 weeks.  She saw her podiatrist this morning who debrided the wound, states that x-ray was done.  He felt that she would need to be evaluated in the ED for her foot pain and possibly given 1 dose of IV antibiotics.  He discharged her home with a prescription for Augmentin.  She denies any fevers.  HPI  Past Medical History:  Diagnosis Date  . Atrial fibrillation ([place])   . Diabetes mellitus     Patient Active Problem List   Diagnosis Date Noted  . Ulcer of foot, limited to breakdown of skin ([place]) 06/22/2017  . Fracture of base of fifth metatarsal bone with routine healing 06/22/2017  . SVT (supraventricular tachycardia)  ([place]) 05/14/2014  . Pyelonephritis, acute 10/09/2011  . DM 12/02/2009  . Polycystic ovaries 12/02/2009  . MIGRAINE HEADACHE 12/02/2009  . IBS 12/02/2009  . PSORIASIS 12/02/2009  . MICROCYTOSIS 12/02/2009    Past Surgical History:  Procedure Laterality Date  . CESAREAN SECTION  2005  . TUBAL LIGATION       OB History   None      Home Medications    Prior to Admission medications   Medication Sig Start Date End Date Taking? Authorizing Provider  ALPRAZolam Duanne Moron) 1 MG tablet Take 1 mg by mouth at bedtime as needed for anxiety.    [provider]  amoxicillin-clavulanate (AUGMENTIN) 875-125 MG tablet Take 1 tablet by mouth 2 (two) times daily. 05/17/18   Trula Slade, DPM  cephALEXin (KEFLEX) 500 MG capsule Take 1 capsule (500 mg total) by mouth 3 (three) times daily. 09/07/17   Trula Slade, DPM  cephALEXin (KEFLEX) 500 MG capsule Take 1 capsule (500 mg total) by mouth 3 (three) times daily. 12/21/17   Trula Slade, DPM  Cholecalciferol (VITAMIN D PO) Take 5,000 Units by mouth 2 (two) times daily.    [provider]  ciprofloxacin (CIPRO) 500 MG tablet Take 1 tablet (500 mg total) by mouth 2 (two) times daily. 04/12/18   Trula Slade, DPM  clindamycin (CLEOCIN) 300 MG capsule Take 1 capsule (300 mg total) by mouth 3 (  three) times daily. 04/12/18   Trula Slade, DPM  Dapagliflozin-Metformin HCl ER (XIGDUO XR) 06-999 MG TB24 Take 2 tablets by mouth daily.    [provider]  Dulaglutide (TRULICITY) 9.62 XB/2.8UX SOPN Inject into the skin. Pt takes once a week    [provider]  FEROSUL 325 (65 Fe) MG tablet TK 1 T PO BID 12/04/17   [provider]  ibuprofen (ADVIL,MOTRIN) 200 MG tablet Take 800 mg by mouth every 6 (six) hours as needed for moderate pain.    [provider]  Insulin Degludec (TRESIBA FLEXTOUCH) 200 UNIT/ML SOPN Inject into the skin. Pt takes 120 units every night    [provider]  metFORMIN (GLUCOPHAGE) 1000 MG tablet Take 1,000 mg by mouth. 12/17/16   [provider]  norethindrone (MICRONOR,CAMILA,ERRIN) 0.35 MG tablet TK 1 T PO D 02/13/18   [provider]  oxyCODONE-acetaminophen (PERCOCET/ROXICET) 5-325 MG tablet Take 1 tablet by mouth every 4 (four) hours as needed for severe pain (Take 1 tablet by mouth every).    [provider]  PROAIR HFA 108 (90 Base) MCG/ACT inhaler INL 2 PFS PO Q 6 H PRN 12/28/17   [provider]  promethazine (PHENERGAN) 25 MG tablet Take 25 mg by mouth 3 (three) times daily. Take 1 tablet by mouth three times daily for seven days. 02/28/17   Trula Slade, DPM  silver sulfADIAZINE (SILVADENE) 1 % cream Apply 1 application topically daily. 09/07/17   Trula Slade, DPM  tobramycin (TOBREX) 0.3 % ophthalmic solution  01/22/18   [provider]  verapamil (CALAN-SR) 240 MG CR tablet TAKE 1 TABLET BY MOUTH AT BEDTIME 11/25/15   Jettie Booze, MD    Family History Family History  Problem Relation Age of Onset  . Hypertension Mother   . Diabetes Mother   . Asthma Mother   . CAD Father   . Diabetes Sister   . Diabetes Brother   . Hypertension Brother     Social History Social History   Tobacco Use  . Smoking status: Never Smoker  . Smokeless tobacco: Never Used  Substance Use Topics  . Alcohol use: No  . Drug use: No     Allergies   Ciprofloxacin; Keflex [cephalexin]; and Latex   Review of Systems Review of Systems  Constitutional: Negative for appetite change, chills and fever.  HENT: Negative for ear pain, rhinorrhea, sneezing and sore throat.   Eyes: Negative for photophobia and visual disturbance.  Respiratory: Negative for cough, chest tightness, shortness of breath and wheezing.   Cardiovascular: Negative for chest pain and palpitations.  Gastrointestinal: Positive for abdominal pain, nausea and vomiting. Negative for blood in stool, constipation and diarrhea.   Genitourinary: Positive for dysuria, flank pain and vaginal bleeding. Negative for hematuria, urgency and vaginal discharge.  Musculoskeletal: Negative for myalgias.  Skin: Negative for rash.  Neurological: Negative for dizziness, weakness and light-headedness.     Physical Exam Updated Vital Signs BP 120/78   Pulse 96   Temp 99.3 F (37.4 C) (Oral)   Resp 14   LMP 05/13/2018 Comment: tubal ligation  SpO2 98%   Physical Exam  Constitutional: She appears well-developed and well-nourished. No distress.  HENT:  Head: Normocephalic and atraumatic.  Nose: Nose normal.  Eyes: Conjunctivae and EOM are normal. Left eye exhibits no discharge. No scleral icterus.  Neck: Normal range of motion. Neck supple.  Cardiovascular: Normal rate, regular rhythm, normal heart sounds and intact distal  pulses. Exam reveals no gallop and no friction rub.  No murmur heard. Pulmonary/Chest: Effort normal and breath sounds normal. No respiratory distress.  Abdominal: Soft. Bowel sounds are normal. She exhibits no distension. There is tenderness in the suprapubic area. There is no rebound, no guarding and no CVA tenderness.  Musculoskeletal: Normal range of motion. She exhibits no edema.  Neurological: She is alert. She exhibits normal muscle tone. Coordination normal.  Skin: Skin is warm and dry. No rash noted.  No surrounding erythema of the foot, no streaking noted.  2+ DP pulse palpated.  Sensation intact to light touch of bilateral feet.  Psychiatric: She has a normal mood and affect.  Nursing note and vitals reviewed.      ED Treatments / Results  Labs (all labs ordered are listed, but only abnormal results are displayed) Labs Reviewed  COMPREHENSIVE METABOLIC PANEL - Abnormal; Notable for the following components:      Result Value   Glucose, Bld 234 (*)    All other components within normal limits  CBC - Abnormal; Notable for the following components:   WBC 11.2 (*)    Hemoglobin 15.1  (*)    HCT 47.3 (*)    All other components within normal limits  URINALYSIS, ROUTINE W REFLEX MICROSCOPIC - Abnormal; Notable for the following components:   Color, Urine STRAW (*)    Glucose, UA >=500 (*)    Hgb urine dipstick MODERATE (*)    Ketones, ur 5 (*)    Leukocytes, UA SMALL (*)    Bacteria, UA RARE (*)    All other components within normal limits  CBG MONITORING, ED - Abnormal; Notable for the following components:   Glucose-Capillary 228 (*)    All other components within normal limits  URINE CULTURE  LIPASE, BLOOD  I-STAT BETA HCG BLOOD, ED ([facility], [facility], [facility] ONLY)  I-STAT CG4 LACTIC ACID, ED  I-STAT CG4 LACTIC ACID, ED    EKG None  Radiology Dg Chest 2 View  Result Date: 05/17/2018 CLINICAL DATA:  Left-sided chest pain for 2 days and infected toes, initial encounter EXAM: CHEST - 2 VIEW COMPARISON:  None. FINDINGS: The heart size and mediastinal contours are within normal limits. Both lungs are clear. The visualized skeletal structures are unremarkable. IMPRESSION: No active cardiopulmonary disease. Electronically Signed   By: Inez Catalina M.D.   On: 05/17/2018 09:43    Procedures Procedures (including critical care time)  Medications Ordered in ED Medications  sodium chloride 0.9 % bolus 1,000 mL (1,000 mLs Intravenous New Bag/Given 05/17/18 1030)  morphine 4 MG/ML injection 4 mg (4 mg Intravenous Given 05/17/18 1028)  Ampicillin-Sulbactam (UNASYN) 3 g in sodium chloride 0.9 % 100 mL IVPB (3 g Intravenous New Bag/Given 05/17/18 1144)     Initial Impression / Assessment and Plan / ED Course  I have reviewed the triage vital signs and the nursing notes.  Pertinent labs & imaging results that were available during my care of the patient were reviewed by me and considered in my medical decision making (see chart for details).     47 year old female with past medical history of type 2 diabetes, SVT, history of diabetic foot ulcer presents to ED for multiple  complaints.  Her first complaint is suprapubic abdominal discomfort for the past 12 hours.  She reports pain radiating to bilateral flanks.  She reports some dysuria.  Denies any changes to bowel movements, vomiting.  She does endorse nausea.  She does report a history of  sepsis secondary to pyelonephritis in the past.  On physical exam there is mild tenderness palpation of the suprapubic area.  No rebound or guarding noted.  No CVA tenderness.  Lab work significant for leukocytosis at 11, CMP unremarkable.  Lipase unremarkable.  Urinalysis shows leukocytes, bacteria.  This will be sent for culture.  Chest x-ray is unremarkable.  Discuss risks and benefits of CT of the abdomen pelvis to evaluate for further pathology.  I do believe the UTI could be causing her symptoms.  Patient declines CT of abdomen pelvis stating that symptoms have improved since arrival. Doubt appendicitis, pyelonephritis or other surgical/emergent cause of symptoms. Her next complaint is foot ulcer in her right great toe.  She has had this for several weeks.  She is followed by podiatry.  She saw her podiatrist today and was given a prescription for Augmentin.  However, she states that she was sent here for IV antibiotics.  I cannot find this in any of her notes from today.  I spoke to the pharmacist regarding the concern for the infection.  She is allergic to ciprofloxacin and Keflex.  Given her 1 dose of Unasyn here.  Will advise her to continue Augmentin which will cover for UTI and wound infection.  Will advise her to return to ED for any severe worsening symptoms.  Portions of this note were generated with [employer]. Dictation errors may occur despite best attempts at proofreading.  Final Clinical Impressions(s) / ED Diagnoses   Final diagnoses:  Lower urinary tract infectious disease  Diabetic ulcer of toe of right foot associated with diabetes mellitus due to underlying condition, limited to breakdown of skin  [facility])    ED Discharge Orders    None       Delia Heady, PA-C 05/17/18 1343    Tegeler, Gwenyth Allegra, MD 05/17/18 [phone]

## 2018-05-17 NOTE — ED Notes (Signed)
Pt ambulated to restroom with steady gait.

## 2018-05-17 NOTE — Discharge Instructions (Addendum)
Take the Augmentin as prescribed by her podiatrist.  We will contact you with the results of your remaining lab work when it is available. Return to ED for worsening symptoms, worsening infection, fevers, red streaks on your foot, severe abdominal pain, changes to your bowel movements or vomiting.

## 2018-05-17 NOTE — Progress Notes (Signed)
Picked up OTC Thunsen flex spry brace; was able to don/doff and seemed pleased that it picked up her foot and had good toe clearance.

## 2018-05-17 NOTE — ED Notes (Signed)
D/c reviewed with patient 

## 2018-05-17 NOTE — ED Notes (Signed)
Pt. returned from XR. 

## 2018-05-17 NOTE — Progress Notes (Signed)
Subjective: 47 year old female presents the office today for follow-up evaluation of wounds to both of her big toes.  She noticed that last night the big toe on the right side started to feel "tight" and this morning she got up it was red and she has little to clear drainage coming from the toe.  She denies any red streaks.  Overall she does not feel well and she has an upset stomach and she feels nauseated.  She does not have any fevers or chills.  No calf pain or chest pain or shortness of breath.  Objective: AAO x3, NAD DP/PT pulses palpable bilaterally, CRT less than 3 seconds Protective sensation decreased with Simms Weinstein monofilament Ulceration present to right plantar hallux with surrounding blister.  There is localized edema and erythema to the right hallux but there is no ascending cellulitis.  There is no fluctuation or crepitation.  After debridement the wound has a granular base and has some mild probing but it does not probe to bone.  There is no pus identified.  On the left hallux there is a healing wound present plantar almost a pinpoint type lesion. No open lesions or pre-ulcerative lesions.  No pain with calf compression, swelling, warmth, erythema  Assessment: Ulcerations with localized infection right hallux  Plan: -All treatment options discussed with the patient including all alternatives, risks, complications.  -I debrided the wound so that any complications.  X-rays were obtained reviewed.  No definitive evidence of cortical destruction.  There is a small area of soft tissue air but this is corresponding to the area of the wound.  We will start Augmentin.  Discussed with her that if there is any signs or symptoms of worsening infection to go the emergency room.  Overall she does not feel well.  I am not sure this is coming from the foot.  After discussion she wished to the emergency room due to her stomach issues and not feeling well and she was having some facial  swelling.  She is to going to go to the [facility] ER. -Patient encouraged to call the office with any questions, concerns, change in symptoms.   Trula Slade DPM

## 2018-05-17 NOTE — ED Triage Notes (Signed)
Pt sent in for further evaluation of a wound to her great toe, sent for IV antibiotics, pt also c/o suprapubic abdominal pain that started last night with nausea, no distress noted

## 2018-05-17 NOTE — Telephone Encounter (Signed)
I informed [name] - [place], pt would be coming by private vehicle with symptoms of nausea, vomiting, abdominal pain, facial swelling and is being treated in our office for the wound to right 1st toe with mild infection.

## 2018-05-18 LAB — URINE CULTURE

## 2018-05-19 ENCOUNTER — Telehealth: Payer: Self-pay

## 2018-05-19 NOTE — Telephone Encounter (Signed)
Post ED Visit - Positive Culture Follow-up  Culture report reviewed by antimicrobial stewardship pharmacist:  []  Elenor Quinones, Pharm.D. []  Heide Guile, Pharm.D., BCPS AQ-ID []  Parks Neptune, Pharm.D., BCPS []  Alycia Rossetti, Pharm.D., BCPS []  [place], Pharm.D., BCPS, AAHIVP []  Legrand Como, Pharm.D., BCPS, AAHIVP [x]  Salome Arnt, PharmD, BCPS []  Johnnette Gourd, PharmD, BCPS []  Hughes Better, PharmD, BCPS []  Leeroy Cha, PharmD  Positive urine culture Treated with Augmentin, organism sensitive to the same and no further patient follow-up is required at this time.  Genia Del 05/19/2018, 9:27 AM

## 2018-05-20 ENCOUNTER — Other Ambulatory Visit: Payer: Self-pay

## 2018-05-20 ENCOUNTER — Encounter (HOSPITAL_COMMUNITY): Payer: Self-pay | Admitting: Emergency Medicine

## 2018-05-20 ENCOUNTER — Emergency Department (HOSPITAL_COMMUNITY): Payer: No Typology Code available for payment source

## 2018-05-20 ENCOUNTER — Inpatient Hospital Stay (HOSPITAL_COMMUNITY)
Admission: EM | Admit: 2018-05-20 | Discharge: 2018-05-24 | DRG: 854 | Disposition: A | Discharge status: Home Health | Payer: No Typology Code available for payment source | Attending: Internal Medicine | Admitting: Internal Medicine

## 2018-05-20 DIAGNOSIS — L0390 Cellulitis, unspecified: Secondary | ICD-10-CM

## 2018-05-20 DIAGNOSIS — L409 Psoriasis, unspecified: Secondary | ICD-10-CM | POA: Diagnosis present

## 2018-05-20 DIAGNOSIS — L97519 Non-pressure chronic ulcer of other part of right foot with unspecified severity: Secondary | ICD-10-CM | POA: Diagnosis present

## 2018-05-20 DIAGNOSIS — I471 Supraventricular tachycardia, unspecified: Secondary | ICD-10-CM | POA: Diagnosis present

## 2018-05-20 DIAGNOSIS — M86671 Other chronic osteomyelitis, right ankle and foot: Secondary | ICD-10-CM | POA: Diagnosis not present

## 2018-05-20 DIAGNOSIS — L03031 Cellulitis of right toe: Secondary | ICD-10-CM | POA: Diagnosis present

## 2018-05-20 DIAGNOSIS — Z881 Allergy status to other antibiotic agents status: Secondary | ICD-10-CM

## 2018-05-20 DIAGNOSIS — Z91040 Latex allergy status: Secondary | ICD-10-CM

## 2018-05-20 DIAGNOSIS — Z794 Long term (current) use of insulin: Secondary | ICD-10-CM | POA: Diagnosis not present

## 2018-05-20 DIAGNOSIS — A419 Sepsis, unspecified organism: Secondary | ICD-10-CM | POA: Diagnosis present

## 2018-05-20 DIAGNOSIS — N390 Urinary tract infection, site not specified: Secondary | ICD-10-CM | POA: Diagnosis present

## 2018-05-20 DIAGNOSIS — Y92230 Patient room in hospital as the place of occurrence of the external cause: Secondary | ICD-10-CM | POA: Diagnosis not present

## 2018-05-20 DIAGNOSIS — E11621 Type 2 diabetes mellitus with foot ulcer: Secondary | ICD-10-CM | POA: Diagnosis present

## 2018-05-20 DIAGNOSIS — Z888 Allergy status to other drugs, medicaments and biological substances status: Secondary | ICD-10-CM

## 2018-05-20 DIAGNOSIS — Z87440 Personal history of urinary (tract) infections: Secondary | ICD-10-CM

## 2018-05-20 DIAGNOSIS — E1169 Type 2 diabetes mellitus with other specified complication: Secondary | ICD-10-CM | POA: Diagnosis present

## 2018-05-20 DIAGNOSIS — Z79899 Other long term (current) drug therapy: Secondary | ICD-10-CM | POA: Diagnosis not present

## 2018-05-20 DIAGNOSIS — T368X5A Adverse effect of other systemic antibiotics, initial encounter: Secondary | ICD-10-CM | POA: Diagnosis not present

## 2018-05-20 DIAGNOSIS — L97516 Non-pressure chronic ulcer of other part of right foot with bone involvement without evidence of necrosis: Secondary | ICD-10-CM | POA: Diagnosis not present

## 2018-05-20 DIAGNOSIS — E119 Type 2 diabetes mellitus without complications: Secondary | ICD-10-CM

## 2018-05-20 DIAGNOSIS — L270 Generalized skin eruption due to drugs and medicaments taken internally: Secondary | ICD-10-CM | POA: Diagnosis not present

## 2018-05-20 DIAGNOSIS — E871 Hypo-osmolality and hyponatremia: Secondary | ICD-10-CM | POA: Diagnosis present

## 2018-05-20 DIAGNOSIS — Z9851 Tubal ligation status: Secondary | ICD-10-CM

## 2018-05-20 DIAGNOSIS — Z833 Family history of diabetes mellitus: Secondary | ICD-10-CM | POA: Diagnosis not present

## 2018-05-20 DIAGNOSIS — I4891 Unspecified atrial fibrillation: Secondary | ICD-10-CM | POA: Diagnosis present

## 2018-05-20 DIAGNOSIS — L03115 Cellulitis of right lower limb: Secondary | ICD-10-CM | POA: Diagnosis present

## 2018-05-20 DIAGNOSIS — E861 Hypovolemia: Secondary | ICD-10-CM | POA: Diagnosis present

## 2018-05-20 DIAGNOSIS — N3000 Acute cystitis without hematuria: Secondary | ICD-10-CM | POA: Diagnosis not present

## 2018-05-20 DIAGNOSIS — Z09 Encounter for follow-up examination after completed treatment for conditions other than malignant neoplasm: Secondary | ICD-10-CM

## 2018-05-20 DIAGNOSIS — L97909 Non-pressure chronic ulcer of unspecified part of unspecified lower leg with unspecified severity: Secondary | ICD-10-CM | POA: Diagnosis not present

## 2018-05-20 DIAGNOSIS — E11319 Type 2 diabetes mellitus with unspecified diabetic retinopathy without macular edema: Secondary | ICD-10-CM | POA: Diagnosis present

## 2018-05-20 DIAGNOSIS — M86171 Other acute osteomyelitis, right ankle and foot: Secondary | ICD-10-CM | POA: Diagnosis present

## 2018-05-20 DIAGNOSIS — E11622 Type 2 diabetes mellitus with other skin ulcer: Secondary | ICD-10-CM | POA: Diagnosis not present

## 2018-05-20 DIAGNOSIS — L97509 Non-pressure chronic ulcer of other part of unspecified foot with unspecified severity: Secondary | ICD-10-CM

## 2018-05-20 HISTORY — DX: Type 2 diabetes mellitus with unspecified diabetic retinopathy without macular edema: E11.319

## 2018-05-20 HISTORY — DX: Type 2 diabetes mellitus with foot ulcer: E11.621

## 2018-05-20 HISTORY — DX: Arthropathic psoriasis, unspecified: L40.50

## 2018-05-20 HISTORY — DX: Supraventricular tachycardia, unspecified: I47.10

## 2018-05-20 HISTORY — DX: Polycystic ovarian syndrome: E28.2

## 2018-05-20 HISTORY — DX: Supraventricular tachycardia: I47.1

## 2018-05-20 HISTORY — DX: Psoriasis, unspecified: L40.9

## 2018-05-20 HISTORY — DX: Type 2 diabetes mellitus without complications: E11.9

## 2018-05-20 HISTORY — DX: Non-pressure chronic ulcer of other part of right foot with unspecified severity: L97.519

## 2018-05-20 LAB — URINALYSIS, ROUTINE W REFLEX MICROSCOPIC
Bacteria, UA: NONE SEEN
Bilirubin Urine: NEGATIVE
Ketones, ur: NEGATIVE mg/dL
NITRITE: NEGATIVE
Protein, ur: NEGATIVE mg/dL
SPECIFIC GRAVITY, URINE: 1.03 (ref 1.005–1.030)
pH: 5 (ref 5.0–8.0)

## 2018-05-20 LAB — CBC WITH DIFFERENTIAL/PLATELET
Abs Immature Granulocytes: 0.1 10*3/uL (ref 0.0–0.1)
Basophils Absolute: 0.1 10*3/uL (ref 0.0–0.1)
Basophils Relative: 0 %
Eosinophils Absolute: 0.2 10*3/uL (ref 0.0–0.7)
Eosinophils Relative: 1 %
HEMATOCRIT: 47.7 % — AB (ref 36.0–46.0)
Hemoglobin: 15.5 g/dL — ABNORMAL HIGH (ref 12.0–15.0)
Immature Granulocytes: 1 %
LYMPHS ABS: 1.2 10*3/uL (ref 0.7–4.0)
Lymphocytes Relative: 7 %
MCH: 30.1 pg (ref 26.0–34.0)
MCHC: 32.5 g/dL (ref 30.0–36.0)
MCV: 92.6 fL (ref 78.0–100.0)
MONO ABS: 1 10*3/uL (ref 0.1–1.0)
MONOS PCT: 6 %
Neutro Abs: 14.1 10*3/uL — ABNORMAL HIGH (ref 1.7–7.7)
Neutrophils Relative %: 85 %
Platelets: 409 10*3/uL — ABNORMAL HIGH (ref 150–400)
RBC: 5.15 MIL/uL — ABNORMAL HIGH (ref 3.87–5.11)
RDW: 14.6 % (ref 11.5–15.5)
WBC: 16.6 10*3/uL — ABNORMAL HIGH (ref 4.0–10.5)

## 2018-05-20 LAB — COMPREHENSIVE METABOLIC PANEL
ALBUMIN: 3.4 g/dL — AB (ref 3.5–5.0)
ALK PHOS: 99 U/L (ref 38–126)
ALT: 15 U/L (ref 0–44)
AST: 15 U/L (ref 15–41)
Anion gap: 11 (ref 5–15)
BUN: 7 mg/dL (ref 6–20)
CO2: 27 mmol/L (ref 22–32)
CREATININE: 0.62 mg/dL (ref 0.44–1.00)
Calcium: 9.4 mg/dL (ref 8.9–10.3)
Chloride: 98 mmol/L (ref 98–111)
GFR calc Af Amer: >60 mL/min (ref >60)
GFR calc non Af Amer: >60 mL/min (ref >60)
GLUCOSE: 146 mg/dL — AB (ref 70–99)
Potassium: 4 mmol/L (ref 3.5–5.1)
SODIUM: 136 mmol/L (ref 135–145)
Total Bilirubin: 0.9 mg/dL (ref 0.3–1.2)
Total Protein: 8 g/dL (ref 6.5–8.1)

## 2018-05-20 LAB — I-STAT CG4 LACTIC ACID, ED
Lactic Acid, Venous: 1.61 mmol/L (ref 0.5–1.9)
Lactic Acid, Venous: 1.78 mmol/L (ref 0.5–1.9)

## 2018-05-20 LAB — GLUCOSE, CAPILLARY: Glucose-Capillary: 147 mg/dL — ABNORMAL HIGH (ref 70–99)

## 2018-05-20 LAB — CBG MONITORING, ED: Glucose-Capillary: 124 mg/dL — ABNORMAL HIGH (ref 70–99)

## 2018-05-20 LAB — C-REACTIVE PROTEIN: CRP: 17.4 mg/dL — ABNORMAL HIGH (ref <1.0)

## 2018-05-20 LAB — SEDIMENTATION RATE: Sed Rate: 34 mm/hr — ABNORMAL HIGH (ref 0–22)

## 2018-05-20 MED ORDER — VANCOMYCIN HCL IN DEXTROSE 1-5 GM/200ML-% IV SOLN
1000.0000 mg | Freq: Two times a day (BID) | INTRAVENOUS | Status: DC
  Administered 2018-05-21: 1000 mg via INTRAVENOUS
  Filled 2018-05-20 (×2): qty 200

## 2018-05-20 MED ORDER — LACTATED RINGERS IV BOLUS
1000.0000 mL | Freq: Once | INTRAVENOUS | Status: AC
  Administered 2018-05-20: 1000 mL via INTRAVENOUS

## 2018-05-20 MED ORDER — ACETAMINOPHEN 500 MG PO TABS
1000.0000 mg | ORAL_TABLET | Freq: Once | ORAL | Status: AC
  Administered 2018-05-20: 1000 mg via ORAL
  Filled 2018-05-20: qty 2

## 2018-05-20 MED ORDER — METRONIDAZOLE IN NACL 5-0.79 MG/ML-% IV SOLN
500.0000 mg | Freq: Three times a day (TID) | INTRAVENOUS | Status: DC
  Administered 2018-05-20 – 2018-05-24 (×12): 500 mg via INTRAVENOUS
  Filled 2018-05-20 (×14): qty 100

## 2018-05-20 MED ORDER — ONDANSETRON HCL 4 MG/2ML IJ SOLN
4.0000 mg | Freq: Four times a day (QID) | INTRAMUSCULAR | Status: DC | PRN
  Administered 2018-05-23: 4 mg via INTRAVENOUS
  Filled 2018-05-20: qty 2

## 2018-05-20 MED ORDER — LACTATED RINGERS IV BOLUS (SEPSIS): 1000.0000 mL | Freq: Once | INTRAVENOUS | Status: DC

## 2018-05-20 MED ORDER — INSULIN ASPART 100 UNIT/ML ~~LOC~~ SOLN
0.0000 [IU] | Freq: Three times a day (TID) | SUBCUTANEOUS | Status: DC
  Administered 2018-05-21: 3 [IU] via SUBCUTANEOUS

## 2018-05-20 MED ORDER — LACTATED RINGERS IV BOLUS (SEPSIS)
1000.0000 mL | Freq: Once | INTRAVENOUS | Status: AC
  Administered 2018-05-20: 1000 mL via INTRAVENOUS

## 2018-05-20 MED ORDER — SENNOSIDES-DOCUSATE SODIUM 8.6-50 MG PO TABS: 1.0000 | ORAL_TABLET | Freq: Every evening | ORAL | Status: DC | PRN

## 2018-05-20 MED ORDER — INSULIN GLARGINE 100 UNIT/ML ~~LOC~~ SOLN: 60.0000 [IU] | Freq: Every day | SUBCUTANEOUS | Status: DC

## 2018-05-20 MED ORDER — ONDANSETRON HCL 4 MG PO TABS
4.0000 mg | ORAL_TABLET | Freq: Four times a day (QID) | ORAL | Status: DC | PRN
  Administered 2018-05-22: 4 mg via ORAL
  Filled 2018-05-20: qty 1

## 2018-05-20 MED ORDER — SODIUM CHLORIDE 0.9% FLUSH: 3.0000 mL | INTRAVENOUS | Status: DC | PRN

## 2018-05-20 MED ORDER — ALPRAZOLAM 0.5 MG PO TABS: 1.0000 mg | ORAL_TABLET | Freq: Every evening | ORAL | Status: DC | PRN

## 2018-05-20 MED ORDER — VANCOMYCIN HCL 10 G IV SOLR
2000.0000 mg | Freq: Once | INTRAVENOUS | Status: AC
  Administered 2018-05-20: 2000 mg via INTRAVENOUS
  Filled 2018-05-20: qty 2000

## 2018-05-20 MED ORDER — SODIUM CHLORIDE 0.9% FLUSH
3.0000 mL | Freq: Two times a day (BID) | INTRAVENOUS | Status: DC
  Administered 2018-05-20 – 2018-05-23 (×3): 3 mL via INTRAVENOUS

## 2018-05-20 MED ORDER — ACETAMINOPHEN 325 MG PO TABS
650.0000 mg | ORAL_TABLET | Freq: Four times a day (QID) | ORAL | Status: DC | PRN
  Administered 2018-05-23: 650 mg via ORAL
  Filled 2018-05-20: qty 2

## 2018-05-20 MED ORDER — OXYCODONE-ACETAMINOPHEN 5-325 MG PO TABS: 1.0000 | ORAL_TABLET | ORAL | Status: DC | PRN

## 2018-05-20 MED ORDER — INSULIN ASPART 100 UNIT/ML ~~LOC~~ SOLN: 0.0000 [IU] | SUBCUTANEOUS | Status: DC

## 2018-05-20 MED ORDER — INSULIN ASPART 100 UNIT/ML ~~LOC~~ SOLN: 0.0000 [IU] | Freq: Every day | SUBCUTANEOUS | Status: DC

## 2018-05-20 MED ORDER — SODIUM CHLORIDE 0.9 % IV SOLN
250.0000 mL | INTRAVENOUS | Status: DC | PRN
  Administered 2018-05-22: 20 mL via INTRAVENOUS
  Administered 2018-05-24: 250 mL via INTRAVENOUS

## 2018-05-20 MED ORDER — ACETAMINOPHEN 650 MG RE SUPP: 650.0000 mg | Freq: Four times a day (QID) | RECTAL | Status: DC | PRN

## 2018-05-20 MED ORDER — PIPERACILLIN-TAZOBACTAM 3.375 G IVPB 30 MIN
3.3750 g | Freq: Once | INTRAVENOUS | Status: AC
  Administered 2018-05-20: 3.375 g via INTRAVENOUS
  Filled 2018-05-20: qty 50

## 2018-05-20 MED ORDER — LACTATED RINGERS IV BOLUS (SEPSIS)
500.0000 mL | Freq: Once | INTRAVENOUS | Status: AC
  Administered 2018-05-20: 500 mL via INTRAVENOUS

## 2018-05-20 MED ORDER — ALBUTEROL SULFATE (2.5 MG/3ML) 0.083% IN NEBU: 3.0000 mL | INHALATION_SOLUTION | Freq: Four times a day (QID) | RESPIRATORY_TRACT | Status: DC | PRN

## 2018-05-20 MED ORDER — OXYCODONE-ACETAMINOPHEN 5-325 MG PO TABS
1.0000 | ORAL_TABLET | ORAL | Status: DC | PRN
  Administered 2018-05-20 – 2018-05-21 (×3): 2 via ORAL
  Administered 2018-05-22: 1 via ORAL
  Administered 2018-05-22 – 2018-05-23 (×2): 2 via ORAL
  Filled 2018-05-20: qty 1
  Filled 2018-05-20 (×5): qty 2

## 2018-05-20 MED ORDER — SODIUM CHLORIDE 0.9 % IV SOLN
2.0000 g | Freq: Two times a day (BID) | INTRAVENOUS | Status: DC
  Administered 2018-05-20 – 2018-05-24 (×8): 2 g via INTRAVENOUS
  Filled 2018-05-20 (×9): qty 2

## 2018-05-20 MED ORDER — INSULIN GLARGINE 100 UNIT/ML ~~LOC~~ SOLN
90.0000 [IU] | Freq: Every day | SUBCUTANEOUS | Status: DC
  Administered 2018-05-21 – 2018-05-23 (×3): 90 [IU] via SUBCUTANEOUS
  Filled 2018-05-20 (×5): qty 0.9

## 2018-05-20 MED ORDER — VERAPAMIL HCL ER 240 MG PO TBCR
240.0000 mg | EXTENDED_RELEASE_TABLET | Freq: Every day | ORAL | Status: DC
  Administered 2018-05-21 – 2018-05-24 (×4): 240 mg via ORAL
  Filled 2018-05-20 (×4): qty 1

## 2018-05-20 NOTE — ED Provider Notes (Signed)
[place] EMERGENCY DEPARTMENT Provider Note   CSN: [id number] Arrival date & time: 05/20/18  1446     History   Chief Complaint Chief Complaint  Patient presents with  . Foot Infection    HPI [name] is a 47 y.o. female.  The history is provided by the patient.  Rash   This is a new problem. The current episode started more than 2 days ago. The problem has been gradually worsening. Associated with: diabetic foot wound (1 year) The maximum temperature recorded prior to her arrival was 100 to 100.9 F. The fever has been present for 1 to 2 days. The rash is present on the right lower leg, right ankle, right foot and right toes. The pain is severe. The pain has been constant since onset. Associated symptoms include pain. Treatments tried: augmentin. The treatment provided no (worsening of rash) relief.    Past Medical History:  Diagnosis Date  . Atrial fibrillation ([place])   . Diabetic retinopathy ([place])   . Diabetic ulcer of right great toe [facility])    "been dealing w/it for ~ 1 yr" (05/20/2018)  . Polycystic ovarian syndrome   . Psoriasis   . Psoriatic arthritis ([place])    "pretty much all over; knees, hands, elbows" (05/20/2018)  . SVT (supraventricular tachycardia) ([place])    "had 1 chemical conversion here in the ER" (05/20/2018)  . Type II diabetes mellitus [facility])     Patient Active Problem List   Diagnosis Date Noted  . Sepsis due to cellulitis ([place]) 05/20/2018  . UTI (urinary tract infection) 05/20/2018  . Diabetic foot ulcer ([place]) 06/22/2017  . Fracture of base of fifth metatarsal bone with routine healing 06/22/2017  . SVT (supraventricular tachycardia) ([place]) 05/14/2014  . Pyelonephritis, acute 10/09/2011  . Insulin-requiring or dependent type II diabetes mellitus ([place]) 12/02/2009  . Polycystic ovaries 12/02/2009  . MIGRAINE HEADACHE 12/02/2009  . IBS 12/02/2009  . PSORIASIS 12/02/2009  . MICROCYTOSIS 12/02/2009    Past Surgical History:    Procedure Laterality Date  . CESAREAN SECTION  2005  . FOOT FRACTURE SURGERY Left    pin put in by Dr. Jacqualyn Posey  . FRACTURE SURGERY    . TUBAL LIGATION  2005     OB History   None      Home Medications    Prior to Admission medications   Medication Sig Start Date End Date Taking? Authorizing Provider  acetaminophen (TYLENOL) 500 MG tablet Take 1,000 mg by mouth every 6 (six) hours as needed for mild pain or headache.   Yes [provider]  amoxicillin-clavulanate (AUGMENTIN) 875-125 MG tablet Take 1 tablet by mouth 2 (two) times daily. 05/17/18  Yes Trula Slade, DPM  Cholecalciferol (VITAMIN D3) 5000 units CAPS Take 5,000 Units by mouth daily.   Yes [provider]  Dapagliflozin-metFORMIN HCl ER (XIGDUO XR) 01-999 MG TB24 Take 2 tablets by mouth daily.   Yes [provider]  FEROSUL 325 (65 Fe) MG tablet Take 650 mg by mouth daily with breakfast.  12/04/17  Yes [provider]  hydroxypropyl methylcellulose / hypromellose (ISOPTO TEARS / GONIOVISC) 2.5 % ophthalmic solution Place 1 drop into both eyes 3 (three) times daily as needed for dry eyes.   Yes [provider]  Insulin Degludec (TRESIBA FLEXTOUCH) 200 UNIT/ML SOPN Inject 180 Units into the skin at bedtime.    Yes [provider]  norethindrone (MICRONOR,CAMILA,ERRIN) 0.35 MG tablet Take 1 tablet by mouth daily.  02/13/18  Yes [provider]  silver sulfADIAZINE (SILVADENE) 1 % cream Apply 1 application topically daily. Patient taking differently: Apply 1 application topically daily as needed (for irritation).  09/07/17  Yes Trula Slade, DPM  verapamil (CALAN-SR) 240 MG CR tablet TAKE 1 TABLET BY MOUTH AT BEDTIME Patient taking differently: Take 240 mg by mouth daily.  11/25/15  Yes Jettie Booze, MD    Family History Family History  Problem Relation Age of Onset  . Hypertension Mother   . Diabetes Mother   . Asthma Mother   . CAD Father    . Diabetes Sister   . Diabetes Brother   . Hypertension Brother     Social History Social History   Tobacco Use  . Smoking status: Never Smoker  . Smokeless tobacco: Never Used  Substance Use Topics  . Alcohol use: Not Currently  . Drug use: Never     Allergies   Trulicity [dulaglutide]; Ciprofloxacin; Keflex [cephalexin]; and Latex   Review of Systems Review of Systems  Constitutional: Positive for chills and fever.  HENT: Negative for ear pain and sore throat.   Eyes: Negative for pain and visual disturbance.  Respiratory: Negative for cough and shortness of breath.   Cardiovascular: Negative for chest pain and palpitations.  Gastrointestinal: Negative for abdominal pain and vomiting.  Genitourinary: Negative for dysuria and hematuria.  Musculoskeletal: Positive for arthralgias (right lower leg, foot, and toe). Negative for back pain.  Skin: Positive for rash and wound. Negative for color change.  Neurological: Negative for seizures and syncope.  All other systems reviewed and are negative.    Physical Exam Updated Vital Signs BP 121/70 (BP Location: Right Arm)   Pulse 93   Temp 99.3 F (37.4 C) (Oral)   Resp 19   Ht _0  (1.702 m)   Wt 108.9 kg   LMP 05/13/2018 Comment: tubal ligation  SpO2 95%   BMI 37.59 kg/m   Physical Exam  Constitutional: She appears well-developed and well-nourished. No distress.  HENT:  Head: Normocephalic and atraumatic.  Eyes: Conjunctivae are normal.  Neck: Neck supple.  Cardiovascular: Normal rate and regular rhythm.  No murmur heard. Pulmonary/Chest: Effort normal and breath sounds normal. No respiratory distress.  Abdominal: Soft. There is no tenderness.  Musculoskeletal: She exhibits no edema.  Erythema of right great toe that streaks up the patient's leg to her knee.  Swelling and tenderness of the right great toe.  Neurological: She is alert.  Skin: Skin is warm and dry.  Psychiatric: She has a normal mood and  affect.  Nursing note and vitals reviewed.    ED Treatments / Results  Labs (all labs ordered are listed, but only abnormal results are displayed) Labs Reviewed  COMPREHENSIVE METABOLIC PANEL - Abnormal; Notable for the following components:      Result Value   Glucose, Bld 146 (*)    Albumin 3.4 (*)    All other components within normal limits  CBC WITH DIFFERENTIAL/PLATELET - Abnormal; Notable for the following components:   WBC 16.6 (*)    RBC 5.15 (*)    Hemoglobin 15.5 (*)    HCT 47.7 (*)    Platelets 409 (*)    Neutro Abs 14.1 (*)    All other components within normal limits  SEDIMENTATION RATE - Abnormal; Notable for the following components:   Sed Rate 34 (*)    All other components within normal limits  C-REACTIVE PROTEIN - Abnormal; Notable for the following  components:   CRP 17.4 (*)    All other components within normal limits  URINALYSIS, ROUTINE W REFLEX MICROSCOPIC - Abnormal; Notable for the following components:   APPearance HAZY (*)    Glucose, UA >=500 (*)    Hgb urine dipstick MODERATE (*)    Leukocytes, UA SMALL (*)    All other components within normal limits  GLUCOSE, CAPILLARY - Abnormal; Notable for the following components:   Glucose-Capillary 147 (*)    All other components within normal limits  CBG MONITORING, ED - Abnormal; Notable for the following components:   Glucose-Capillary 124 (*)    All other components within normal limits  CULTURE, BLOOD (ROUTINE X 2)  CULTURE, BLOOD (ROUTINE X 2)  URINE CULTURE  HIV ANTIBODY (ROUTINE TESTING)  HEMOGLOBIN A1C  SEDIMENTATION RATE  C-REACTIVE PROTEIN  PREALBUMIN  BASIC METABOLIC PANEL  CBC WITH DIFFERENTIAL/PLATELET  I-STAT CG4 LACTIC ACID, ED  I-STAT CG4 LACTIC ACID, ED    EKG None  Radiology Dg Toe Great Right  Result Date: 05/20/2018 CLINICAL DATA:  Worsening diabetic foot ulcer. EXAM: RIGHT GREAT TOE COMPARISON:  None. FINDINGS: There soft tissue swelling of the right great toe with  plantar soft tissue ulceration noted. Speckled soft tissue emphysema is seen along the plantar aspect of the great toe at the level of the tuft. Subtle osteopenic appearance of the tuft is noted more so along the lateral aspect and the possibility of osteomyelitis is not excluded. No joint dislocation or acute fracture. Mild soft tissue swelling over dorsum of the forefoot is also present. IMPRESSION: 1. Soft tissue swelling of the great toe with plantar soft tissue ulceration. 2. Soft tissue emphysema is identified of the great toe adjacent to the tuft. 3. Subtle osteopenic appearance involving the lateral aspect of the tuft of the great toe is identified. Findings raise suspicion for subtle changes of osteomyelitis. Electronically Signed   By: Ashley Royalty M.D.   On: 05/20/2018 18:49    Procedures Procedures (including critical care time)  Medications Ordered in ED Medications  vancomycin (VANCOCIN) IVPB 1000 mg/200 mL premix (has no administration in time range)  verapamil (CALAN-SR) CR tablet 240 mg (has no administration in time range)  ALPRAZolam (XANAX) tablet 1 mg (has no administration in time range)  albuterol (PROVENTIL) (2.5 MG/3ML) 0.083% nebulizer solution 3 mL (has no administration in time range)  sodium chloride flush (NS) 0.9 % injection 3 mL (3 mLs Intravenous Given 05/20/18 2200)  sodium chloride flush (NS) 0.9 % injection 3 mL (has no administration in time range)  0.9 %  sodium chloride infusion (has no administration in time range)  acetaminophen (TYLENOL) tablet 650 mg (has no administration in time range)    Or  acetaminophen (TYLENOL) suppository 650 mg (has no administration in time range)  senna-docusate (Senokot-S) tablet 1 tablet (has no administration in time range)  ondansetron (ZOFRAN) tablet 4 mg (has no administration in time range)    Or  ondansetron (ZOFRAN) injection 4 mg (has no administration in time range)  metroNIDAZOLE (FLAGYL) IVPB 500 mg ( Intravenous  Rate/Dose Verify 05/20/18 2350)  oxyCODONE-acetaminophen (PERCOCET/ROXICET) 5-325 MG per tablet 1-2 tablet (2 tablets Oral Given 05/20/18 2059)  ceFEPIme (MAXIPIME) 2 g in sodium chloride 0.9 % 100 mL IVPB ( Intravenous Stopped 05/20/18 2343)  insulin glargine (LANTUS) injection 90 Units (has no administration in time range)  insulin aspart (novoLOG) injection 0-15 Units (has no administration in time range)  insulin aspart (novoLOG) injection 0-5 Units (has  no administration in time range)  piperacillin-tazobactam (ZOSYN) IVPB 3.375 g (0 g Intravenous Stopped 05/20/18 1844)  lactated ringers bolus 1,000 mL (0 mLs Intravenous Stopped 05/20/18 2001)    And  lactated ringers bolus 1,000 mL (0 mLs Intravenous Stopped 05/20/18 1947)    And  lactated ringers bolus 500 mL (500 mLs Intravenous New Bag/Given 05/20/18 2250)  acetaminophen (TYLENOL) tablet 1,000 mg (1,000 mg Oral Given 05/20/18 1949)  lactated ringers bolus 1,000 mL (1,000 mLs Intravenous Transfusing/Transfer 05/20/18 2132)  vancomycin (VANCOCIN) 2,000 mg in sodium chloride 0.9 % 500 mL IVPB ( Intravenous Stopped 05/20/18 2313)     Initial Impression / Assessment and Plan / ED Course  I have reviewed the triage vital signs and the nursing notes.  Pertinent labs & imaging results that were available during my care of the patient were reviewed by me and considered in my medical decision making (see chart for details).     The patient is a 47 year old lady with past medical history of diabetes who presents with cellulitis of her right great toe.  The patient reports several days infection that is currently being treated with amoxicillin.  She reports that the redness and pain are worsening and she began having fevers and chills.  CBC reveals leukocytosis and no anemia.  CMP reveals no significant abnormalities.  Lactic acid within normal limits.  ESR and CRP are elevated and x-ray of toe shows some signs concerning for osteomyelitis.  Patient started on  vancomycin and Zosyn.  Patient had code sepsis triggered and received 30 cc per kg of normal saline.  She admitted to internal medicine for sepsis secondary to cellulitis, and possibly osteomyelitis.  Patient care supervised by Dr. Ellender Hose.  Irven Baltimore, MD   Final Clinical Impressions(s) / ED Diagnoses   Final diagnoses:  Cellulitis of toe of left foot    ED Discharge Orders    None       Irven Baltimore, MD 05/21/18 0100    Duffy Bruce, MD 05/23/18 [phone]

## 2018-05-20 NOTE — ED Notes (Signed)
Phlebotomy drew blood cultures and labs prior to antibiotic start but still hadn't clicked off in computer by the time antibiotics started

## 2018-05-20 NOTE — H&P (Signed)
History and Physical    [name] MAU:[id number] DOB: 17-Dec-1970 DOA: 05/20/2018  PCP: Lavone Orn, MD   Patient coming from: Home   Chief Complaint: Right foot infection, fever  HPI: [name] is a 47 y.o. female with medical history significant for insulin-dependent diabetes mellitus, paroxysmal SVT, and anxiety, now presenting to the emergency department for evaluation of fevers, chills, and increasing redness and swelling of the right foot.  Patient has had a diabetic ulcer at the plantar aspect of her right great toe followed by wound care for a year, recently developed redness and swelling of the first toe, and reports that this has continued to worsen despite being on Augmentin for the past 4 days.  Patient reports that erythema and edema have extended from the right first toe, to involve the foot and distal leg.  She had been experiencing suprapubic tenderness at time of an ED visit on 05/17/2018, but this has resolved with the Augmentin.  She has not noted any drainage from the toe ulcer.  She denies shortness of breath, cough, or chest pain.  She also has a chronic ulcer at the plantar aspect of the left great toe without any erythema, edema, or drainage.  ED Course: Upon arrival to the ED, patient is found to be febrile to 38.1 C and tachycardic to the 120s.  EKG features a sinus tachycardia with rate 126.  Radiographs of the right foot demonstrate soft tissue swelling of the great toe with plantar ulceration, soft tissue emphysema, and concern for underlying osteomyelitis.  Chemistry panel is unremarkable and CBC is notable for a leukocytosis to 16,600 and a thrombocytosis with platelets 409,000.  Lactic acid is reassuringly normal.  CRP is elevated to 17.4.  Blood cultures were collected, 30 cc/kg fluid bolus was given, and the patient was started on empiric vancomycin and Zosyn.  Blood pressure remained stable, tachycardia has improved, and patient will be admitted for ongoing  evaluation and management of sepsis secondary to infected diabetic foot ulcer with concern for underlying osteomyelitis.  Review of Systems:  All other systems reviewed and apart from HPI, are negative.  Past Medical History:  Diagnosis Date  . Atrial fibrillation ([place])   . Diabetes mellitus   . Polycystic ovarian syndrome   . Psoriasis   . Psoriatic arthritis [facility])     Past Surgical History:  Procedure Laterality Date  . CESAREAN SECTION  2005  . FOOT SURGERY     pin put in by Dr. Jacqualyn Posey  . TUBAL LIGATION       reports that she has never smoked. She has never used smokeless tobacco. She reports that she does not drink alcohol or use drugs.  Allergies  Allergen Reactions  . Ciprofloxacin Rash  . Keflex [Cephalexin] Rash  . Latex Rash    Family History  Problem Relation Age of Onset  . Hypertension Mother   . Diabetes Mother   . Asthma Mother   . CAD Father   . Diabetes Sister   . Diabetes Brother   . Hypertension Brother      Prior to Admission medications   Medication Sig Start Date End Date Taking? Authorizing Provider  ALPRAZolam Duanne Moron) 1 MG tablet Take 1 mg by mouth at bedtime as needed for anxiety.    [provider]  amoxicillin-clavulanate (AUGMENTIN) 875-125 MG tablet Take 1 tablet by mouth 2 (two) times daily. 05/17/18   Trula Slade, DPM  Cholecalciferol (VITAMIN D PO) Take 5,000 Units by  mouth 2 (two) times daily.    [provider]  Dapagliflozin-Metformin HCl ER (XIGDUO XR) 06-999 MG TB24 Take 2 tablets by mouth daily.    [provider]  Dulaglutide (TRULICITY) 0.30 SP/2.3RA SOPN Inject into the skin. Pt takes once a week    [provider]  FEROSUL 325 (65 Fe) MG tablet TK 1 T PO BID 12/04/17   [provider]  ibuprofen (ADVIL,MOTRIN) 200 MG tablet Take 800 mg by mouth every 6 (six) hours as needed for moderate pain.    [provider]  Insulin Degludec (TRESIBA FLEXTOUCH) 200 UNIT/ML SOPN  Inject into the skin. Pt takes 120 units every night    [provider]  metFORMIN (GLUCOPHAGE) 1000 MG tablet Take 1,000 mg by mouth. 12/17/16   [provider]  norethindrone (MICRONOR,CAMILA,ERRIN) 0.35 MG tablet TK 1 T PO D 02/13/18   [provider]  oxyCODONE-acetaminophen (PERCOCET/ROXICET) 5-325 MG tablet Take 1 tablet by mouth every 4 (four) hours as needed for severe pain (Take 1 tablet by mouth every).    [provider]  PROAIR HFA 108 (90 Base) MCG/ACT inhaler INL 2 PFS PO Q 6 H PRN 12/28/17   [provider]  promethazine (PHENERGAN) 25 MG tablet Take 25 mg by mouth 3 (three) times daily. Take 1 tablet by mouth three times daily for seven days. 02/28/17   Trula Slade, DPM  silver sulfADIAZINE (SILVADENE) 1 % cream Apply 1 application topically daily. 09/07/17   Trula Slade, DPM  tobramycin (TOBREX) 0.3 % ophthalmic solution  01/22/18   [provider]  verapamil (CALAN-SR) 240 MG CR tablet TAKE 1 TABLET BY MOUTH AT BEDTIME 11/25/15   Jettie Booze, MD    Physical Exam: Vitals:   05/20/18 1830 05/20/18 1900 05/20/18 1917 05/20/18 1930  BP: 118/64 124/77  125/75  Pulse:  (!) 107  (!) 110  Resp:  (!) 24  (!) 25  Temp:      TempSrc:      SpO2:  99%  96%  Weight:   108.9 kg   Height:   5' 7" (1.702 m)       Constitutional: NAD, calm  Eyes: PERTLA, lids and conjunctivae normal ENMT: Mucous membranes are moist. Posterior pharynx clear of any exudate or lesions.   Neck: normal, supple, no masses, no thyromegaly Respiratory: clear to auscultation bilaterally, no wheezing, no crackles. Normal respiratory effort.    Cardiovascular: Rate ~110 and regular. No extremity edema.   Abdomen: No distension, no tenderness, soft. Bowel sounds normal.  Musculoskeletal: no clubbing / cyanosis. No joint deformity upper and lower extremities.    Skin: Ulceration to plantar aspect of 1st toe bilaterally. Erythema, edema, and heat  to 1st toe on right and extending to involve the dorsal foot and distal RLE. Otherwise, warm, dry, well-perfused. Neurologic: CN 2-12 grossly intact. Strength 5/5 in all 4 limbs.  Psychiatric: Alert and oriented x 3. Calm, cooperative.     Labs on Admission: I have personally reviewed following labs and imaging studies  CBC: Recent Labs  Lab 05/17/18 0843 05/20/18 1545  WBC 11.2* 16.6*  NEUTROABS  --  14.1*  HGB 15.1* 15.5*  HCT 47.3* 47.7*  MCV 92.7 92.6  PLT 332 076*   Basic Metabolic Panel: Recent Labs  Lab 05/17/18 0843 05/20/18 1545  NA 136 136  K 4.0 4.0  CL 103 98  CO2 26 27  GLUCOSE 234* 146*  BUN 6 7  CREATININE  0.62 0.62  CALCIUM 9.0 9.4   GFR: Estimated Creatinine Clearance: 110.5 mL/min (by C-G formula based on SCr of 0.62 mg/dL). Liver Function Tests: Recent Labs  Lab 05/17/18 0843 05/20/18 1545  AST 16 15  ALT 16 15  ALKPHOS 81 99  BILITOT 0.8 0.9  PROT 8.1 8.0  ALBUMIN 3.7 3.4*   Recent Labs  Lab 05/17/18 0843  LIPASE 29   No results for input(s): AMMONIA in the last 168 hours. Coagulation Profile: No results for input(s): INR, PROTIME in the last 168 hours. Cardiac Enzymes: No results for input(s): CKTOTAL, CKMB, CKMBINDEX, TROPONINI in the last 168 hours. BNP (last 3 results) No results for input(s): PROBNP in the last 8760 hours. HbA1C: No results for input(s): HGBA1C in the last 72 hours. CBG: Recent Labs  Lab 05/17/18 0855 05/20/18 1655  GLUCAP 228* 124*   Lipid Profile: No results for input(s): CHOL, HDL, LDLCALC, TRIG, CHOLHDL, LDLDIRECT in the last 72 hours. Thyroid Function Tests: No results for input(s): TSH, T4TOTAL, FREET4, T3FREE, THYROIDAB in the last 72 hours. Anemia Panel: No results for input(s): VITAMINB12, FOLATE, FERRITIN, TIBC, IRON, RETICCTPCT in the last 72 hours. Urine analysis:    Component Value Date/Time   COLORURINE STRAW (A) 05/17/2018 0926   APPEARANCEUR CLEAR 05/17/2018 0926   LABSPEC 1.029  05/17/2018 0926   PHURINE 5.0 05/17/2018 0926   GLUCOSEU >=500 (A) 05/17/2018 0926   HGBUR MODERATE (A) 05/17/2018 0926   BILIRUBINUR NEGATIVE 05/17/2018 0926   KETONESUR 5 (A) 05/17/2018 0926   PROTEINUR NEGATIVE 05/17/2018 0926   UROBILINOGEN 0.2 10/09/2011 1807   NITRITE NEGATIVE 05/17/2018 0926   LEUKOCYTESUR SMALL (A) 05/17/2018 0926   Sepsis Labs: _0 (procalcitonin:4,lacticidven:4) ) Recent Results (from the past 240 hour(s))  Urine culture     Status: Abnormal   Collection Time: 05/17/18  9:26 AM  Result Value Ref Range Status   Specimen Description URINE, CATHETERIZED  Final   Special Requests NONE  Final   Culture (A)  Final    >=100,000 COLONIES/mL GROUP B STREP(S.AGALACTIAE)ISOLATED TESTING AGAINST S. AGALACTIAE NOT ROUTINELY PERFORMED DUE TO PREDICTABILITY OF AMP/PEN/VAN SUSCEPTIBILITY. Performed at [facility], [place] [address], [place], [place] [address]    Report Status 05/18/2018 FINAL  Final     Radiological Exams on Admission: Dg Toe Great Right  Result Date: 05/20/2018 CLINICAL DATA:  Worsening diabetic foot ulcer. EXAM: RIGHT GREAT TOE COMPARISON:  None. FINDINGS: There soft tissue swelling of the right great toe with plantar soft tissue ulceration noted. Speckled soft tissue emphysema is seen along the plantar aspect of the great toe at the level of the tuft. Subtle osteopenic appearance of the tuft is noted more so along the lateral aspect and the possibility of osteomyelitis is not excluded. No joint dislocation or acute fracture. Mild soft tissue swelling over dorsum of the forefoot is also present. IMPRESSION: 1. Soft tissue swelling of the great toe with plantar soft tissue ulceration. 2. Soft tissue emphysema is identified of the great toe adjacent to the tuft. 3. Subtle osteopenic appearance involving the lateral aspect of the tuft of the great toe is identified. Findings raise suspicion for subtle changes of osteomyelitis. Electronically Signed    By: Ashley Royalty M.D.   On: 05/20/2018 18:49    EKG: Independently reviewed. Sinus tachycardia (rate 126).   Assessment/Plan   1. Sepsis due to infected diabetic foot ulcer; osteomyelitis  - Presents with fever/chills and increasing erythema and edema around a right great toe ulcer  despite being on Augmentin since 05/17/18  - She is found to be febrile, tachycardic, with WBC 16,600, elevated CRP/ESR, and radiographs concerning for osteomyelitis  - Blood cultures collected in ED, 30 cc/kg bolus given, and she was treated with vancomycin and Zosyn  - Check ABI's, trend inflammatory markers, follow cultures and clinical course, continue empiric antibiotics with vancomycin, cefepime, and Flagyl for now    2. Insulin-dependent DM  - No recent A1c, was 11.3% several yrs ago  - At home, she is taking metformin, Trulicity, and Toujeo 338 units qHS  - Check CBG's and start with Lantus 90 units qHS and Novolog correctional, adjust as needed   3. UTI  - She was seen in ED on 8/30 with suprapubic tenderness, fever/chills, and found to have UTI  - Culture grew GBS  - She has taken 4 days of Augmentin and sxs resolved  - Continued on antibiotics for foot as above, this will cover GBS     4. Paroxysmal SVT  - In sinus tachycardia on arrival in setting of sepsis, improved with IVF  - Continue verapamil     DVT prophylaxis: SCD's  Code Status: Full  Family Communication: Discussed with patient  Consults called: None  Admission status: Inpatient     Vianne Bulls, MD [facility] Pager [phone]  If 7PM-7AM, please contact night-coverage [email or web address] Password [facility]  05/20/2018, 8:03 PM

## 2018-05-20 NOTE — ED Notes (Signed)
Santiago Glad RN reports pt refusing repeat lactic at this time, MD at bedside would be putting in additional labs

## 2018-05-20 NOTE — ED Triage Notes (Signed)
Patient to ED c/o worsening diabetic foot ulcer - was seen Friday and started on antibiotics. Pt states she noticed redness surrounding R great toe extending up her foot with increased swelling. Also endorses chills since Saturday.

## 2018-05-20 NOTE — Progress Notes (Signed)
Patient arrived and walked to the bed from the stretcher with assistance of myself and transport ED tech. Obtained a sandwich from [employer], oriented patient to the room, left bed in lowest position and call bell within reach.

## 2018-05-20 NOTE — ED Notes (Signed)
Patient transported to X-ray 

## 2018-05-20 NOTE — ED Notes (Signed)
CBG RESULT 124 VS DOCUMENTED RN AWARE

## 2018-05-20 NOTE — ED Provider Notes (Signed)
Patient placed in Quick Look pathway, seen and evaluated   Chief Complaint: Infected right great toe  HPI:   She states that she was sent on to the ED on Friday by her doctor. She's had a diabetic ulcer on the right great toe for the past year. She was given a dose of IV antibiotics and sent home with Augmentin. She has taken 5 doses but feels that the redness and swelling has not been improving. She reports chills and nausea. No fever or vomiting. Her abdominal pain from the other night has gone away.    ROS: +infected toe, chills, nausea  Physical Exam:   Gen: No distress  Neuro: Awake and Alert  Skin: Warm    Focused Exam: Heart: Tachycardic. Regular     Lungs: CTA    Right toe: Eraser sized ulcer over the plantar aspect of right great toe with surrounding erythema. No streaking   Initiation of care has begun. The patient has been counseled on the process, plan, and necessity for staying for the completion/evaluation, and the remainder of the medical screening examination    Recardo Evangelist, PA-C 05/20/18 1532    Gareth Morgan, MD 05/22/18 1408

## 2018-05-20 NOTE — Progress Notes (Addendum)
Pharmacy Antibiotic Note  [name] is a 47 y.o. female admitted on 05/20/2018 with cellulitis.  Pharmacy has been consulted for vancomycin dosing. Pt has hx of DFIs, presents with fevers and elevated CRP.  Plan: Vancomycin 2000mg  IV x1 then 1000mg  IV q12h Zosyn x1 per EDP F/U LOT, cultures, renal function Vancomycin level as indicated  ADDENDUM: Cefepime added per Rx. Pt has rash noted with Keflex but appears to have tolerated Rocephin previously. Plan: Cefepime 2g IV q12h  Height: 5\' 7"  (170.2 cm) Weight: 240 lb (108.9 kg) IBW/kg (Calculated) : 61.6  Temp (24hrs), Avg:100 F (37.8 C), Min:99.3 F (37.4 C), Max:100.5 F (38.1 C)  Recent Labs  Lab 05/17/18 0843 05/17/18 0851 05/17/18 1301 05/20/18 1545 05/20/18 1556 05/20/18 1838  WBC 11.2*  --   --  16.6*  --   --   CREATININE 0.62  --   --  0.62  --   --   LATICACIDVEN  --  1.10 0.70  --  1.61 1.78    Estimated Creatinine Clearance: 110.5 mL/min (by C-G formula based on SCr of 0.62 mg/dL).    Allergies  Allergen Reactions  . Ciprofloxacin Rash  . Keflex [Cephalexin] Rash  . Latex Rash    Antimicrobials this admission: Zosyn x1 Vancomycin 9/2 >>   Dose adjustments this admission: none  Microbiology results: sent Thank you for allowing pharmacy to be a part of this patient's care.  Arrie Senate, PharmD, BCPS Clinical Pharmacist [phone] Please check AMION for all [place] numbers 05/20/2018

## 2018-05-21 ENCOUNTER — Inpatient Hospital Stay (HOSPITAL_COMMUNITY): Payer: No Typology Code available for payment source

## 2018-05-21 ENCOUNTER — Telehealth: Payer: Self-pay | Admitting: Podiatry

## 2018-05-21 DIAGNOSIS — M86671 Other chronic osteomyelitis, right ankle and foot: Secondary | ICD-10-CM

## 2018-05-21 DIAGNOSIS — E871 Hypo-osmolality and hyponatremia: Secondary | ICD-10-CM

## 2018-05-21 DIAGNOSIS — L97909 Non-pressure chronic ulcer of unspecified part of unspecified lower leg with unspecified severity: Secondary | ICD-10-CM

## 2018-05-21 DIAGNOSIS — E11622 Type 2 diabetes mellitus with other skin ulcer: Secondary | ICD-10-CM

## 2018-05-21 LAB — GLUCOSE, CAPILLARY
GLUCOSE-CAPILLARY: 163 mg/dL — AB (ref 70–99)
Glucose-Capillary: 142 mg/dL — ABNORMAL HIGH (ref 70–99)
Glucose-Capillary: 139 mg/dL — ABNORMAL HIGH (ref 70–99)
Glucose-Capillary: 130 mg/dL — ABNORMAL HIGH (ref 70–99)

## 2018-05-21 LAB — BASIC METABOLIC PANEL
Anion gap: 7 (ref 5–15)
BUN: 9 mg/dL (ref 6–20)
CHLORIDE: 103 mmol/L (ref 98–111)
CO2: 24 mmol/L (ref 22–32)
CREATININE: 0.66 mg/dL (ref 0.44–1.00)
Calcium: 8.2 mg/dL — ABNORMAL LOW (ref 8.9–10.3)
GFR calc Af Amer: >60 mL/min (ref >60)
GFR calc non Af Amer: >60 mL/min (ref >60)
GLUCOSE: 245 mg/dL — AB (ref 70–99)
Potassium: 3.8 mmol/L (ref 3.5–5.1)
Sodium: 134 mmol/L — ABNORMAL LOW (ref 135–145)

## 2018-05-21 LAB — CBC WITH DIFFERENTIAL/PLATELET
Abs Immature Granulocytes: 0.1 10*3/uL (ref 0.0–0.1)
Basophils Absolute: 0.1 10*3/uL (ref 0.0–0.1)
Basophils Relative: 0 %
EOS PCT: 1 %
Eosinophils Absolute: 0.1 10*3/uL (ref 0.0–0.7)
HEMATOCRIT: 38.7 % (ref 36.0–46.0)
Hemoglobin: 12.4 g/dL (ref 12.0–15.0)
IMMATURE GRANULOCYTES: 1 %
LYMPHS ABS: 1 10*3/uL (ref 0.7–4.0)
Lymphocytes Relative: 8 %
MCH: 29.7 pg (ref 26.0–34.0)
MCHC: 32 g/dL (ref 30.0–36.0)
MCV: 92.6 fL (ref 78.0–100.0)
MONO ABS: 0.6 10*3/uL (ref 0.1–1.0)
MONOS PCT: 5 %
NEUTROS PCT: 85 %
Neutro Abs: 10.6 10*3/uL — ABNORMAL HIGH (ref 1.7–7.7)
PLATELETS: 331 10*3/uL (ref 150–400)
RBC: 4.18 MIL/uL (ref 3.87–5.11)
RDW: 14.5 % (ref 11.5–15.5)
WBC: 12.5 10*3/uL — ABNORMAL HIGH (ref 4.0–10.5)

## 2018-05-21 LAB — HEMOGLOBIN A1C
HEMOGLOBIN A1C: 8 % — AB (ref 4.8–5.6)
MEAN PLASMA GLUCOSE: 182.9 mg/dL

## 2018-05-21 LAB — C-REACTIVE PROTEIN: CRP: 15.7 mg/dL — ABNORMAL HIGH (ref <1.0)

## 2018-05-21 LAB — PREALBUMIN: Prealbumin: 9.1 mg/dL — ABNORMAL LOW (ref 18–38)

## 2018-05-21 LAB — HIV ANTIBODY (ROUTINE TESTING W REFLEX): HIV SCREEN 4TH GENERATION: NONREACTIVE

## 2018-05-21 LAB — SEDIMENTATION RATE: SED RATE: 33 mm/h — AB (ref 0–22)

## 2018-05-21 MED ORDER — JUVEN PO PACK
1.0000 | PACK | Freq: Two times a day (BID) | ORAL | Status: DC
  Administered 2018-05-23 – 2018-05-24 (×3): 1 via ORAL
  Filled 2018-05-21 (×7): qty 1

## 2018-05-21 MED ORDER — CHLORHEXIDINE GLUCONATE CLOTH 2 % EX PADS
6.0000 | MEDICATED_PAD | Freq: Once | CUTANEOUS | Status: AC
  Administered 2018-05-21: 6 via TOPICAL

## 2018-05-21 MED ORDER — GADOBENATE DIMEGLUMINE 529 MG/ML IV SOLN
20.0000 mL | Freq: Once | INTRAVENOUS | Status: AC
  Administered 2018-05-21: 20 mL via INTRAVENOUS

## 2018-05-21 MED ORDER — SODIUM CHLORIDE 0.9 % IV BOLUS
500.0000 mL | Freq: Once | INTRAVENOUS | Status: AC
  Administered 2018-05-21: 500 mL via INTRAVENOUS

## 2018-05-21 MED ORDER — INSULIN ASPART 100 UNIT/ML ~~LOC~~ SOLN
0.0000 [IU] | Freq: Three times a day (TID) | SUBCUTANEOUS | Status: DC
  Administered 2018-05-21 – 2018-05-23 (×4): 3 [IU] via SUBCUTANEOUS
  Administered 2018-05-23: 4 [IU] via SUBCUTANEOUS
  Administered 2018-05-24: 3 [IU] via SUBCUTANEOUS
  Administered 2018-05-24: 4 [IU] via SUBCUTANEOUS

## 2018-05-21 MED ORDER — INSULIN ASPART 100 UNIT/ML ~~LOC~~ SOLN: 0.0000 [IU] | Freq: Every day | SUBCUTANEOUS | Status: DC

## 2018-05-21 MED ORDER — ADULT MULTIVITAMIN W/MINERALS CH
1.0000 | ORAL_TABLET | Freq: Every day | ORAL | Status: DC
  Administered 2018-05-21 – 2018-05-24 (×3): 1 via ORAL
  Filled 2018-05-21 (×3): qty 1

## 2018-05-21 MED ORDER — VANCOMYCIN HCL IN DEXTROSE 1-5 GM/200ML-% IV SOLN
1000.0000 mg | Freq: Three times a day (TID) | INTRAVENOUS | Status: DC
  Administered 2018-05-21 – 2018-05-23 (×6): 1000 mg via INTRAVENOUS
  Filled 2018-05-21 (×7): qty 200

## 2018-05-21 NOTE — Plan of Care (Signed)

## 2018-05-21 NOTE — Social Work (Signed)
CSW acknowledging consult for "access meds for discharge." For medication access please consult RN case manager.   CSW signing off. Please consult if any additional needs arise.  Alexander Mt, [place] [facility] [phone]

## 2018-05-21 NOTE — Progress Notes (Signed)
PROGRESS NOTE    CHAYAH MCKEE  KXF:[id number] DOB: [birth year] DOA: 05/20/2018 PCP: Lavone Orn, MD   Brief Narrative:  HPI Per Dr. Mitzi Hansen on 05/20/18: HPI: ZALEIGH BERMINGHAM is a 47 y.o. female with medical history significant for insulin-dependent diabetes mellitus, paroxysmal SVT, and anxiety, now presenting to the emergency department for evaluation of fevers, chills, and increasing redness and swelling of the right foot.  Patient has had a diabetic ulcer at the plantar aspect of her right great toe followed by wound care for a year, recently developed redness and swelling of the first toe, and reports that this has continued to worsen despite being on Augmentin for the past 4 days.  Patient reports that erythema and edema have extended from the right first toe, to involve the foot and distal leg.  She had been experiencing suprapubic tenderness at time of an ED visit on 05/17/2018, but this has resolved with the Augmentin.  She has not noted any drainage from the toe ulcer.  She denies shortness of breath, cough, or chest pain.  She also has a chronic ulcer at the plantar aspect of the left great toe without any erythema, edema, or drainage.  ED Course: Upon arrival to the ED, patient is found to be febrile to 38.1 C and tachycardic to the 120s.  EKG features a sinus tachycardia with rate 126.  Radiographs of the right foot demonstrate soft tissue swelling of the great toe with plantar ulceration, soft tissue emphysema, and concern for underlying osteomyelitis.  Chemistry panel is unremarkable and CBC is notable for a leukocytosis to 16,600 and a thrombocytosis with platelets 409,000.  Lactic acid is reassuringly normal.  CRP is elevated to 17.4.  Blood cultures were collected, 30 cc/kg fluid bolus was given, and the patient was started on empiric vancomycin and Zosyn.  Blood pressure remained stable, tachycardia has improved, and patient will be admitted for ongoing evaluation and management  of sepsis secondary to infected diabetic foot ulcer with concern for underlying osteomyelitis.  **MRI was ordered for suspected Osteomyelitis and Podiatry was consulted. SSI was increased to Resistant Scale   Assessment & Plan:   Principal Problem:   Sepsis due to cellulitis [facility]) Active Problems:   Insulin-requiring or dependent type II diabetes mellitus (HCC)   SVT (supraventricular tachycardia) (HCC)   Diabetic foot ulcer (HCC)   UTI (urinary tract infection)  Sepsis due to infected diabetic foot ulcer; presumed Osteomyelitis  - Presents with fever/chills and increasing erythema and edema around a right great toe ulcer despite being on Augmentin since 05/17/18  - She is found to be febrile, tachycardic, with WBC 16,600, elevated CRP/ESR, and radiographs concerning for osteomyelitis  -sepsis physiology is improving -Blood cultures collected in ED, 30 cc/kg bolus given with LR (2.5 of LR and 500 mL Bolus NS), and she was treated with vancomycin and Zosyn  -IVF was stopped but will start gentle IVF with NS at 75 mL/hr -Checked ABI's, trend inflammatory markers, follow cultures and clinical course, continue empiric antibiotics with vancomycin, cefepime, and Flagyl for now   -Continue with antiemetics with ondansetron 4 mg p.o./IV every 6 as needed for nausea vomiting -Pain control with oxycodone-acetaminophen 1 to 2 tablets p.o. every 4 PRN for moderate pain -Patient's sed rate is now 33 and patient's decreased from 17.4 is now 50.7 -WBC is trending downward from 16.6 now 12.5 -Patient lactic acid level was 1.78 on admission -Obtain MRI of the foot and podiatry evaluation -Follow-up on MRI  results and appreciate podiatry evaluation and recommendation.  -Continue to follow cultures and continue empiric antibiotics as above  Insulin-Dependent DM  - No recent A1c, was 11.3% several yrs ago  and now hemoglobin A1c is 8.0 - At home, she is taking metformin, Trulicity, and Toujeo 413 units  qHS  and follows up with endocrinology Dr. Buddy Duty - Check CBG's and start with Lantus 90 units qHS and Novolog correctional was resistant scale, adjust as needed  -Order consulted for further evaluation recommendations -CBGs have been ranging from 142-163  UTI  - She was seen in ED on 8/30 with suprapubic tenderness, fever/chills, and found to have UTI  - Culture grew GBS  on - She has taken 4 days of Augmentin and sxs resolved  - Continued on antibiotics for foot as above, this will cover GBS     Paroxysmal SVT  - In sinus tachycardia on arrival in setting of sepsis, improved with IVF  - Continue home Verapamil 240 mg po Daily     Hyponatremia -Patient has a history of hyponatremia per her own report given her amount of water intake -IVF Hydration was stopped but will start gentle IVF hydration with NS at 75 mL/rh  DVT prophylaxis: SCDs  Code Status: FULL CODE Family Communication: No family present at bedside Disposition Plan: Remain Inpatient for continued to workup and Evaluation for possible Osteomyelitis   Consultants:   Podiatry Dr. Celesta Gentile    Procedures:  ABI ABI completed:   RIGHT    LEFT    PRESSURE WAVEFORM  PRESSURE WAVEFORM  BRACHIAL 132 Triphasic BRACHIAL 131 Triphasic  PT 151 Triphasic PT 143 Triphasic  DP 141 Triphasic DP 137 Triphasic  GREAT TOE 142 NA GREAT TOE 143 NA    RIGHT LEFT  ABI / TBI 1.14 / 1.08 1.08 / 1.08   ABIs, TBIs and Doppler waveforms are within normal limits bilaterally at rest.   Antimicrobials:  Anti-infectives (From admission, onward)   Start     Dose/Rate Route Frequency Ordered Stop   05/21/18 1600  vancomycin (VANCOCIN) IVPB 1000 mg/200 mL premix     1,000 mg 200 mL/hr over 60 Minutes Intravenous Every 8 hours 05/21/18 1328     05/21/18 0830  vancomycin (VANCOCIN) IVPB 1000 mg/200 mL premix  Status:  Discontinued     1,000 mg 200 mL/hr over 60 Minutes Intravenous Every 12 hours 05/20/18 1941 05/21/18  1328   05/20/18 2200  ceFEPIme (MAXIPIME) 2 g in sodium chloride 0.9 % 100 mL IVPB     2 g 200 mL/hr over 30 Minutes Intravenous Every 12 hours 05/20/18 2010     05/20/18 2100  metroNIDAZOLE (FLAGYL) IVPB 500 mg     500 mg 100 mL/hr over 60 Minutes Intravenous Every 8 hours 05/20/18 2003     05/20/18 2000  vancomycin (VANCOCIN) 2,000 mg in sodium chloride 0.9 % 500 mL IVPB     2,000 mg 250 mL/hr over 120 Minutes Intravenous  Once 05/20/18 1941 05/20/18 2313   05/20/18 1815  piperacillin-tazobactam (ZOSYN) IVPB 3.375 g     3.375 g 100 mL/hr over 30 Minutes Intravenous  Once 05/20/18 1803 05/20/18 1844     Subjective: Seen and examined at bedside and states that she is feeling somewhat better.  States her foot was hurting and was tender to palpate.  States she has been working with podiatry in outpatient setting but states that she acutely bumped her toe on Sunday and caused some bruising.  No other  concerns or complaints at this time.  Denied any chest pain, lightheadedness or dizziness.  Objective: Vitals:   05/20/18 2149 05/21/18 0519 05/21/18 0816 05/21/18 1314  BP: 121/70 (!) 100/52 121/78 129/75  Pulse: 93 91 93 97  Resp: _0 Temp: 99.3 F (37.4 C) 98.6 F (37 C) 98.7 F (37.1 C) 97.8 F (36.6 C)  TempSrc: Oral Oral Oral Axillary  SpO2: 95% 97% 98% 100%  Weight: 101.1 kg     Height:        Intake/Output Summary (Last 24 hours) at 05/21/2018 1525 Last data filed at 05/21/2018 1421 Gross per 24 hour  Intake 4062.91 ml  Output -  Net 4062.91 ml   Filed Weights   05/20/18 1917 05/20/18 2149  Weight: 108.9 kg 101.1 kg   Examination: Physical Exam:  Constitutional: WN/WD obese Caucasian female in NAD and appears calm but uncomfortable  Eyes: Lids and conjunctivae normal, sclerae anicteric  ENMT: External Ears, Nose appear normal. Grossly normal hearing. Neck: Appears normal, supple, no cervical masses, normal ROM, no appreciable thyromegaly, no JVD Respiratory:  Diminished to auscultation bilaterally, no wheezing, rales, rhonchi or crackles. Normal respiratory effort and patient is not tachypenic. Cardiovascular: RRR, no murmurs / rubs / gallops. S1 and S2 auscultated.  Abdomen: Soft, non-tender, Distended. No masses palpated. No appreciable hepatosplenomegaly. Bowel sounds positive x4.  GU: Deferred. Musculoskeletal: No clubbing / cyanosis of digits/nails. Good ROM, no contractures.  Skin: Right bit toe Inflamed and warm with swelling and some bruising. Has a slight wound on the plantar surface of big toes. No induration; Warm and dry.  Neurologic: CN 2-12 grossly intact with no focal deficits. Romberg sign and cerebellar reflexes not assessed.  Psychiatric: Normal judgment and insight. Alert and oriented x 3. Normal mood and appropriate affect.   Data Reviewed: I have personally reviewed following labs and imaging studies  CBC: Recent Labs  Lab 05/17/18 0843 05/20/18 1545 05/21/18 0448  WBC 11.2* 16.6* 12.5*  NEUTROABS  --  14.1* 10.6*  HGB 15.1* 15.5* 12.4  HCT 47.3* 47.7* 38.7  MCV 92.7 92.6 92.6  PLT 332 409* 592   Basic Metabolic Panel: Recent Labs  Lab 05/17/18 0843 05/20/18 1545 05/21/18 0448  NA 136 136 134*  K 4.0 4.0 3.8  CL 103 98 103  CO2 _1 GLUCOSE 234* 146* 245*  BUN _2 CREATININE 0.62 0.62 0.66  CALCIUM 9.0 9.4 8.2*   GFR: Estimated Creatinine Clearance: 106.2 mL/min (by C-G formula based on SCr of 0.66 mg/dL). Liver Function Tests: Recent Labs  Lab 05/17/18 0843 05/20/18 1545  AST 16 15  ALT 16 15  ALKPHOS 81 99  BILITOT 0.8 0.9  PROT 8.1 8.0  ALBUMIN 3.7 3.4*   Recent Labs  Lab 05/17/18 0843  LIPASE 29   No results for input(s): AMMONIA in the last 168 hours. Coagulation Profile: No results for input(s): INR, PROTIME in the last 168 hours. Cardiac Enzymes: No results for input(s): CKTOTAL, CKMB, CKMBINDEX, TROPONINI in the last 168 hours. BNP (last 3 results) No results for input(s):  PROBNP in the last 8760 hours. HbA1C: Recent Labs    05/21/18 0448  HGBA1C 8.0*   CBG: Recent Labs  Lab 05/17/18 0855 05/20/18 1655 05/20/18 2353 05/21/18 0757 05/21/18 1251  GLUCAP 228* 124* 147* 163* 142*   Lipid Profile: No results for input(s): CHOL, HDL, LDLCALC, TRIG, CHOLHDL, LDLDIRECT in the last 72 hours. Thyroid Function Tests: No results for  input(s): TSH, T4TOTAL, FREET4, T3FREE, THYROIDAB in the last 72 hours. Anemia Panel: No results for input(s): VITAMINB12, FOLATE, FERRITIN, TIBC, IRON, RETICCTPCT in the last 72 hours. Sepsis Labs: Recent Labs  Lab 05/17/18 0851 05/17/18 1301 05/20/18 1556 05/20/18 1838  LATICACIDVEN 1.10 0.70 1.61 1.78    Recent Results (from the past 240 hour(s))  Urine culture     Status: Abnormal   Collection Time: 05/17/18  9:26 AM  Result Value Ref Range Status   Specimen Description URINE, CATHETERIZED  Final   Special Requests NONE  Final   Culture (A)  Final    >=100,000 COLONIES/mL GROUP B STREP(S.AGALACTIAE)ISOLATED TESTING AGAINST S. AGALACTIAE NOT ROUTINELY PERFORMED DUE TO PREDICTABILITY OF AMP/PEN/VAN SUSCEPTIBILITY. Performed at [facility], [place] [address] [place], [place] [address]    Report Status 05/18/2018 FINAL  Final  Blood culture (routine x 2)     Status: None (Preliminary result)   Collection Time: 05/20/18  6:10 PM  Result Value Ref Range Status   Specimen Description BLOOD BLOOD LEFT FOREARM  Final   Special Requests   Final    BOTTLES DRAWN AEROBIC AND ANAEROBIC Blood Culture adequate volume   Culture   Final    NO GROWTH < 24 HOURS Performed at [facility], [place] [address] [place], [place] [address]    Report Status PENDING  Incomplete  Blood culture (routine x 2)     Status: None (Preliminary result)   Collection Time: 05/20/18  6:15 PM  Result Value Ref Range Status   Specimen Description BLOOD RIGHT ARM  Final   Special Requests   Final    BOTTLES DRAWN AEROBIC AND ANAEROBIC  Blood Culture results may not be optimal due to an excessive volume of blood received in culture bottles   Culture   Final    NO GROWTH < 24 HOURS Performed at [facility], [place] [address] [place],  [address]    Report Status PENDING  Incomplete    Radiology Studies: Mr Foot Right W Wo Contrast  Result Date: 05/21/2018 CLINICAL DATA:  Fevers, chills, redness of the right foot EXAM: MRI OF THE RIGHT FOREFOOT WITHOUT AND WITH CONTRAST TECHNIQUE: Multiplanar, multisequence MR imaging of the right forefoot was performed before and after the administration of intravenous contrast. CONTRAST:  87m MULTIHANCE GADOBENATE DIMEGLUMINE 529 MG/ML IV SOLN COMPARISON:  None. FINDINGS: Bones/Joint/Cartilage Bone marrow edema in the first distal phalanx with cortical irregularity along the plantar medial aspect most consistent with osteomyelitis. Severe soft tissue swelling around the first distal phalanx consistent with cellulitis. 2.4 x 1.5 x 1.7 cm ill-defined area of T2 hyperintensity and T1 hypointense without enhancement area in the plantar aspect of the great toe within the soft tissues with multiple low signal foci within the area of abnormality most concerning for abscess. No other marrow signal abnormality. No fracture or dislocation. Mild osteoarthritis of the first MTP joint. Mild osteoarthritis of the first IP joint. Small first MTP joint effusion. Ligaments Collateral ligaments are intact.  Lisfranc ligament is intact. Muscles and Tendons Muscles are normal.  No muscle atrophy. Soft tissues No other fluid collection or hematoma. Mild just generalized soft tissue swelling along the dorsal aspect of the foot with mild enhancement most concerning for cellulitis. IMPRESSION: 1. Osteomyelitis of the first distal phalanx with severe surrounding cellulitis. Soft tissue necrosis along the plantar aspect of the great toe measuring approximately 2.4 x 1.5 x 1.7 cm with air within the area of  necrosis. 2.  Cellulitis along the dorsal aspect of the foot. Electronically Signed   By: Kathreen Devoid   On: 05/21/2018 12:16   Dg Toe Great Right  Result Date: 05/20/2018 CLINICAL DATA:  Worsening diabetic foot ulcer. EXAM: RIGHT GREAT TOE COMPARISON:  None. FINDINGS: There soft tissue swelling of the right great toe with plantar soft tissue ulceration noted. Speckled soft tissue emphysema is seen along the plantar aspect of the great toe at the level of the tuft. Subtle osteopenic appearance of the tuft is noted more so along the lateral aspect and the possibility of osteomyelitis is not excluded. No joint dislocation or acute fracture. Mild soft tissue swelling over dorsum of the forefoot is also present. IMPRESSION: 1. Soft tissue swelling of the great toe with plantar soft tissue ulceration. 2. Soft tissue emphysema is identified of the great toe adjacent to the tuft. 3. Subtle osteopenic appearance involving the lateral aspect of the tuft of the great toe is identified. Findings raise suspicion for subtle changes of osteomyelitis. Electronically Signed   By: Ashley Royalty M.D.   On: 05/20/2018 18:49   Scheduled Meds: . insulin aspart  0-20 Units Subcutaneous TID WC  . insulin aspart  0-5 Units Subcutaneous QHS  . insulin glargine  90 Units Subcutaneous QHS  . multivitamin with minerals  1 tablet Oral Daily  . nutrition supplement (JUVEN)  1 packet Oral BID BM  . sodium chloride flush  3 mL Intravenous Q12H  . verapamil  240 mg Oral Daily   Continuous Infusions: . sodium chloride    . ceFEPime (MAXIPIME) IV Stopped (05/21/18 1017)  . metronidazole 500 mg (05/21/18 1309)  . vancomycin      LOS: 1 day   Kerney Elbe, DO [facility] PAGER is on [place]  If 7PM-7AM, please contact night-coverage [email or web address] Password [email or web address] 05/21/2018, 3:25 PM

## 2018-05-21 NOTE — Consult Note (Addendum)
Reason for Consult: right hallux osteomyelitis  Referring Physician: Raiford Noble, DO  [name] is an 47 y.o. female.  HPI: 47 year old female who is well-known to me was admitted to the hospital yesterday for cellulitis, sepsis given right hallux ulceration with cellulitis.  She had a MRI performed today which did reveal osteomyelitis.  She has had a chronic wound to the right hallux for some time but any healing.  I saw her on Friday and there was noted to be some mild erythema and she had some abdominal pain is in the emergency room.  She was given 1 dose of Unasyn and discharged home with Augmentin.  Despite being on Augmentin she had worsening of the toe wound.  She also states that she did hit her toe which stated aggravate the symptoms and she has noticed the area has opened up more since the injury.  She had fevers yesterday because she currently states that she feels well and she denies any fevers, chills, nausea, vomiting.  Past Medical History:  Diagnosis Date  . Atrial fibrillation ([place])   . Diabetic retinopathy ([place])   . Diabetic ulcer of right great toe [facility])    "been dealing w/it for ~ 1 yr" (05/20/2018)  . Polycystic ovarian syndrome   . Psoriasis   . Psoriatic arthritis ([place])    "pretty much all over; knees, hands, elbows" (05/20/2018)  . SVT (supraventricular tachycardia) ([place])    "had 1 chemical conversion here in the ER" (05/20/2018)  . Type II diabetes mellitus ([place])     Past Surgical History:  Procedure Laterality Date  . CESAREAN SECTION  2005  . FOOT FRACTURE SURGERY Left    pin put in by Dr. Jacqualyn Posey  . FRACTURE SURGERY    . TUBAL LIGATION  2005    Family History  Problem Relation Age of Onset  . Hypertension Mother   . Diabetes Mother   . Asthma Mother   . CAD Father   . Diabetes Sister   . Diabetes Brother   . Hypertension Brother     Social History:  reports that she has never smoked. She has never used smokeless tobacco. She reports that she  drank alcohol. She reports that she does not use drugs.  Allergies:  Allergies  Allergen Reactions  . Trulicity [Dulaglutide] Diarrhea and Nausea And Vomiting  . Ciprofloxacin Rash  . Keflex [Cephalexin] Rash  . Latex Rash    Medications: I have reviewed the patient's current medications.  Results for orders placed or performed during the hospital encounter of 05/20/18 (from the past 48 hour(s))  Comprehensive metabolic panel     Status: Abnormal   Collection Time: 05/20/18  3:45 PM  Result Value Ref Range   Sodium 136 135 - 145 mmol/L   Potassium 4.0 3.5 - 5.1 mmol/L   Chloride 98 98 - 111 mmol/L   CO2 27 22 - 32 mmol/L   Glucose, Bld 146 (H) 70 - 99 mg/dL   BUN 7 6 - 20 mg/dL   Creatinine, Ser 0.62 0.44 - 1.00 mg/dL   Calcium 9.4 8.9 - 10.3 mg/dL   Total Protein 8.0 6.5 - 8.1 g/dL   Albumin 3.4 (L) 3.5 - 5.0 g/dL   AST 15 15 - 41 U/L   ALT 15 0 - 44 U/L   Alkaline Phosphatase 99 38 - 126 U/L   Total Bilirubin 0.9 0.3 - 1.2 mg/dL   GFR calc non Af Amer >60 >60 mL/min   GFR calc  Af Amer >60 >60 mL/min    Comment: (NOTE) The eGFR has been calculated using the CKD EPI equation. This calculation has not been validated in all clinical situations. eGFR's persistently <60 mL/min signify possible Chronic Kidney Disease.    Anion gap 11 5 - 15    Comment: Performed at [facility] [address] [place], [place] [address]  CBC with Differential     Status: Abnormal   Collection Time: 05/20/18  3:45 PM  Result Value Ref Range   WBC 16.6 (H) 4.0 - 10.5 K/uL   RBC 5.15 (H) 3.87 - 5.11 MIL/uL   Hemoglobin 15.5 (H) 12.0 - 15.0 g/dL   HCT 47.7 (H) 36.0 - 46.0 %   MCV 92.6 78.0 - 100.0 fL   MCH 30.1 26.0 - 34.0 pg   MCHC 32.5 30.0 - 36.0 g/dL   RDW 14.6 11.5 - 15.5 %   Platelets 409 (H) 150 - 400 K/uL   Neutrophils Relative % 85 %   Neutro Abs 14.1 (H) 1.7 - 7.7 K/uL   Lymphocytes Relative 7 %   Lymphs Abs 1.2 0.7 - 4.0 K/uL   Monocytes Relative 6 %   Monocytes  Absolute 1.0 0.1 - 1.0 K/uL   Eosinophils Relative 1 %   Eosinophils Absolute 0.2 0.0 - 0.7 K/uL   Basophils Relative 0 %   Basophils Absolute 0.1 0.0 - 0.1 K/uL   Immature Granulocytes 1 %   Abs Immature Granulocytes 0.1 0.0 - 0.1 K/uL    Comment: Performed at [facility], [address], [place], [place] [address]  I-Stat CG4 Lactic Acid, ED     Status: None   Collection Time: 05/20/18  3:56 PM  Result Value Ref Range   Lactic Acid, Venous 1.61 0.5 - 1.9 mmol/L  CBG monitoring, ED     Status: Abnormal   Collection Time: 05/20/18  4:55 PM  Result Value Ref Range   Glucose-Capillary 124 (H) 70 - 99 mg/dL  Blood culture (routine x 2)     Status: None (Preliminary result)   Collection Time: 05/20/18  6:10 PM  Result Value Ref Range   Specimen Description BLOOD BLOOD LEFT FOREARM    Special Requests      BOTTLES DRAWN AEROBIC AND ANAEROBIC Blood Culture adequate volume   Culture      NO GROWTH < 24 HOURS Performed at [facility], [place] [address], [place], [place] [address]    Report Status PENDING   Sedimentation rate     Status: Abnormal   Collection Time: 05/20/18  6:15 PM  Result Value Ref Range   Sed Rate 34 (H) 0 - 22 mm/hr    Comment: Performed at [facility] [address], [place],  [address]  C-reactive protein     Status: Abnormal   Collection Time: 05/20/18  6:15 PM  Result Value Ref Range   CRP 17.4 (H) <1.0 mg/dL    Comment: Performed at [facility] [address], [place],  [address]  Blood culture (routine x 2)     Status: None (Preliminary result)   Collection Time: 05/20/18  6:15 PM  Result Value Ref Range   Specimen Description BLOOD RIGHT ARM    Special Requests      BOTTLES DRAWN AEROBIC AND ANAEROBIC Blood Culture results may not be optimal due to an excessive volume of blood received in culture bottles   Culture  NO GROWTH < 24 HOURS Performed at [facility] [address] [place], [place]  [address]    Report Status PENDING   I-Stat CG4 Lactic Acid, ED     Status: None   Collection Time: 05/20/18  6:38 PM  Result Value Ref Range   Lactic Acid, Venous 1.78 0.5 - 1.9 mmol/L  Urinalysis, Routine w reflex microscopic     Status: Abnormal   Collection Time: 05/20/18  7:51 PM  Result Value Ref Range   Color, Urine YELLOW YELLOW   APPearance HAZY (A) CLEAR   Specific Gravity, Urine 1.030 1.005 - 1.030   pH 5.0 5.0 - 8.0   Glucose, UA >=500 (A) NEGATIVE mg/dL   Hgb urine dipstick MODERATE (A) NEGATIVE   Bilirubin Urine NEGATIVE NEGATIVE   Ketones, ur NEGATIVE NEGATIVE mg/dL   Protein, ur NEGATIVE NEGATIVE mg/dL   Nitrite NEGATIVE NEGATIVE   Leukocytes, UA SMALL (A) NEGATIVE   RBC / HPF 11-20 0 - 5 RBC/hpf   WBC, UA 11-20 0 - 5 WBC/hpf   Bacteria, UA NONE SEEN NONE SEEN   Squamous Epithelial / LPF 0-5 0 - 5   Mucus PRESENT    Budding Yeast PRESENT     Comment: Performed at [facility], [address] [place], [place] [address]  Glucose, capillary     Status: Abnormal   Collection Time: 05/20/18 11:53 PM  Result Value Ref Range   Glucose-Capillary 147 (H) 70 - 99 mg/dL   Comment 1 Notify RN    Comment 2 Document in Chart   HIV antibody (Routine Testing)     Status: None   Collection Time: 05/21/18  4:48 AM  Result Value Ref Range   HIV Screen 4th Generation wRfx Non Reactive Non Reactive    Comment: (NOTE) Performed At: [facility] [place], [place] [id number] Rush Farmer MD ER:[id number]   Hemoglobin A1c     Status: Abnormal   Collection Time: 05/21/18  4:48 AM  Result Value Ref Range   Hgb A1c MFr Bld 8.0 (H) 4.8 - 5.6 %    Comment: (NOTE) Pre diabetes:          5.7%-6.4% Diabetes:              >6.4% Glycemic control for   <7.0% adults with diabetes    Mean Plasma Glucose 182.9 mg/dL    Comment: Performed at [facility] [address], [place], [place] [address]  Sedimentation rate     Status: Abnormal   Collection  Time: 05/21/18  4:48 AM  Result Value Ref Range   Sed Rate 33 (H) 0 - 22 mm/hr    Comment: Performed at [facility] [address] [place], [place] [address]  C-reactive protein     Status: Abnormal   Collection Time: 05/21/18  4:48 AM  Result Value Ref Range   CRP 15.7 (H) <1.0 mg/dL    Comment: Performed at [facility] [address], [place], [place] [address]  Prealbumin     Status: Abnormal   Collection Time: 05/21/18  4:48 AM  Result Value Ref Range   Prealbumin 9.1 (L) 18 - 38 mg/dL    Comment: Performed at [facility] [address], [place], [place] [address]  Basic metabolic panel     Status: Abnormal   Collection Time: 05/21/18  4:48 AM  Result Value Ref Range   Sodium 134 (L) 135 -  145 mmol/L   Potassium 3.8 3.5 - 5.1 mmol/L   Chloride 103 98 - 111 mmol/L   CO2 24 22 - 32 mmol/L   Glucose, Bld 245 (H) 70 - 99 mg/dL   BUN 9 6 - 20 mg/dL   Creatinine, Ser 0.66 0.44 - 1.00 mg/dL   Calcium 8.2 (L) 8.9 - 10.3 mg/dL   GFR calc non Af Amer >60 >60 mL/min   GFR calc Af Amer >60 >60 mL/min    Comment: (NOTE) The eGFR has been calculated using the CKD EPI equation. This calculation has not been validated in all clinical situations. eGFR's persistently <60 mL/min signify possible Chronic Kidney Disease.    Anion gap 7 5 - 15    Comment: Performed at [facility] [address] [place], [place] [address]  CBC WITH DIFFERENTIAL     Status: Abnormal   Collection Time: 05/21/18  4:48 AM  Result Value Ref Range   WBC 12.5 (H) 4.0 - 10.5 K/uL   RBC 4.18 3.87 - 5.11 MIL/uL   Hemoglobin 12.4 12.0 - 15.0 g/dL   HCT 38.7 36.0 - 46.0 %   MCV 92.6 78.0 - 100.0 fL   MCH 29.7 26.0 - 34.0 pg   MCHC 32.0 30.0 - 36.0 g/dL   RDW 14.5 11.5 - 15.5 %   Platelets 331 150 - 400 K/uL   Neutrophils Relative % 85 %   Neutro Abs 10.6 (H) 1.7 - 7.7 K/uL   Lymphocytes Relative 8 %   Lymphs Abs 1.0 0.7 - 4.0 K/uL   Monocytes Relative 5 %   Monocytes Absolute  0.6 0.1 - 1.0 K/uL   Eosinophils Relative 1 %   Eosinophils Absolute 0.1 0.0 - 0.7 K/uL   Basophils Relative 0 %   Basophils Absolute 0.1 0.0 - 0.1 K/uL   Immature Granulocytes 1 %   Abs Immature Granulocytes 0.1 0.0 - 0.1 K/uL    Comment: Performed at [facility] [address] [place], [place] [address]  Glucose, capillary     Status: Abnormal   Collection Time: 05/21/18  7:57 AM  Result Value Ref Range   Glucose-Capillary 163 (H) 70 - 99 mg/dL  Glucose, capillary     Status: Abnormal   Collection Time: 05/21/18 12:51 PM  Result Value Ref Range   Glucose-Capillary 142 (H) 70 - 99 mg/dL  Glucose, capillary     Status: Abnormal   Collection Time: 05/21/18  5:26 PM  Result Value Ref Range   Glucose-Capillary 139 (H) 70 - 99 mg/dL    Mr Foot Right W Wo Contrast  Result Date: 05/21/2018 CLINICAL DATA:  Fevers, chills, redness of the right foot EXAM: MRI OF THE RIGHT FOREFOOT WITHOUT AND WITH CONTRAST TECHNIQUE: Multiplanar, multisequence MR imaging of the right forefoot was performed before and after the administration of intravenous contrast. CONTRAST:  7m MULTIHANCE GADOBENATE DIMEGLUMINE 529 MG/ML IV SOLN COMPARISON:  None. FINDINGS: Bones/Joint/Cartilage Bone marrow edema in the first distal phalanx with cortical irregularity along the plantar medial aspect most consistent with osteomyelitis. Severe soft tissue swelling around the first distal phalanx consistent with cellulitis. 2.4 x 1.5 x 1.7 cm ill-defined area of T2 hyperintensity and T1 hypointense without enhancement area in the plantar aspect of the great toe within the soft tissues with multiple low signal foci within the area of abnormality most concerning for abscess. No other marrow signal abnormality. No fracture or dislocation. Mild osteoarthritis of the first MTP joint. Mild osteoarthritis  of the first IP joint. Small first MTP joint effusion. Ligaments Collateral ligaments are intact.  Lisfranc ligament is intact.  Muscles and Tendons Muscles are normal.  No muscle atrophy. Soft tissues No other fluid collection or hematoma. Mild just generalized soft tissue swelling along the dorsal aspect of the foot with mild enhancement most concerning for cellulitis. IMPRESSION: 1. Osteomyelitis of the first distal phalanx with severe surrounding cellulitis. Soft tissue necrosis along the plantar aspect of the great toe measuring approximately 2.4 x 1.5 x 1.7 cm with air within the area of necrosis. 2. Cellulitis along the dorsal aspect of the foot. Electronically Signed   By: Kathreen Devoid   On: 05/21/2018 12:16   Dg Toe Great Right  Result Date: 05/20/2018 CLINICAL DATA:  Worsening diabetic foot ulcer. EXAM: RIGHT GREAT TOE COMPARISON:  None. FINDINGS: There soft tissue swelling of the right great toe with plantar soft tissue ulceration noted. Speckled soft tissue emphysema is seen along the plantar aspect of the great toe at the level of the tuft. Subtle osteopenic appearance of the tuft is noted more so along the lateral aspect and the possibility of osteomyelitis is not excluded. No joint dislocation or acute fracture. Mild soft tissue swelling over dorsum of the forefoot is also present. IMPRESSION: 1. Soft tissue swelling of the great toe with plantar soft tissue ulceration. 2. Soft tissue emphysema is identified of the great toe adjacent to the tuft. 3. Subtle osteopenic appearance involving the lateral aspect of the tuft of the great toe is identified. Findings raise suspicion for subtle changes of osteomyelitis. Electronically Signed   By: Ashley Royalty M.D.   On: 05/20/2018 18:49    ROS Blood pressure 129/75, pulse 97, temperature 97.8 F (36.6 C), temperature source Axillary, resp. rate 18, height _0  (1.702 m), weight 101.1 kg, last menstrual period 05/13/2018, SpO2 100 %. Physical Exam General: AAO x3, NAD  Dermatological: Ulceration present to the plantar aspect the right hallux with edema and erythema to the  entire toe.  On the plantar distal portion of the toe is what appears to be hemorrhagic blister, abscess.  Small amount of purulence identified from the wound today.  There is erythema to the MPJ level.  There is no streaking identified today.  She states that she did have this and has resolved.  The wound on the plantar aspect left hallux is healed.        Vascular: DP, PT pulses palpable, CRT less than 3 seconds except for the right hallux which is delayed  Neruologic: Sensation decreased  Musculoskeletal: Discomfort.  Assessment/Plan: Right hallux osteomyelitis  -I reviewed the x-rays as well as MRI with her.  Her fever has improved. I did drain the area on the plantar hallux after discussing this with her and she agreed and there was quite a bit of purulence identified today. I will take intraoperative wound cultures.  However this point given the infection of the toe we discussed options.  After discussion she is elected to proceed with amputation of the toe.  We discussed the right hallux limitation at the level of the metatarsal phalangeal joint.  Discussed with her likely partial closure of the wound.  Discussed the postoperative course as well.  We discussed the risks as well as benefits of surgery and alternatives.  Discussed risks including but not limited to further amputation, spread of infection, leg loss/foot loss, transfer lesions, blood loss, heart attack, stroke, death.  We will plan on surgery tomorrow. NPO  after midnight. She has no further questions or concerns about this today and she and her sister who are in the room were in agreement to this. -Continue broad-spectrum antibiotics for now, Vancomycin/Cefepime/Zosyn -Has SCD for DVT ppx -Arterial studies WNL  Trula Slade 05/21/2018, 6:22 PM  C: [phone] O: [phone]

## 2018-05-21 NOTE — Progress Notes (Addendum)
Pharmacy Antibiotic Note  [name] is a 47 y.o. female admitted on 05/20/2018 with right foot infection and fever.  MRI shows osteomyelitis with necrosis.  Pharmacy has been consulted for vancomycin and cefepime dosing.  She is also on Flagyl.  SCr 0.66, CrCL 120 ml/min, now afebrile, WBC 12.5, LA 1.78.   Plan: Change vanc to 1gm IV Q8H for goal trough 15-20 mcg/mL Continue Cefepime 2g IV Q12H Continue Flagyl 500mg  IV Q8H per MD Monitor renal fxn, clinical progress, vanc trough at [facility]   Height: 5\' 7"  (170.2 cm) Weight: 222 lb 14.2 oz (101.1 kg) IBW/kg (Calculated) : 61.6  Temp (24hrs), Avg:99.1 F (37.3 C), Min:97.8 F (36.6 C), Max:100.5 F (38.1 C)  Recent Labs  Lab 05/17/18 0843 05/17/18 0851 05/17/18 1301 05/20/18 1545 05/20/18 1556 05/20/18 1838 05/21/18 0448  WBC 11.2*  --   --  16.6*  --   --  12.5*  CREATININE 0.62  --   --  0.62  --   --  0.66  LATICACIDVEN  --  1.10 0.70  --  1.61 1.78  --     Estimated Creatinine Clearance: 106.2 mL/min (by C-G formula based on SCr of 0.66 mg/dL).    Allergies  Allergen Reactions  . Trulicity [Dulaglutide] Diarrhea and Nausea And Vomiting  . Ciprofloxacin Rash  . Keflex [Cephalexin] Rash  . Latex Rash     Vanc 9/2 >> Cefepime 9/2 >> Flagyl 9/2 >> Augmentin PTA   9/2 UCx - 9/2 BCx -    [name] D. Mina Marble, PharmD, BCPS, [place] 05/21/2018, 1:30 PM

## 2018-05-21 NOTE — Progress Notes (Signed)
Initial Nutrition Assessment  DOCUMENTATION CODES:   Obesity unspecified  INTERVENTION:   -MVI with minerals daily -1 packet Juven BID, each packet provides 80 calories, 8 grams of carbohydrate, and 14 grams of amino acids; supplement contains CaHMB, glutamine, and arginine, to promote wound healing  NUTRITION DIAGNOSIS:   Increased nutrient needs related to wound healing as evidenced by estimated needs.  GOAL:   Patient will meet greater than or equal to 90% of their needs  MONITOR:   PO intake, Supplement acceptance, Labs, Weight trends, Skin, I & O's  REASON FOR ASSESSMENT:   Malnutrition Screening Tool, Consult Wound healing  ASSESSMENT:   [name] is a 47 y.o. female with medical history significant for insulin-dependent diabetes mellitus, paroxysmal SVT, and anxiety, now presenting to the emergency department for evaluation of fevers, chills, and increasing redness and swelling of the right foot.   Pt admitted with sepsis secondary to rt DM foot infection and osteomyelitis.   9/3- CWOCN evaluation  Attempted to examine pt x 2, however, pt was not in room at times of visits. No family present to provide additional history. Unable to perform nutrition-focused physical exam at this time.   Reviewed wt hx; per CareEverywhere, pt weighed 231# on 04/20/18. Pt has experienced a 3.9% wt loss over the past month, which I not significant for time frame.   Last Hgb A1c: 8.0 (05/21/18) PTA DM medications are 1000 mg metformin daily, 06-999 mg clapaglifozin-metofmin ER BID, 0.75mg /0.5 ml dulaglutide weekly, and 120 units insulin degludec daily..   Labs reviewed: Na: 134, CBGS: 147-163 (inpatient orders for glycemic control are 0-20 units insulin aspart TID with meals, 0-5 units insulin aspart q HS, and 90 units insulin glargine q HS).   NUTRITION - FOCUSED PHYSICAL EXAM:  Unable to obtain at this time.  Diet Order:   Diet Order            Diet heart healthy/carb  modified Room service appropriate? Yes; Fluid consistency: Thin  Diet effective now              EDUCATION NEEDS:   No education needs have been identified at this time  Skin:  Skin Assessment: Skin Integrity Issues: Skin Integrity Issues:: Diabetic Ulcer, Other (Comment) Diabetic Ulcer: chronic, non healing woung on right great toe plantar aspect Other: left great toe aspect nearly healed plantar wound as a result of friction in off loading boot  Last BM:  05/20/18  Height:   Ht Readings from Last 1 Encounters:  05/20/18 5\' 7"  (1.702 m)    Weight:   Wt Readings from Last 1 Encounters:  05/20/18 101.1 kg    Ideal Body Weight:  61.4 kg  BMI:  Body mass index is 34.91 kg/m.  Estimated Nutritional Needs:   Kcal:  1850-2050  Protein:  95-110 grams  Fluid:  >1.8 L    Tiersa Dayley A. Jimmye Norman, RD, LDN, CDE Pager: [phone] After hours Pager: [phone]

## 2018-05-21 NOTE — Progress Notes (Signed)
VASCULAR LAB PRELIMINARY  ARTERIAL  ABI completed:    RIGHT    LEFT    PRESSURE WAVEFORM  PRESSURE WAVEFORM  BRACHIAL 132 Triphasic BRACHIAL 131 Triphasic  PT 151 Triphasic PT 143 Triphasic  DP 141 Triphasic DP 137 Triphasic  GREAT TOE 142 NA GREAT TOE 143 NA    RIGHT LEFT  ABI / TBI 1.14 / 1.08 1.08 / 1.08   ABIs, TBIs and Doppler waveforms are within normal limits bilaterally at rest.  Annamary Buschman, RVS 05/21/2018, 12:20 PM

## 2018-05-21 NOTE — Progress Notes (Signed)
Spoke with patient about her diabetes. States that she was diagnosed 13 years ago beginning with gestational diabetes. States that her family has a very strong history of diabetes. Dr. 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   Takes Tresiba 180 units every HS and Xigduo XR for her diabetes. She does use a Freestyle Libre continuous glucose monitor at home. States that her blood sugars have been fairly controlled here in the hospital. Her HgbA1C is 8%. She would like to get it down to at least 7.8%.   Will continue to monitor blood sugars while in the hospital.   Harvel Ricks RN BSN CDE Diabetes Coordinator Pager: [phone]  8am-5pm

## 2018-05-21 NOTE — Progress Notes (Addendum)
Results for SCARLETH, BRAME (MRN [id number]) as of 05/21/2018 11:09  Ref. Range 05/17/2018 08:55 05/20/2018 16:55 05/20/2018 23:53 05/21/2018 07:57  Glucose-Capillary Latest Ref Range: 70 - 99 mg/dL 228 (H) 124 (H) 147 (H) 163 (H)  Noted that blood sugars are good. Received consult for diabetes coordinator. Patient takes Tresiba 180 units at HS and Xigduo XR at home.  Current orders: Lantus 90 units at HS, Novolog RESISTANT TID & HS.   Agree with current insulin orders. Titrate dosages as needed. Will continue to monitor blood sugars while in the [place] CDE Diabetes Coordinator Pager: [phone]  8am-5pm

## 2018-05-21 NOTE — Consult Note (Signed)
[facility] Nurse wound consult note Reason for Consult:Chronic, nonhealing wound on RGT plantar aspect.  LGT plantar aspect has wound that is nearly healed from friction in the off-loading boot.  Patient is closely followed by Dr. Earleen Newport (Podiatry) at the [facility] and saw him on Friday, 8/30. She sustained a traumatic injury to the RGT on Sunday evening, 8/25 from which there is residual ecchymosis and edema. Wound type: Trauma, infection Pressure Injury POA: /A Measurement:2cm x 1.5cm x 0.1cm Wound UXL:KGMW, dry Drainage (amount, consistency, odor) none Periwound:erythema, warmth, ecchymosis Dressing procedure/placement/frequency: Patient reports that erythema is resolving, but that an MRI is ordered. Consider consult to Orthopedics if results positive for osteomyelitis. I will provide Nursing with orders for topical care consistent with the POC in place from Dr. Earleen Newport i.e., wash with soap and water, rinse, dry thoroughly. Paint with betadine, allow to air dry. No dressing. (Patient reports latex sensitivity and is wary of tapes and other adhesives on her skin.) We will provide new non-skid socks daily.  [facility] nursing team will not follow, but will remain available to this patient, the nursing and medical teams.  Please re-consult if needed. Thanks, Maudie Flakes, MSN, RN, [place], Arther Abbott  Pager# [phone]

## 2018-05-22 ENCOUNTER — Inpatient Hospital Stay (HOSPITAL_COMMUNITY): Payer: No Typology Code available for payment source

## 2018-05-22 ENCOUNTER — Encounter (HOSPITAL_COMMUNITY): Payer: Self-pay | Admitting: Certified Registered Nurse Anesthetist

## 2018-05-22 ENCOUNTER — Encounter (HOSPITAL_COMMUNITY)
Admission: EM | Disposition: A | Discharge status: Home Health | Payer: Self-pay | Source: Home / Self Care | Attending: Internal Medicine

## 2018-05-22 ENCOUNTER — Inpatient Hospital Stay (HOSPITAL_COMMUNITY): Payer: No Typology Code available for payment source | Admitting: Certified Registered Nurse Anesthetist

## 2018-05-22 DIAGNOSIS — L0390 Cellulitis, unspecified: Secondary | ICD-10-CM

## 2018-05-22 DIAGNOSIS — M86171 Other acute osteomyelitis, right ankle and foot: Secondary | ICD-10-CM

## 2018-05-22 DIAGNOSIS — A419 Sepsis, unspecified organism: Principal | ICD-10-CM

## 2018-05-22 DIAGNOSIS — L03031 Cellulitis of right toe: Secondary | ICD-10-CM

## 2018-05-22 DIAGNOSIS — M86671 Other chronic osteomyelitis, right ankle and foot: Secondary | ICD-10-CM

## 2018-05-22 DIAGNOSIS — E119 Type 2 diabetes mellitus without complications: Secondary | ICD-10-CM

## 2018-05-22 DIAGNOSIS — Z794 Long term (current) use of insulin: Secondary | ICD-10-CM

## 2018-05-22 HISTORY — PX: AMPUTATION TOE: SHX6595

## 2018-05-22 LAB — CBC WITH DIFFERENTIAL/PLATELET
ABS IMMATURE GRANULOCYTES: 0.1 10*3/uL (ref 0.0–0.1)
Basophils Absolute: 0 10*3/uL (ref 0.0–0.1)
Basophils Relative: 1 %
Eosinophils Absolute: 0.3 10*3/uL (ref 0.0–0.7)
Eosinophils Relative: 3 %
HEMATOCRIT: 40.3 % (ref 36.0–46.0)
HEMOGLOBIN: 12.8 g/dL (ref 12.0–15.0)
Immature Granulocytes: 1 %
Lymphocytes Relative: 17 %
Lymphs Abs: 1.3 10*3/uL (ref 0.7–4.0)
MCH: 29.4 pg (ref 26.0–34.0)
MCHC: 31.8 g/dL (ref 30.0–36.0)
MCV: 92.4 fL (ref 78.0–100.0)
MONO ABS: 0.5 10*3/uL (ref 0.1–1.0)
MONOS PCT: 6 %
Neutro Abs: 5.7 10*3/uL (ref 1.7–7.7)
Neutrophils Relative %: 72 %
Platelets: 350 10*3/uL (ref 150–400)
RBC: 4.36 MIL/uL (ref 3.87–5.11)
RDW: 14.2 % (ref 11.5–15.5)
WBC: 7.9 10*3/uL (ref 4.0–10.5)

## 2018-05-22 LAB — COMPREHENSIVE METABOLIC PANEL
ALK PHOS: 74 U/L (ref 38–126)
ALT: 10 U/L (ref 0–44)
AST: 12 U/L — ABNORMAL LOW (ref 15–41)
Albumin: 2.6 g/dL — ABNORMAL LOW (ref 3.5–5.0)
Anion gap: 6 (ref 5–15)
BUN: 6 mg/dL (ref 6–20)
CALCIUM: 8.6 mg/dL — AB (ref 8.9–10.3)
CO2: 25 mmol/L (ref 22–32)
Chloride: 104 mmol/L (ref 98–111)
Creatinine, Ser: 0.48 mg/dL (ref 0.44–1.00)
Glucose, Bld: 111 mg/dL — ABNORMAL HIGH (ref 70–99)
Potassium: 3.5 mmol/L (ref 3.5–5.1)
Sodium: 135 mmol/L (ref 135–145)
Total Bilirubin: 0.6 mg/dL (ref 0.3–1.2)
Total Protein: 6.8 g/dL (ref 6.5–8.1)

## 2018-05-22 LAB — URINE CULTURE: Culture: >=100000 — AB

## 2018-05-22 LAB — GLUCOSE, CAPILLARY
GLUCOSE-CAPILLARY: 124 mg/dL — AB (ref 70–99)
GLUCOSE-CAPILLARY: 97 mg/dL (ref 70–99)
Glucose-Capillary: 88 mg/dL (ref 70–99)
Glucose-Capillary: 111 mg/dL — ABNORMAL HIGH (ref 70–99)
Glucose-Capillary: 113 mg/dL — ABNORMAL HIGH (ref 70–99)
Glucose-Capillary: 159 mg/dL — ABNORMAL HIGH (ref 70–99)

## 2018-05-22 LAB — SURGICAL PCR SCREEN
MRSA, PCR: POSITIVE — AB
Staphylococcus aureus: POSITIVE — AB

## 2018-05-22 LAB — PHOSPHORUS: PHOSPHORUS: 3.2 mg/dL (ref 2.5–4.6)

## 2018-05-22 LAB — MAGNESIUM: MAGNESIUM: 2 mg/dL (ref 1.7–2.4)

## 2018-05-22 SURGERY — AMPUTATION, TOE
Anesthesia: Monitor Anesthesia Care | Site: Toe | Laterality: Right

## 2018-05-22 MED ORDER — LACTATED RINGERS IV SOLN
INTRAVENOUS | Status: DC
  Administered 2018-05-22: 14:00:00 via INTRAVENOUS

## 2018-05-22 MED ORDER — LIDOCAINE HCL 2 % IJ SOLN
INTRAMUSCULAR | Status: AC
  Filled 2018-05-22: qty 20

## 2018-05-22 MED ORDER — FENTANYL CITRATE (PF) 100 MCG/2ML IJ SOLN
INTRAMUSCULAR | Status: DC | PRN
  Administered 2018-05-22: 75 ug via INTRAVENOUS
  Administered 2018-05-22: 25 ug via INTRAVENOUS

## 2018-05-22 MED ORDER — LACTATED RINGERS IV SOLN: INTRAVENOUS | Status: DC | PRN

## 2018-05-22 MED ORDER — MUPIROCIN 2 % EX OINT
1.0000 "application " | TOPICAL_OINTMENT | Freq: Two times a day (BID) | CUTANEOUS | Status: DC
  Administered 2018-05-22 – 2018-05-24 (×5): 1 via NASAL
  Filled 2018-05-22 (×2): qty 22

## 2018-05-22 MED ORDER — LIDOCAINE 2% (20 MG/ML) 5 ML SYRINGE
INTRAMUSCULAR | Status: AC
  Filled 2018-05-22: qty 5

## 2018-05-22 MED ORDER — MIDAZOLAM HCL 5 MG/5ML IJ SOLN
INTRAMUSCULAR | Status: DC | PRN
  Administered 2018-05-22: 2 mg via INTRAVENOUS

## 2018-05-22 MED ORDER — 0.9 % SODIUM CHLORIDE (POUR BTL) OPTIME
TOPICAL | Status: DC | PRN
  Administered 2018-05-22: 1000 mL

## 2018-05-22 MED ORDER — CHLORHEXIDINE GLUCONATE CLOTH 2 % EX PADS
6.0000 | MEDICATED_PAD | Freq: Every day | CUTANEOUS | Status: DC
  Administered 2018-05-22 – 2018-05-24 (×3): 6 via TOPICAL

## 2018-05-22 MED ORDER — ONDANSETRON HCL 4 MG/2ML IJ SOLN
INTRAMUSCULAR | Status: AC
  Filled 2018-05-22: qty 2

## 2018-05-22 MED ORDER — FENTANYL CITRATE (PF) 250 MCG/5ML IJ SOLN
INTRAMUSCULAR | Status: AC
  Filled 2018-05-22: qty 5

## 2018-05-22 MED ORDER — BUPIVACAINE HCL (PF) 0.5 % IJ SOLN
INTRAMUSCULAR | Status: DC | PRN
  Administered 2018-05-22: 20 mL

## 2018-05-22 MED ORDER — DEXAMETHASONE SODIUM PHOSPHATE 10 MG/ML IJ SOLN
INTRAMUSCULAR | Status: AC
  Filled 2018-05-22: qty 1

## 2018-05-22 MED ORDER — LIDOCAINE HCL 2 % IJ SOLN
INTRAMUSCULAR | Status: DC | PRN
  Administered 2018-05-22: 20 mL

## 2018-05-22 MED ORDER — PROPOFOL 500 MG/50ML IV EMUL
INTRAVENOUS | Status: DC | PRN
  Administered 2018-05-22: 100 ug/kg/min via INTRAVENOUS

## 2018-05-22 MED ORDER — PROPOFOL 10 MG/ML IV BOLUS
INTRAVENOUS | Status: DC | PRN
  Administered 2018-05-22: 20 mg via INTRAVENOUS

## 2018-05-22 MED ORDER — PHENYLEPHRINE 40 MCG/ML (10ML) SYRINGE FOR IV PUSH (FOR BLOOD PRESSURE SUPPORT)
PREFILLED_SYRINGE | INTRAVENOUS | Status: AC
  Filled 2018-05-22: qty 20

## 2018-05-22 MED ORDER — BUPIVACAINE HCL 0.5 % IJ SOLN
INTRAMUSCULAR | Status: AC
  Filled 2018-05-22: qty 1

## 2018-05-22 MED ORDER — PROPOFOL 10 MG/ML IV BOLUS
INTRAVENOUS | Status: AC
  Filled 2018-05-22: qty 20

## 2018-05-22 MED ORDER — MIDAZOLAM HCL 2 MG/2ML IJ SOLN
INTRAMUSCULAR | Status: AC
  Filled 2018-05-22: qty 2

## 2018-05-22 SURGICAL SUPPLY — 37 items
BANDAGE ACE 3X5.8 VEL STRL LF (GAUZE/BANDAGES/DRESSINGS) ×2 IMPLANT
BANDAGE ACE 4X5 VEL STRL LF (GAUZE/BANDAGES/DRESSINGS) ×2 IMPLANT
BLADE LONG MED 31X9 (MISCELLANEOUS) IMPLANT
BNDG CMPR 9X4 STRL LF SNTH (GAUZE/BANDAGES/DRESSINGS) ×1
BNDG CONFORM 2 STRL LF (GAUZE/BANDAGES/DRESSINGS) ×2 IMPLANT
BNDG ESMARK 4X9 LF (GAUZE/BANDAGES/DRESSINGS) ×2 IMPLANT
BNDG GAUZE ELAST 4 BULKY (GAUZE/BANDAGES/DRESSINGS) ×2 IMPLANT
CUFF TOURNIQUET SINGLE 18IN (TOURNIQUET CUFF) IMPLANT
DRSG ADAPTIC 3X8 NADH LF (GAUZE/BANDAGES/DRESSINGS) ×2 IMPLANT
DRSG EMULSION OIL 3X3 NADH (GAUZE/BANDAGES/DRESSINGS) ×2 IMPLANT
DURAPREP 26ML APPLICATOR (WOUND CARE) ×2 IMPLANT
ELECT REM PT RETURN 9FT ADLT (ELECTROSURGICAL) ×2
ELECTRODE REM PT RTRN 9FT ADLT (ELECTROSURGICAL) ×1 IMPLANT
GAUZE IODOFORM PACK 1/2 7832 (GAUZE/BANDAGES/DRESSINGS) ×2 IMPLANT
GAUZE SPONGE 4X4 12PLY STRL (GAUZE/BANDAGES/DRESSINGS) ×2 IMPLANT
GAUZE SPONGE 4X4 12PLY STRL LF (GAUZE/BANDAGES/DRESSINGS) ×2 IMPLANT
GLOVE BIO SURGEON STRL SZ7.5 (GLOVE) ×4 IMPLANT
GLOVE BIOGEL PI IND STRL 7.5 (GLOVE) ×1 IMPLANT
GLOVE BIOGEL PI INDICATOR 7.5 (GLOVE) ×1
GOWN STRL REUS W/ TWL LRG LVL3 (GOWN DISPOSABLE) ×1 IMPLANT
GOWN STRL REUS W/ TWL XL LVL3 (GOWN DISPOSABLE) ×1 IMPLANT
GOWN STRL REUS W/TWL LRG LVL3 (GOWN DISPOSABLE) ×2
GOWN STRL REUS W/TWL XL LVL3 (GOWN DISPOSABLE) ×2
KIT BASIN OR (CUSTOM PROCEDURE TRAY) ×2 IMPLANT
NDL SAFETY ECLIPSE 18X1.5 (NEEDLE) IMPLANT
NEEDLE HYPO 18GX1.5 SHARP (NEEDLE)
NEEDLE HYPO 25X1 1.5 SAFETY (NEEDLE) ×2 IMPLANT
NS IRRIG 1000ML POUR BTL (IV SOLUTION) IMPLANT
PACK ORTHO EXTREMITY (CUSTOM PROCEDURE TRAY) IMPLANT
PADDING CAST ABS 4INX4YD NS (CAST SUPPLIES) ×1
PADDING CAST ABS COTTON 4X4 ST (CAST SUPPLIES) ×1 IMPLANT
SUT MNCRL AB 3-0 PS2 18 (SUTURE) ×2 IMPLANT
SUT MNCRL AB 4-0 PS2 18 (SUTURE) IMPLANT
SUT MON AB 5-0 PS2 18 (SUTURE) ×2 IMPLANT
SUT PROLENE 4 0 PS 2 18 (SUTURE) ×2 IMPLANT
SYR 10ML LL (SYRINGE) IMPLANT
UNDERPAD 30X30 (UNDERPADS AND DIAPERS) ×2 IMPLANT

## 2018-05-22 NOTE — Progress Notes (Signed)
[facility] PROGRESS NOTE  [name] LEX:[id number] DOB: 01/14/1971 DOA: 05/20/2018  PCP: Lavone Orn, MD  Brief History/Interval Summary: 47 y.o.femalewith medical history significant forinsulin-dependent diabetes mellitus, paroxysmal SVT, and anxiety, presented to the emergency department for evaluation of fevers, chills, and increasing redness and swelling of the right foot. Patient has had a diabetic ulcer at the plantar aspect of her right great toe followed by wound care for a year, recently developed redness and swelling of the first toe, and reports that this has continued to worsen despite being on Augmentin prior to admission. His concern for osteomyelitis of the first distal phalanx.  Podiatry was consulted.  Patient was hospitalized and placed on broad-spectrum antibiotics.  Reason for Visit: Cellulitis and osteomyelitis involving the right first toe  Consultants: Podiatry  Procedures:   Lower extremity arterial Dopplers with ABI Within normal limits  Antibiotics: Vancomycin, cefepime and metronidazole  Subjective/Interval History: Patient states that pain in the right first toe is reasonably well controlled.  Denies any fever or chills.  ROS: Denies any shortness of breath or chest pain  Objective:  Vital Signs  Vitals:   05/21/18 1837 05/21/18 2041 05/22/18 0500 05/22/18 1152  BP: 123/65 128/68 102/68 117/68  Pulse: 91 93 81 (!) 101  Resp: _0 Temp: 98.2 F (36.8 C) 98.8 F (37.1 C) 98.2 F (36.8 C) 99 F (37.2 C)  TempSrc: Oral Oral Oral Oral  SpO2: 97% 98% 98% 98%  Weight:      Height:        Intake/Output Summary (Last 24 hours) at 05/22/2018 1304 Last data filed at 05/22/2018 0600 Gross per 24 hour  Intake 1399.7 ml  Output -  Net 1399.7 ml   Filed Weights   05/20/18 1917 05/20/18 2149  Weight: 108.9 kg 101.1 kg    General appearance: alert, cooperative, appears stated age and no distress Head: Normocephalic,  without obvious abnormality, atraumatic Resp: clear to auscultation bilaterally Cardio: regular rate and rhythm, S1, S2 normal, no murmur, click, rub or gallop GI: soft, non-tender; bowel sounds normal; no masses,  no organomegaly Extremities: Right foot is covered in dressing.  Good dorsalis pedis pulse on the right. Neurologic: No focal neurological deficits noted.  Lab Results:  Data Reviewed: I have personally reviewed following labs and imaging studies  CBC: Recent Labs  Lab 05/17/18 0843 05/20/18 1545 05/21/18 0448 05/22/18 0553  WBC 11.2* 16.6* 12.5* 7.9  NEUTROABS  --  14.1* 10.6* 5.7  HGB 15.1* 15.5* 12.4 12.8  HCT 47.3* 47.7* 38.7 40.3  MCV 92.7 92.6 92.6 92.4  PLT 332 409* 331 449    Basic Metabolic Panel: Recent Labs  Lab 05/17/18 0843 05/20/18 1545 05/21/18 0448 05/22/18 0553  NA 136 136 134* 135  K 4.0 4.0 3.8 3.5  CL 103 98 103 104  CO2 _1 GLUCOSE 234* 146* 245* 111*  BUN _2 CREATININE 0.62 0.62 0.66 0.48  CALCIUM 9.0 9.4 8.2* 8.6*  MG  --   --   --  2.0  PHOS  --   --   --  3.2    GFR: Estimated Creatinine Clearance: 106.2 mL/min (by C-G formula based on SCr of 0.48 mg/dL).  Liver Function Tests: Recent Labs  Lab 05/17/18 0843 05/20/18 1545 05/22/18 0553  AST 16 15 12*  ALT _3 ALKPHOS 81 99 74  BILITOT 0.8 0.9 0.6  PROT 8.1 8.0 6.8  ALBUMIN 3.7 3.4* 2.6*    Recent Labs  Lab 05/17/18 0843  LIPASE 29    HbA1C: Recent Labs    05/21/18 0448  HGBA1C 8.0*    CBG: Recent Labs  Lab 05/21/18 1251 05/21/18 1726 05/21/18 2038 05/22/18 0751 05/22/18 1113  GLUCAP 142* 139* 130* 88 111*     Recent Results (from the past 240 hour(s))  Urine culture     Status: Abnormal   Collection Time: 05/17/18  9:26 AM  Result Value Ref Range Status   Specimen Description URINE, CATHETERIZED  Final   Special Requests NONE  Final   Culture (A)  Final    >=100,000 COLONIES/mL GROUP B  STREP(S.AGALACTIAE)ISOLATED TESTING AGAINST S. AGALACTIAE NOT ROUTINELY PERFORMED DUE TO PREDICTABILITY OF AMP/PEN/VAN SUSCEPTIBILITY. Performed at [facility], [place] [address], [place], [place] [address]    Report Status 05/18/2018 FINAL  Final  Blood culture (routine x 2)     Status: None (Preliminary result)   Collection Time: 05/20/18  6:10 PM  Result Value Ref Range Status   Specimen Description BLOOD BLOOD LEFT FOREARM  Final   Special Requests   Final    BOTTLES DRAWN AEROBIC AND ANAEROBIC Blood Culture adequate volume   Culture   Final    NO GROWTH 2 DAYS Performed at [facility], [place] [address], [place], [place] [address]    Report Status PENDING  Incomplete  Blood culture (routine x 2)     Status: None (Preliminary result)   Collection Time: 05/20/18  6:15 PM  Result Value Ref Range Status   Specimen Description BLOOD RIGHT ARM  Final   Special Requests   Final    BOTTLES DRAWN AEROBIC AND ANAEROBIC Blood Culture results may not be optimal due to an excessive volume of blood received in culture bottles   Culture   Final    NO GROWTH 2 DAYS Performed at [facility], [place] [address] [place], [place] [address]    Report Status PENDING  Incomplete  Urine culture     Status: Abnormal   Collection Time: 05/20/18  7:51 PM  Result Value Ref Range Status   Specimen Description URINE, CATHETERIZED  Final   Special Requests   Final    NONE Performed at [facility], [place] [address] [place], [place] [address]    Culture >=100,000 COLONIES/mL YEAST (A)  Final   Report Status 05/22/2018 FINAL  Final  Surgical pcr screen     Status: Abnormal   Collection Time: 05/21/18  8:55 PM  Result Value Ref Range Status   MRSA, PCR POSITIVE (A) NEGATIVE Final    Comment: RESULT CALLED TO, READ BACK BY AND VERIFIED WITH: Ellwood Handler RN [id number] 05/22/18 A BROWNING    Staphylococcus aureus POSITIVE (A) NEGATIVE Final    Comment: (NOTE) The Xpert SA Assay (FDA approved for  NASAL specimens in patients 75 years of age and older), is one component of a comprehensive surveillance program. It is not intended to diagnose infection nor to guide or monitor treatment. Performed at [facility], [place] [address] [place], [place] [address]       Radiology Studies: Mr Foot Right W Wo Contrast  Result Date: 05/21/2018 CLINICAL DATA:  Fevers, chills, redness of the right foot EXAM: MRI OF THE RIGHT FOREFOOT WITHOUT AND WITH CONTRAST TECHNIQUE: Multiplanar, multisequence MR imaging of the right forefoot was performed before and after the administration of intravenous contrast. CONTRAST:  36m  MULTIHANCE GADOBENATE DIMEGLUMINE 529 MG/ML IV SOLN COMPARISON:  None. FINDINGS: Bones/Joint/Cartilage Bone marrow edema in the first distal phalanx with cortical irregularity along the plantar medial aspect most consistent with osteomyelitis. Severe soft tissue swelling around the first distal phalanx consistent with cellulitis. 2.4 x 1.5 x 1.7 cm ill-defined area of T2 hyperintensity and T1 hypointense without enhancement area in the plantar aspect of the great toe within the soft tissues with multiple low signal foci within the area of abnormality most concerning for abscess. No other marrow signal abnormality. No fracture or dislocation. Mild osteoarthritis of the first MTP joint. Mild osteoarthritis of the first IP joint. Small first MTP joint effusion. Ligaments Collateral ligaments are intact.  Lisfranc ligament is intact. Muscles and Tendons Muscles are normal.  No muscle atrophy. Soft tissues No other fluid collection or hematoma. Mild just generalized soft tissue swelling along the dorsal aspect of the foot with mild enhancement most concerning for cellulitis. IMPRESSION: 1. Osteomyelitis of the first distal phalanx with severe surrounding cellulitis. Soft tissue necrosis along the plantar aspect of the great toe measuring approximately 2.4 x 1.5 x 1.7 cm with air within the area of  necrosis. 2. Cellulitis along the dorsal aspect of the foot. Electronically Signed   By: Kathreen Devoid   On: 05/21/2018 12:16   Dg Toe Great Right  Result Date: 05/20/2018 CLINICAL DATA:  Worsening diabetic foot ulcer. EXAM: RIGHT GREAT TOE COMPARISON:  None. FINDINGS: There soft tissue swelling of the right great toe with plantar soft tissue ulceration noted. Speckled soft tissue emphysema is seen along the plantar aspect of the great toe at the level of the tuft. Subtle osteopenic appearance of the tuft is noted more so along the lateral aspect and the possibility of osteomyelitis is not excluded. No joint dislocation or acute fracture. Mild soft tissue swelling over dorsum of the forefoot is also present. IMPRESSION: 1. Soft tissue swelling of the great toe with plantar soft tissue ulceration. 2. Soft tissue emphysema is identified of the great toe adjacent to the tuft. 3. Subtle osteopenic appearance involving the lateral aspect of the tuft of the great toe is identified. Findings raise suspicion for subtle changes of osteomyelitis. Electronically Signed   By: Ashley Royalty M.D.   On: 05/20/2018 18:49     Medications:  Scheduled: . Chlorhexidine Gluconate Cloth  6 each Topical Daily  . insulin aspart  0-20 Units Subcutaneous TID WC  . insulin aspart  0-5 Units Subcutaneous QHS  . insulin glargine  90 Units Subcutaneous QHS  . multivitamin with minerals  1 tablet Oral Daily  . mupirocin ointment  1 application Nasal BID  . nutrition supplement (JUVEN)  1 packet Oral BID BM  . sodium chloride flush  3 mL Intravenous Q12H  . verapamil  240 mg Oral Daily   Continuous: . sodium chloride 20 mL (05/22/18 1138)  . ceFEPime (MAXIPIME) IV 2 g (05/22/18 1256)  . metronidazole 100 mL/hr at 05/22/18 0600  . vancomycin 1,000 mg (05/22/18 1144)   MBW:GYKZLD chloride, acetaminophen **OR** acetaminophen, albuterol, ALPRAZolam, ondansetron **OR** ondansetron (ZOFRAN) IV, oxyCODONE-acetaminophen,  senna-docusate, sodium chloride flush  Assessment/Plan:    Sepsis due to infected diabetic foot ulcer/osteomyelitis of the right first distal phalanx Patient with a known history of diabetes.  Long-term ulcer in the right foot.  Failed outpatient treatment with worsening of her infection.  Patient underwent MRI which raise concern for osteomyelitis.  Patient seen by podiatry.  Plan is for surgery this afternoon.  Bedside I&D was attempted yesterday.  ESR 33.  PT 15.7.  HIV nonreactive.  Patient currently on vancomycin cefepime and metronidazole which will be continued.  His physiology has improved.  WBC has improved.  Blood cultures negative so far.  She has good peripheral pulses.  ABIs are normal.  Insulin-dependent diabetes mellitus HbA1c 8.0.  She is followed by endocrinology as outpatient.  Monitor CBGs.  Continue current regimen for now.  Recent UTI Urine cultures grew strep agalactiae.  On antibiotics as mentioned above.  She was symptomatic last week.  Symptoms have resolved now.  Paroxysmal SVT Appears that she was in sinus tachycardia at the time of admission which was likely due to sepsis.  Continue to watch for now.  Hyponatremia Likely due to hypovolemia.  Sodium levels have improved.   DVT Prophylaxis: SCDs    Code Status: Full code Family Communication: Discussed with the patient Disposition Plan: Management as outlined above.  Await surgery this afternoon.    LOS: 2 days   [facility] Pager [phone] 05/22/2018, 1:04 PM  If 7PM-7AM, please contact night-coverage at [email or web address], password [facility]

## 2018-05-22 NOTE — Op Note (Signed)
PATIENT:  [name]  47 y.o. female  PRE-OPERATIVE DIAGNOSIS:  osteomyelitis  POST-OPERATIVE DIAGNOSIS:  osteomyelitis  PROCEDURE:  Procedure(s): AMPUTATION RIGHT TOE (Right)  SURGEON:  Surgeon(s) and Role:    * Trula Slade, DPM - Primary  PHYSICIAN ASSISTANT:   ASSISTANTS: none   ANESTHESIA:   MAC  EBL:  minimal   BLOOD ADMINISTERED:none  DRAINS: none   LOCAL MEDICATIONS USED:  OTHER 10 cc of 1:1 mix of lidocaine and marcaine plain  SPECIMEN:  Source of Specimen:  toe for pathology; wound culture   DISPOSITION OF SPECIMEN:  PATHOLOGY  COUNTS:  YES  TOURNIQUET:  * No tourniquets in log *  DICTATION: Dragon Dictation   PLAN OF CARE: Inpatient  PATIENT DISPOSITION:  PACU - hemodynamically stable.   Delay start of Pharmacological VTE agent (>24hrs) due to surgical blood loss or risk of bleeding: no  Indications for surgery: 47 year old female was admitted to the hospital for cellulitis and ulceration found of osteomyelitis.  Due to infection we discussed treatment options after long discussion she elected to proceed with amputation of the toe.  We discussed alternatives, risks, complications in detail no promises or guarantees were given that the outcome of the procedure and all questions were answered to the best my ability.  Procedure in detail: The patient was both verbally and visually identified by myself with nursing staff and anesthesia staff in the preoperative holding area.  She was then brought to the operating room via stretcher and placed on the operative table in supine position.  A well-padded pneumatic ankle tourniquet was applied however of note the tourniquet was not inflated during the procedure.  After timeout was performed a mixture of 10 cc of lidocaine and Marcaine plain was infiltrated in a regional block fashion around the surgical site.  The right lower extremities and scrubbed, prepped, draped in normal sterile  fashion.  Attention was then directed overlying the first MPJ which are modified fishmouth incision was planned on the first metatarsal phalangeal joint just proximal to the area of cellulitis.  Incision was made with a #15 blade scalpel to the epidermis and the dermis.  Bovie was utilized for further dissection.  At this time the metatarsal phalangeal joint was identified and the toe was disarticulated at this level.  The underlying metatarsal head was identified and was found to be hard in nature and white in color and there is no signs of infection of the bone.  After the toe was removed all tissue was debrided that it did not appear to be viable and was debrided to healthy bleeding tissue.  There is no tracking of the infection noted today.  A wound culture was obtained.  The incision was copiously irrigated with sterile saline and hemostasis was achieved.  The incision was then loosely reapproximated with Prolene.  I did leave the central portion packed open with half-inch iodoform packing.  Betadine was painted over the incision followed by dry sterile dressing.  At the conclusion of the procedure she was awoken from anesthesia and found to have tolerated the procedure well to any complications.  She was transferred to PACU with vital signs stable and vascular status intact.  We will plan on remaining in the hospital Friday I will change the dressing tomorrow.  Likely discharge home with 2 weeks of oral antibiotics.  Celesta Gentile, DPM

## 2018-05-22 NOTE — Transfer of Care (Signed)
Immediate Anesthesia Transfer of Care Note  Patient: [name]  Procedure(s) Performed: AMPUTATION RIGHT TOE (Right Toe)  Patient Location: PACU  Anesthesia Type:MAC  Level of Consciousness: awake, alert  and oriented  Airway & Oxygen Therapy: Patient Spontanous Breathing  Post-op Assessment: Report given to RN, Post -op Vital signs reviewed and stable and Patient moving all extremities X 4  Post vital signs: Reviewed and stable  Last Vitals:  Vitals Value Taken Time  BP 90/54 05/22/2018  3:06 PM  Temp    Pulse 87 05/22/2018  3:07 PM  Resp 21 05/22/2018  3:07 PM  SpO2 92 % 05/22/2018  3:07 PM  Vitals shown include unvalidated device data.  Last Pain:  Vitals:   05/22/18 1152  TempSrc: Oral  PainSc: 0-No pain         Complications: No apparent anesthesia complications

## 2018-05-22 NOTE — Brief Op Note (Signed)
05/22/2018  3:05 PM  PATIENT:  Donell Beers  47 y.o. female  PRE-OPERATIVE DIAGNOSIS:  osteomyelitis  POST-OPERATIVE DIAGNOSIS:  osteomyelitis  PROCEDURE:  Procedure(s): AMPUTATION RIGHT TOE (Right)  SURGEON:  Surgeon(s) and Role:    * Trula Slade, DPM - Primary  PHYSICIAN ASSISTANT:   ASSISTANTS: none   ANESTHESIA:   MAC  EBL:  minimal   BLOOD ADMINISTERED:none  DRAINS: none   LOCAL MEDICATIONS USED:  OTHER 10 cc of 1:1 mix of lidocaine and marcaine plain  SPECIMEN:  Source of Specimen:  toe for pathology; wound culture   DISPOSITION OF SPECIMEN:  PATHOLOGY  COUNTS:  YES  TOURNIQUET:  * No tourniquets in log *  DICTATION: Dragon Dictation   PLAN OF CARE: Inpatient  PATIENT DISPOSITION:  PACU - hemodynamically stable.   Delay start of Pharmacological VTE agent (>24hrs) due to surgical blood loss or risk of bleeding: no

## 2018-05-22 NOTE — Care Management Note (Signed)
Case Management Note  Patient Details  Name: [name] MRN: [id number] Date of Birth: 1971/07/06  Subjective/Objective:              osteomyelitis of R big toe      Action/Plan:  Plan is for amputation of toe today (9/4). Will continue to assess with post needs as is determined through recovery.   Expected Discharge Date:                  Expected Discharge Plan:     In-House Referral:     Discharge planning Services  CM Consult  Post Acute Care Choice:    Choice offered to:     DME Arranged:    DME Agency:     HH Arranged:    HH Agency:     Status of Service:  In process, will continue to follow  If discussed at Long Length of Stay Meetings, dates discussed:    Additional Comments:  Carles Collet, RN 05/22/2018, 11:21 AM

## 2018-05-22 NOTE — Progress Notes (Signed)
Subjective: Patient admitted for for cellulitis and wound to the right hallux. MRI done and confirmed osteomyelitis. Found to have quite a bit of purulence last night. After discussed she elected to proceed with amputation of the hallux. She is for surgery today and I am seeing her in the pre-op area.  Denies any systemic complaints such as fevers, chills, nausea, vomiting. No acute changes since last appointment, and no other complaints at this time.   Objective: AAO x3, NAD DP/PT pulses palpable bilaterally, CRT less than 3 seconds Ulceration on the plantar hallux and distal to this area is an area of necrosis. There is erythema extending to the distal MTPJ. No ascending cellulitis present. No other open lesion identified on the right foot today.  No pain with calf compression, swelling, warmth, erythema  Assessment: Osteomyelitis right hallux  Plan: -All treatment options discussed with the patient including all alternatives, risks, complications.  -Again discussed surgery and postop course. Plan for toe amputation. Discussed all alternatives, risks, complications and she agrees with proceeding with surgery. Consent for signed.  -NPO -She and her sister have no further questions or concerns  Celesta Gentile, DPM O: [phone] C: [phone]

## 2018-05-22 NOTE — Anesthesia Procedure Notes (Addendum)
Procedure Name: MAC Date/Time: 05/22/2018 2:20 PM Performed by: Leonor Liv, CRNA Pre-anesthesia Checklist: Patient identified, Emergency Drugs available, Suction available, Patient being monitored and Timeout performed Oxygen Delivery Method: Simple face mask Placement Confirmation: positive ETCO2 Dental Injury: Teeth and Oropharynx as per pre-operative assessment

## 2018-05-22 NOTE — Anesthesia Postprocedure Evaluation (Signed)
Anesthesia Post Note  Patient: [name]  Procedure(s) Performed: AMPUTATION RIGHT TOE (Right Toe)     Anesthesia Type: MAC    Last Vitals:  Vitals:   05/22/18 1600 05/22/18 1605  BP:  112/71  Pulse: 87 89  Resp: 12 12  Temp: (!) 36.3 C   SpO2: 92% 92%    Last Pain:  Vitals:   05/22/18 1600  TempSrc:   PainSc: 0-No pain                 Barnet Glasgow

## 2018-05-22 NOTE — Anesthesia Preprocedure Evaluation (Signed)
Anesthesia Evaluation  Patient identified by MRN, date of birth, ID band Patient awake    Reviewed: Allergy & Precautions, NPO status , Patient's Chart, lab work & pertinent test results  Airway Mallampati: II  TM Distance: >3 FB Neck ROM: Full    Dental no notable dental hx. (+) Teeth Intact, Dental Advisory Given   Pulmonary neg pulmonary ROS,    Pulmonary exam normal breath sounds clear to auscultation       Cardiovascular Exercise Tolerance: Good negative cardio ROS Normal cardiovascular exam+ dysrhythmias Atrial Fibrillation and Supra Ventricular Tachycardia  Rhythm:Regular Rate:Normal     Neuro/Psych  Headaches, negative psych ROS   GI/Hepatic negative GI ROS, Neg liver ROS,   Endo/Other  diabetes, Well Controlled, Insulin Dependent  Renal/GU Renal disease     Musculoskeletal negative musculoskeletal ROS (+)   Abdominal (+) + obese,   Peds  Hematology negative hematology ROS (+)   Anesthesia Other Findings   Reproductive/Obstetrics                             Lab Results  Component Value Date   WBC 7.9 05/22/2018   HGB 12.8 05/22/2018   HCT 40.3 05/22/2018   MCV 92.4 05/22/2018   PLT 350 05/22/2018    Anesthesia Physical Anesthesia Plan  ASA: III  Anesthesia Plan: MAC   Post-op Pain Management:    Induction: Intravenous  PONV Risk Score and Plan: Treatment may vary due to age or medical condition  Airway Management Planned: Natural Airway and Nasal Cannula  Additional Equipment:   Intra-op Plan:   Post-operative Plan:   Informed Consent: I have reviewed the patients History and Physical, chart, labs and discussed the procedure including the risks, benefits and alternatives for the proposed anesthesia with the patient or authorized representative who has indicated his/her understanding and acceptance.   Dental advisory given  Plan Discussed with:   Anesthesia  Plan Comments:         Anesthesia Quick Evaluation

## 2018-05-23 ENCOUNTER — Telehealth: Payer: Self-pay | Admitting: Podiatry

## 2018-05-23 ENCOUNTER — Encounter (HOSPITAL_COMMUNITY): Payer: Self-pay | Admitting: Podiatry

## 2018-05-23 ENCOUNTER — Telehealth: Payer: Self-pay | Admitting: *Deleted

## 2018-05-23 LAB — CBC
HCT: 38.8 % (ref 36.0–46.0)
Hemoglobin: 12.5 g/dL (ref 12.0–15.0)
MCH: 29.7 pg (ref 26.0–34.0)
MCHC: 32.2 g/dL (ref 30.0–36.0)
MCV: 92.2 fL (ref 78.0–100.0)
PLATELETS: 339 10*3/uL (ref 150–400)
RBC: 4.21 MIL/uL (ref 3.87–5.11)
RDW: 14.2 % (ref 11.5–15.5)
WBC: 7.6 10*3/uL (ref 4.0–10.5)

## 2018-05-23 LAB — BASIC METABOLIC PANEL
Anion gap: 8 (ref 5–15)
BUN: <5 mg/dL — ABNORMAL LOW (ref 6–20)
CO2: 25 mmol/L (ref 22–32)
Calcium: 8.2 mg/dL — ABNORMAL LOW (ref 8.9–10.3)
Chloride: 104 mmol/L (ref 98–111)
Creatinine, Ser: 0.59 mg/dL (ref 0.44–1.00)
Glucose, Bld: 130 mg/dL — ABNORMAL HIGH (ref 70–99)
Potassium: 3.6 mmol/L (ref 3.5–5.1)
Sodium: 137 mmol/L (ref 135–145)

## 2018-05-23 LAB — GLUCOSE, CAPILLARY
GLUCOSE-CAPILLARY: 183 mg/dL — AB (ref 70–99)
GLUCOSE-CAPILLARY: 153 mg/dL — AB (ref 70–99)
Glucose-Capillary: 121 mg/dL — ABNORMAL HIGH (ref 70–99)
Glucose-Capillary: 121 mg/dL — ABNORMAL HIGH (ref 70–99)

## 2018-05-23 MED ORDER — DOXYCYCLINE HYCLATE 100 MG PO TABS
100.0000 mg | ORAL_TABLET | Freq: Two times a day (BID) | ORAL | Status: DC
  Administered 2018-05-23 – 2018-05-24 (×2): 100 mg via ORAL
  Filled 2018-05-23 (×2): qty 1

## 2018-05-23 MED ORDER — DIPHENHYDRAMINE HCL 50 MG/ML IJ SOLN
25.0000 mg | Freq: Once | INTRAMUSCULAR | Status: AC
  Administered 2018-05-23: 25 mg via INTRAVENOUS
  Filled 2018-05-23: qty 1

## 2018-05-23 NOTE — Progress Notes (Signed)
Nutrition Follow-up  DOCUMENTATION CODES:   Obesity unspecified  INTERVENTION:   -Continue MVI with minerals daily -Continue 1 packet Juven BID, each packet provides 80 calories, 8 grams of carbohydrate, and 14 grams of amino acids; supplement contains CaHMB, glutamine, and arginine, to promote wound healing  NUTRITION DIAGNOSIS:   Increased nutrient needs related to wound healing as evidenced by estimated needs.  Ongoing  GOAL:   Patient will meet greater than or equal to 90% of their needs  Progressing  MONITOR:   PO intake, Supplement acceptance, Labs, Weight trends, Skin, I & O's  REASON FOR ASSESSMENT:   Malnutrition Screening Tool, Consult Wound healing  ASSESSMENT:   [name] is a 47 y.o. female with medical history significant for insulin-dependent diabetes mellitus, paroxysmal SVT, and anxiety, now presenting to the emergency department for evaluation of fevers, chills, and increasing redness and swelling of the right foot.   9/3- CWOCN evaluation 9/4- s/p rt great toe amputation  Pt sitting in recliner chair at time of visit, who is in good spirits at time of visit. She reports fair appetite now (noted meal completion 75-90%). Pt pt with good appetite, usually consumed 2 meals and a snack daily. Meals consisted usually of sandwiches, egg biscuit, and fruit or candy for snacks.   Pt denies any weight loss.  Discussed with pt DM control at home. She confirms that she is followed by endocrinologist Dr. Buddy Duty as an outpatient. She shares that she had good DM control up until recently related foot infection. Per pt, she has had chronic wound on rt toe for approximately a year without much improvement. She is compliant with home DM regimen.    Discussed importance of good meal and supplement intake to promote healing. Pt is taking Juven and MVI. Pt with no further questions regarding nutrition and diabetes at this time.   Labs reviewed: CBGS: 97-183  (inpatient orders for glycemic control are 0-20 units insulin aspart TID with meals, 0-5 units insulin aspart q HS, and 90 grams insulin glargine q HS).   NUTRITION - FOCUSED PHYSICAL EXAM:    Most Recent Value  Orbital Region  No depletion  Upper Arm Region  No depletion  Thoracic and Lumbar Region  No depletion  Buccal Region  No depletion  Temple Region  No depletion  Clavicle Bone Region  No depletion  Clavicle and Acromion Bone Region  No depletion  Scapular Bone Region  No depletion  Dorsal Hand  No depletion  Patellar Region  No depletion  Anterior Thigh Region  No depletion  Posterior Calf Region  No depletion  Edema (RD Assessment)  None  Hair  Reviewed  Eyes  Reviewed  Mouth  Reviewed  Skin  Reviewed  Nails  Reviewed       Diet Order:   Diet Order            Diet Carb Modified Fluid consistency: Thin; Room service appropriate? Yes  Diet effective now              EDUCATION NEEDS:   No education needs have been identified at this time  Skin:  Skin Assessment: Skin Integrity Issues: Skin Integrity Issues:: Incisions Diabetic Ulcer: chronic, non healing wound on right great toe plantar aspect Incisions: closed rt foot incision Other: left great toe aspect nearly healed plantar wound as a result of friction in off loading boot  Last BM:  05/22/18  Height:   Ht Readings from Last 1 Encounters:  05/22/18  5' 7.01" (1.702 m)    Weight:   Wt Readings from Last 1 Encounters:  05/22/18 101.1 kg    Ideal Body Weight:  61.4 kg  BMI:  Body mass index is 34.9 kg/m.  Estimated Nutritional Needs:   Kcal:  1850-2050  Protein:  95-110 grams  Fluid:  >1.8 L    Jalilah Wiltsie A. Jimmye Norman, RD, LDN, CDE Pager: [phone] After hours Pager: [phone]

## 2018-05-23 NOTE — Evaluation (Signed)
Physical Therapy Evaluation Patient Details Name: [name] MRN: [id number] DOB: [birth year] Today's Date: 05/23/2018   History of Present Illness  Patient is a 47 y/o female presenting with diabetic foot wound with associated fevers, chills. Amputation of R great toe on 05/22/18 with NWB orders. PMH significant for insulin-dependent diabetes mellitus, paroxysmal SVT, and anxiety.  Clinical Impression  [name] is a very pleasant 47 y/o female admitted with the above listed diagnosis. Patient reports that prior to admission she was Ind with mobility and ADLs. Patient today requiring Min A/min guard for safety and stability. Education on WB status with patient reporting she had already been walking on R LE as well as great difficulty maintaining WB status for short ambulation distance in room. Will benefit from Darco shoe if MD permits. PT to follow acutely to maximize safe functional mobility.     Follow Up Recommendations Supervision for mobility/OOB;Home health PT    Equipment Recommendations  Rolling walker with 5" wheels(has knee walker)    Recommendations for Other Services       Precautions / Restrictions Precautions Precautions: Fall Restrictions Weight Bearing Restrictions: Yes RLE Weight Bearing: Non weight bearing Other Position/Activity Restrictions: patient with difficulty maintaining - may benefit from Darco show if MD allows WB through heel      Mobility  Bed Mobility               General bed mobility comments: up in recliner  Transfers Overall transfer level: Needs assistance Equipment used: Rolling walker (2 wheeled) Transfers: Sit to/from Stand Sit to Stand: Min guard         General transfer comment: min guard for immediate standing balance - does not maintain WB  Ambulation/Gait Ambulation/Gait assistance: Min guard;Min assist Gait Distance (Feet): 12 Feet Assistive device: Rolling walker (2 wheeled) Gait Pattern/deviations: Step-to  pattern;Trunk flexed     General Gait Details: max cues to maintain WB status - does not maintain - will likely need Darco shoe is allowed  Stairs            Wheelchair Mobility    Modified Rankin (Stroke Patients Only)       Balance Overall balance assessment: Mild deficits observed, not formally tested                                           Pertinent Vitals/Pain Pain Assessment: Faces Faces Pain Scale: Hurts a little bit Pain Location: R foot Pain Descriptors / Indicators: Aching;Sore Pain Intervention(s): Limited activity within patient's tolerance;Monitored during session;Repositioned    Home Living Family/patient expects to be discharged to:: Private residence Living Arrangements: Spouse/significant other;Children;Parent Available Help at Discharge: Family Type of Home: House Home Access: Level entry     Home Layout: Two level;Able to live on main level with bedroom/bathroom Home Equipment: (knee walker)      Prior Function Level of Independence: Independent         Comments: works as Sports coach        Extremity/Trunk Assessment        Lower Extremity Assessment Lower Extremity Assessment: Generalized weakness    Cervical / Trunk Assessment Cervical / Trunk Assessment: Normal  Communication   Communication: No difficulties  Cognition Arousal/Alertness: Awake/alert Behavior During Therapy: WFL for tasks assessed/performed Overall Cognitive Status: Within Functional Limits for tasks assessed  General Comments      Exercises     Assessment/Plan    PT Assessment Patient needs continued PT services  PT Problem List Decreased strength;Decreased activity tolerance;Decreased balance;Decreased mobility;Decreased knowledge of use of DME;Decreased safety awareness;Decreased knowledge of precautions       PT Treatment Interventions DME  instruction;Gait training;Functional mobility training;Therapeutic activities;Therapeutic exercise;Balance training;Stair training;Patient/family education    PT Goals (Current goals can be found in the Care Plan section)  Acute Rehab PT Goals Patient Stated Goal: return home PT Goal Formulation: With patient Time For Goal Achievement: 06/06/18 Potential to Achieve Goals: Good    Frequency Min 3X/week   Barriers to discharge        Co-evaluation               AM-PAC PT "6 Clicks" Daily Activity  Outcome Measure Difficulty turning over in bed (including adjusting bedclothes, sheets and blankets)?: A Little Difficulty moving from lying on back to sitting on the side of the bed? : A Little Difficulty sitting down on and standing up from a chair with arms (e.g., wheelchair, bedside commode, etc,.)?: Unable Help needed moving to and from a bed to chair (including a wheelchair)?: A Little Help needed walking in hospital room?: A Little Help needed climbing 3-5 steps with a railing? : Total 6 Click Score: 14    End of Session Equipment Utilized During Treatment: Gait belt Activity Tolerance: Patient tolerated treatment well Patient left: in chair;with call bell/phone within reach Nurse Communication: Mobility status PT Visit Diagnosis: Unsteadiness on feet (R26.81);Other abnormalities of gait and mobility (R26.89);Muscle weakness (generalized) (M62.81)    Time: 8756-4332 PT Time Calculation (min) (ACUTE ONLY): 15 min   Charges:   PT Evaluation $PT Eval Moderate Complexity: 1 Mod          Lanney Gins, PT, DPT 05/23/18 2:20 PM Pager: [phone]

## 2018-05-23 NOTE — Progress Notes (Signed)
[facility] PROGRESS NOTE  [name] KZL:[id number] DOB: [birth year] DOA: 05/20/2018  PCP: Lavone Orn, MD  Brief History/Interval Summary: 47 y.o.femalewith medical history significant forinsulin-dependent diabetes mellitus, paroxysmal SVT, and anxiety, presented to the emergency department for evaluation of fevers, chills, and increasing redness and swelling of the right foot. Patient has had a diabetic ulcer at the plantar aspect of her right great toe followed by wound care for a year, recently developed redness and swelling of the first toe, and reports that this has continued to worsen despite being on Augmentin prior to admission. His concern for osteomyelitis of the first distal phalanx.  Podiatry was consulted.  Patient was hospitalized and placed on broad-spectrum antibiotics.  Reason for Visit: Cellulitis and osteomyelitis involving the right first toe  Consultants: Podiatry  Procedures:   Amputation of the right toe 9/4  Lower extremity arterial Dopplers with ABI Within normal limits  Antibiotics: Vancomycin, cefepime and metronidazole  Subjective/Interval History: Patient states that she feels well.  Pain is reasonably well controlled.  Denies any fever or chills.  No nausea or vomiting.    ROS: Denies any chest pain or shortness of breath  Objective:  Vital Signs  Vitals:   05/22/18 1900 05/22/18 2113 05/23/18 0244 05/23/18 0513  BP: (!) 104/57 (!) 151/72 103/61 110/63  Pulse: 90 100 85 89  Resp:  '18 18 18  ' Temp: 98.6 F (37 C) 98.6 F (37 C) 98.5 F (36.9 C) 98.7 F (37.1 C)  TempSrc:  Oral Oral Oral  SpO2: 93% 100% 97% 97%  Weight:      Height:        Intake/Output Summary (Last 24 hours) at 05/23/2018 1212 Last data filed at 05/23/2018 1002 Gross per 24 hour  Intake 2152.88 ml  Output 10 ml  Net 2142.88 ml   Filed Weights   05/20/18 1917 05/20/18 2149 05/22/18 1347  Weight: 108.9 kg 101.1 kg 101.1 kg    General appearance:  Awake alert.  In no distress Resp: Normal effort.  Clear to auscultation bilaterally Cardio: S1-S2 is normal regular.  No S3-S4.  No rubs murmurs or bruit GI: Abdomen remains soft.  Nontender nondistended Extremities: Right foot is covered in dressing.  Neurologic: No focal neurological deficits.  Lab Results:  Data Reviewed: I have personally reviewed following labs and imaging studies  CBC: Recent Labs  Lab 05/17/18 0843 05/20/18 1545 05/21/18 0448 05/22/18 0553 05/23/18 0541  WBC 11.2* 16.6* 12.5* 7.9 7.6  NEUTROABS  --  14.1* 10.6* 5.7  --   HGB 15.1* 15.5* 12.4 12.8 12.5  HCT 47.3* 47.7* 38.7 40.3 38.8  MCV 92.7 92.6 92.6 92.4 92.2  PLT 332 409* 331 350 390    Basic Metabolic Panel: Recent Labs  Lab 05/17/18 0843 05/20/18 1545 05/21/18 0448 05/22/18 0553 05/23/18 0541  NA 136 136 134* 135 137  K 4.0 4.0 3.8 3.5 3.6  CL 103 98 103 104 104  CO2 '26 27 24 25 25  ' GLUCOSE 234* 146* 245* 111* 130*  BUN '6 7 9 6 ' <5*  CREATININE 0.62 0.62 0.66 0.48 0.59  CALCIUM 9.0 9.4 8.2* 8.6* 8.2*  MG  --   --   --  2.0  --   PHOS  --   --   --  3.2  --     GFR: Estimated Creatinine Clearance: 106.2 mL/min (by C-G formula based on SCr of 0.59 mg/dL).  Liver Function Tests: Recent Labs  Lab 05/17/18 0843 05/20/18 1545  05/22/18 0553  AST 16 15 12*  ALT '16 15 10  ' ALKPHOS 81 99 74  BILITOT 0.8 0.9 0.6  PROT 8.1 8.0 6.8  ALBUMIN 3.7 3.4* 2.6*    Recent Labs  Lab 05/17/18 0843  LIPASE 29    HbA1C: Recent Labs    05/21/18 0448  HGBA1C 8.0*    CBG: Recent Labs  Lab 05/22/18 1324 05/22/18 1531 05/22/18 1649 05/22/18 2107 05/23/18 0807  GLUCAP 124* 113* 97 159* 121*     Recent Results (from the past 240 hour(s))  Urine culture     Status: Abnormal   Collection Time: 05/17/18  9:26 AM  Result Value Ref Range Status   Specimen Description URINE, CATHETERIZED  Final   Special Requests NONE  Final   Culture (A)  Final    >=100,000 COLONIES/mL GROUP B  STREP(S.AGALACTIAE)ISOLATED TESTING AGAINST S. AGALACTIAE NOT ROUTINELY PERFORMED DUE TO PREDICTABILITY OF AMP/PEN/VAN SUSCEPTIBILITY. Performed at [facility], [place] [address], [place], [place] [address]    Report Status 05/18/2018 FINAL  Final  Blood culture (routine x 2)     Status: None (Preliminary result)   Collection Time: 05/20/18  6:10 PM  Result Value Ref Range Status   Specimen Description BLOOD BLOOD LEFT FOREARM  Final   Special Requests   Final    BOTTLES DRAWN AEROBIC AND ANAEROBIC Blood Culture adequate volume   Culture   Final    NO GROWTH 3 DAYS Performed at [facility], [place] [address], [place], [place] [address]    Report Status PENDING  Incomplete  Blood culture (routine x 2)     Status: None (Preliminary result)   Collection Time: 05/20/18  6:15 PM  Result Value Ref Range Status   Specimen Description BLOOD RIGHT ARM  Final   Special Requests   Final    BOTTLES DRAWN AEROBIC AND ANAEROBIC Blood Culture results may not be optimal due to an excessive volume of blood received in culture bottles   Culture   Final    NO GROWTH 3 DAYS Performed at [facility], [place] [address] [place], [place] [address]    Report Status PENDING  Incomplete  Urine culture     Status: Abnormal   Collection Time: 05/20/18  7:51 PM  Result Value Ref Range Status   Specimen Description URINE, CATHETERIZED  Final   Special Requests   Final    NONE Performed at [facility], [place] [address] [place], [place] [address]    Culture >=100,000 COLONIES/mL YEAST (A)  Final   Report Status 05/22/2018 FINAL  Final  Surgical pcr screen     Status: Abnormal   Collection Time: 05/21/18  8:55 PM  Result Value Ref Range Status   MRSA, PCR POSITIVE (A) NEGATIVE Final    Comment: RESULT CALLED TO, READ BACK BY AND VERIFIED WITH: Ellwood Handler RN 0126 05/22/18 A BROWNING    Staphylococcus aureus POSITIVE (A) NEGATIVE Final    Comment: (NOTE) The Xpert SA Assay (FDA approved for  NASAL specimens in patients 5 years of age and older), is one component of a comprehensive surveillance program. It is not intended to diagnose infection nor to guide or monitor treatment. Performed at [facility], [place] [address], [place],  [address]   Aerobic/Anaerobic Culture (surgical/deep wound)     Status: None (Preliminary result)   Collection Time: 05/22/18  2:42 PM  Result Value Ref Range Status   Specimen Description  WOUND RIGHT TOE GREAT  Final   Special Requests NONE  Final   Gram Stain NO WBC SEEN NO ORGANISMS SEEN   Final   Culture   Final    NO GROWTH < 24 HOURS Performed at [facility], [place] [address] [place], [place] [address]    Report Status PENDING  Incomplete      Radiology Studies: Dg Foot 2 Views Right  Result Date: 05/22/2018 CLINICAL DATA:  Postop check EXAM: RIGHT FOOT - 2 VIEW COMPARISON:  MRI 05/21/2018 FINDINGS: Amputation of the great toe at the first MTP joint without immediate postoperative complicating features identified since recent comparison MRI. Calcaneal enthesopathy is seen along plantar and dorsal aspect. Degenerative spurring is noted across the dorsum of the midfoot. No joint effusion, malalignment nor bone destruction. Forefoot soft tissue swelling is identified. 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 Prominent accessory ossicle adjacent to the tarsal navicular. IMPRESSION: No immediate complications status post amputation of the great toe at the first MTP level. Electronically Signed   By: Ashley Royalty M.D.   On: 05/22/2018 17:56     Medications:  Scheduled: . Chlorhexidine Gluconate Cloth  6 each Topical Daily  . insulin aspart  0-20 Units Subcutaneous TID WC  . insulin aspart  0-5 Units Subcutaneous QHS  . insulin glargine  90 Units Subcutaneous QHS  . multivitamin with minerals  1 tablet Oral Daily  . mupirocin ointment  1 application Nasal  BID  . nutrition supplement (JUVEN)  1 packet Oral BID BM  . sodium chloride flush  3 mL Intravenous Q12H  . verapamil  240 mg Oral Daily   Continuous: . sodium chloride Stopped (05/22/18 1452)  . ceFEPime (MAXIPIME) IV 2 g (05/23/18 1028)  . lactated ringers 10 mL/hr at 05/22/18 1354  . metronidazole 500 mg (05/23/18 0507)  . 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  Long-term ulcer in the right foot.  Failed outpatient treatment with worsening of her infection.  Patient underwent MRI which raised concern for osteomyelitis.  Patient seen by podiatry.  Patient underwent amputation of her right first toe on 9/4.  ESR was 33.  CRP 15.7.  HIV nonreactive.  Discussed with podiatrist yesterday.  Continue IV antibiotics for today.  She is currently on vancomycin cefepime and metronidazole.  Sepsis physiology has improved.  WBC has been improving.  Blood cultures negative so far.  Hopefully transition to oral antibiotics soon and discharge in the next 24 to 48 hours. She has good peripheral pulses.  ABIs are normal.  Insulin-dependent diabetes mellitus HbA1c 8.0.  She is followed by endocrinology as outpatient.  Monitor CBGs.  Continue current regimen for now.  CBGs well controlled.  Recent UTI Urine cultures grew strep agalactiae.  On antibiotics as mentioned above.  She was symptomatic last week.  Symptoms have resolved now.  Paroxysmal SVT Appears that she was in sinus tachycardia at the time of admission which was likely due to sepsis.  Continue to watch for now.  Hyponatremia Likely due to hypovolemia.  Sodium levels have improved.   DVT Prophylaxis:  SCDs    Code Status: Full code Family Communication: Discussed with the patient Disposition  Plan: Mobilize.  PT evaluation.    LOS: 3 days   [facility] Pager [phone] 05/23/2018, 12:12 PM  If 7PM-7AM, please contact night-coverage at [email or web address], password [facility]

## 2018-05-23 NOTE — Progress Notes (Signed)
Subjective: POD #1 s/p right hallux amputation with partial closure. She had some reaction to the vancomycin and antibiotics have been held.  She said that she feels well and she denies any systemic complaints including  fevers, chills, nausea, vomiting. No acute changes since last appointment, and no other complaints at this time.   Objective: AAO x3, NAD-sitting on the side of the bed with her foot hanging down. DP/PT pulses palpable bilaterally, CRT less than 3 seconds Bandage intact to the right lower extremity.  Minimal bloody strikethrough on the underlying cause.  Upon removal the sutures are intact and the wound is packed open.  Mild swelling to the surgical site and there is mild erythema along the incision but there is no ascending cellulitis.  Upon removal of the packing there was no purulence only a small amount of bloody drainage occurred.  No fluctuation or crepitation or malodor.  Minimal to no pain to the surgical site. No pain with calf compression, swelling, warmth, erythema        Assessment: POD #1 s/p right hallux amputation  Plan: -All treatment options discussed with the patient including all alternatives, risks, complications.  -Due to the x-ray in the chart.  I did remove 1 of the sutures on the central aspect.  Area was irrigated with saline and packed with a corner of a 4 x 4 moistened gauze dressing followed by an Ace bandage.  Some of the erythema could be from residual infection however also from postsurgical changes.  Due to this I want to continue with IV antibiotics however I discontinue the vancomycin and I added doxycycline to this.  Likely discharge home with 2 weeks of doxycycline.  We will also continue to follow the wound culture but so far negative.  I ordered a cam boot for her.  She can be partial weightbearing in the boot but I do not want her full weightbearing.  She will also need home health care upon discharge.  In order was placed for this today.   Flush the wound with saline packed with quarter inch iodoform packing followed by dry force, Kerlix, Ace bandage.  I encourage elevation at all times.  Right entered the room she was sitting on side of the bed with her foot hanging down.  I will plan on seeing her tomorrow afternoon to change the bandage and hopefully discharge Friday evening or Saturday morning but I want to make sure the swelling and the erythema is improving before discharge.  I will follow-up with her in the office next week on either Monday or Tuesday pending date of discharge I will arrange this for her.  We will continue to follow.  Celesta Gentile, DPM O: [phone] C: [phone] -Patient encouraged to call the office with any questions, concerns, change in symptoms.

## 2018-05-23 NOTE — Telephone Encounter (Signed)
Teena Irani, RN - [facility] states PT is concerned with pt's weightbearing status and applying pressure to the right 1st amputation, and would like recommendations and possible surgery shoe.

## 2018-05-23 NOTE — Progress Notes (Signed)
Patient complaining of itching at IV site while infusing Vanc. IV stopped and MD paged. Received order for one time Benadryl. Administered, will continue to monitor.

## 2018-05-24 LAB — GLUCOSE, CAPILLARY
GLUCOSE-CAPILLARY: 113 mg/dL — AB (ref 70–99)
Glucose-Capillary: 163 mg/dL — ABNORMAL HIGH (ref 70–99)
Glucose-Capillary: 149 mg/dL — ABNORMAL HIGH (ref 70–99)

## 2018-05-24 MED ORDER — OXYCODONE-ACETAMINOPHEN 5-325 MG PO TABS: 1.0000 | ORAL_TABLET | Freq: Four times a day (QID) | ORAL | 0 refills | Status: DC | PRN

## 2018-05-24 MED ORDER — SENNOSIDES-DOCUSATE SODIUM 8.6-50 MG PO TABS: 1.0000 | ORAL_TABLET | Freq: Two times a day (BID) | ORAL | 0 refills | Status: DC

## 2018-05-24 MED ORDER — POLYETHYLENE GLYCOL 3350 17 G PO PACK: 17.0000 g | PACK | Freq: Every day | ORAL | 0 refills | Status: DC | PRN

## 2018-05-24 MED ORDER — ADULT MULTIVITAMIN W/MINERALS CH: 1.0000 | ORAL_TABLET | Freq: Every day | ORAL | 1 refills | Status: DC

## 2018-05-24 MED ORDER — DOXYCYCLINE HYCLATE 100 MG PO TABS: 100.0000 mg | ORAL_TABLET | Freq: Two times a day (BID) | ORAL | 0 refills | Status: AC

## 2018-05-24 NOTE — Progress Notes (Signed)
Pharmacy Antibiotic Note  [name] is a 47 y.o. female admitted on 05/20/2018 with right foot infection and fever.  MRI shows osteomyelitis with necrosis.  Pharmacy has been consulted for cefepime dosing; vancomycin discontinued.  She is also on Flagyl + doxycycline.  Renal function is stable, afebrile, WBC are normal. Plan is to d/c today or tomorrow.  Plan: Continue cefepime 2g IV Q12H Continue Flagyl 500mg  IV Q8H per MD Pharmacy signing off - please re-consult if needed   Height: 5' 7.01" (170.2 cm) Weight: 222 lb 14.2 oz (101.1 kg) IBW/kg (Calculated) : 61.62  Temp (24hrs), Avg:98.9 F (37.2 C), Min:98.1 F (36.7 C), Max:99.7 F (37.6 C)  Recent Labs  Lab 05/20/18 1545 05/20/18 1556 05/20/18 1838 05/21/18 0448 05/22/18 0553 05/23/18 0541  WBC 16.6*  --   --  12.5* 7.9 7.6  CREATININE 0.62  --   --  0.66 0.48 0.59  LATICACIDVEN  --  1.61 1.78  --   --   --     Estimated Creatinine Clearance: 106.2 mL/min (by C-G formula based on SCr of 0.59 mg/dL).    Allergies  Allergen Reactions  . Trulicity [Dulaglutide] Diarrhea and Nausea And Vomiting  . Ciprofloxacin Rash  . Keflex [Cephalexin] Rash  . Latex Rash    Vanc 9/2 >> 9/5 Cefepime 9/2 >> Flagyl 9/2 >> Doxy 9/5 >> Augmentin PTA   9/2 UCx - yeast 9/2 surgical screen - positive 9/2 BCx - NGTD 9/4 right toe wound: ngtd   Renold Genta, PharmD, BCPS Clinical Pharmacist Clinical phone for 05/24/2018 until 3p is [phone] Please check AMION for all Pharmacist numbers by unit 05/24/2018 1:28 PM

## 2018-05-24 NOTE — Care Management Note (Signed)
Case Management Note  Patient Details  Name: [name] MRN: [id number] Date of Birth: Aug 09, 1971  Subjective/Objective:                    Action/Plan: Confirmed face sheet information with patient. Patient plans to discharge today to her sister's home in [place]. She will be home Monday morning 05/27/18 and wants home health to start at that time. Patient aware she will be taught how to change dressing before discharge from hospital.   Patient has a knee walker at home and a standard rolling walker at home.   Patient provided list of home health agencies.   [facility] cannot take referral due to no staff for [facility] is not in network with [place].   [facility] have no no staff for [employer].  [facility] have no staff for [facility] , and not in network with Hyacinth Meeker at [facility] of [facility] accepted referral. Referral faxed [phone].  Patient aware of all of above.  Expected Discharge Date:                  Expected Discharge Plan:  [place]  In-House Referral:     Discharge planning Services  CM Consult  Post Acute Care Choice:  Home Health Choice offered to:  Patient  DME Arranged:  N/A DME Agency:  NA  HH Arranged:  RN [place] Agency:  [facility]  Status of Service:  Completed, signed off  If discussed at [place] of Stay Meetings, dates discussed:    Additional Comments:  Marilu Favre, RN 05/24/2018, 10:25 AM

## 2018-05-24 NOTE — Progress Notes (Signed)
Orthopedic Tech Progress Note Patient Details:  [name] 08/20/1971 [id number]  Ortho Devices Type of Ortho Device: CAM walker Ortho Device/Splint Location: rle Ortho Device/Splint Interventions: Application   Post Interventions Patient Tolerated: Well Instructions Provided: Care of device   [name] 05/24/2018, 8:28 AM

## 2018-05-24 NOTE — Progress Notes (Signed)
Subjective: POD # 2 s/p right hallux amputation with partial closure. She states that she is feeling well and she has no systemic complaints including fevers, chills, nausea, vomiting.  She has no chest pain, shortness of breath or calf pain.  Objective: AAO x3, NAD-sitting on the side of the bed with her foot hanging down. DP/PT pulses palpable bilaterally, CRT less than 3 seconds Bandage intact to the right lower extremity.  Upon removal of the bandage in the packing small amount of bloody drainage is expressed but there is no purulence.  There is decrease edema erythema to the surgical site and there is no ascending cellulitis.  There is no fluctuation or crepitation or any malodor.  There is no tenderness palpation the surgical site as well. No pain with calf compression, swelling, warmth, erythema     Assessment: POD #2 s/p right hallux amputation  Plan: -Overall she is doing better.  I did clean the wound with saline and there is no purulence identified.  There is decrease edema erythema to the surgical site there is no pain.  I repacked the wound with a quarter of a 4 x 4 moistened gauze followed by Betadine over the incision and a dry sterile dressing.  I showed her how to do the dressings at home she feels comfortable doing this.  Home health care has been set up as well.  I want her to continue with elevation and to limit weightbearing but when she does have to put weight on her foot more to the heel.  She has a walker at home.  At this point I think she is okay to be discharged home with 2 weeks of doxycycline.  I will see her in the office on Monday morning for further evaluation.  Agrees with this plan she has no further questions.  Celesta Gentile, DPM

## 2018-05-24 NOTE — Progress Notes (Addendum)
Physical Therapy Treatment & Discharge Patient Details Name: [name] MRN: [id number] DOB: 06-Feb-1971 Today's Date: 05/24/2018    History of Present Illness Patient is a 47 y/o female presenting 05/20/18 with diabetic foot wound with associated fevers, chills. S/p R great toe amputation of R great toe on 9/4. PMH significant for insulin-dependent DM, paroxysmal SVT, anxiety.   PT Comments    Pt progressing well with mobility, although still not concerned about maintaining WB orders. Educ that per podiatry MD note, can be PWB while wearing cam walker boot. Pt still intermittently putting >75% weight through RLE despite cues and education. Indep to don boot while seated. Not interested in practicing with knee scooter. Educ on importance of WB orders and fall risk reduction. Pt has no further questions or concerns. Will d/c acute PT.   Follow Up Recommendations  Home health PT;Supervision - Intermittent     Equipment Recommendations  None recommended by PT    Recommendations for Other Services       Precautions / Restrictions Precautions Precautions: Fall Restrictions Weight Bearing Restrictions: Yes RLE Weight Bearing: Partial weight bearing RLE Partial Weight Bearing Percentage or Pounds: Not specified Other Position/Activity Restrictions: Per podiatry MD note 9/5: "She can be partial weightbearing in the boot"    Mobility  Bed Mobility Overal bed mobility: Independent                Transfers Overall transfer level: Modified independent Equipment used: Rolling walker (2 wheeled) Transfers: Sit to/from Stand              Ambulation/Gait Ambulation/Gait assistance: Supervision Gait Distance (Feet): 200 Feet Assistive device: Rolling walker (2 wheeled) Gait Pattern/deviations: Step-to pattern;Antalgic Gait velocity: Decreased Gait velocity interpretation: 1.31 - 2.62 ft/sec, indicative of limited community ambulator General Gait Details: Pt instructed she  can maintain "PWB" status while wearing cam walker boot, although % not specified by MD, therefore PT recommending <50% PWB. Pt seems to continue placing >75% WB through RLE despite cues/education. Other than that, moving well with cam boot and LLE AFO   Stairs             Wheelchair Mobility    Modified Rankin (Stroke Patients Only)       Balance Overall balance assessment: Needs assistance   Sitting balance-Leahy Scale: Normal Sitting balance - Comments: Indep to don LLE AFO and RLE Cam walker boot while seated EOB     Standing balance-Leahy Scale: Good Standing balance comment: Can static stand and walk without UE support, although putting full WB through RLE                            Cognition Arousal/Alertness: Awake/alert Behavior During Therapy: WFL for tasks assessed/performed Overall Cognitive Status: Within Functional Limits for tasks assessed                                 General Comments: Except for the fact that pt does not seem concerned about WB orders      Exercises      General Comments        Pertinent Vitals/Pain Pain Assessment: Faces Faces Pain Scale: Hurts a little bit Pain Location: R foot Pain Descriptors / Indicators: Aching;Sore Pain Intervention(s): Monitored during session;Limited activity within patient's tolerance    Home Living  Prior Function            PT Goals (current goals can now be found in the care plan section) Acute Rehab PT Goals Patient Stated Goal: return home PT Goal Formulation: With patient Time For Goal Achievement: 06/06/18 Potential to Achieve Goals: Good Progress towards PT goals: Progressing toward goals    Frequency    Min 3X/week      PT Plan Current plan remains appropriate    Co-evaluation              AM-PAC PT "6 Clicks" Daily Activity  Outcome Measure  Difficulty turning over in bed (including adjusting bedclothes,  sheets and blankets)?: None Difficulty moving from lying on back to sitting on the side of the bed? : None Difficulty sitting down on and standing up from a chair with arms (e.g., wheelchair, bedside commode, etc,.)?: A Little Help needed moving to and from a bed to chair (including a wheelchair)?: A Little Help needed walking in hospital room?: A Little Help needed climbing 3-5 steps with a railing? : A Little 6 Click Score: 20    End of Session Equipment Utilized During Treatment: Gait belt Activity Tolerance: Patient tolerated treatment well Patient left: in chair;with call bell/phone within reach Nurse Communication: Mobility status PT Visit Diagnosis: Unsteadiness on feet (R26.81);Other abnormalities of gait and mobility (R26.89);Muscle weakness (generalized) (M62.81)     Time: 1683-7290 PT Time Calculation (min) (ACUTE ONLY): 21 min  Charges:  $Gait Training: 8-22 mins                     Mabeline Caras, PT, DPT Acute Rehabilitation Services  Pager [phone] Office [place] 05/24/2018, 1:19 PM

## 2018-05-24 NOTE — Progress Notes (Signed)
[facility] PROGRESS NOTE  [name] PZW:[id number] DOB: 06-03-1971 DOA: 05/20/2018  PCP: Lavone Orn, MD  Brief History/Interval Summary: 48 y.o.femalewith medical history significant forinsulin-dependent diabetes mellitus, paroxysmal SVT, and anxiety, presented to the emergency department for evaluation of fevers, chills, and increasing redness and swelling of the right foot. Patient has had a diabetic ulcer at the plantar aspect of her right great toe followed by wound care for a year, recently developed redness and swelling of the first toe, and reports that this has continued to worsen despite being on Augmentin prior to admission. MRI raised concern for osteomyelitis of the first distal phalanx.  Podiatry was consulted.  Patient was hospitalized and placed on broad-spectrum antibiotics.  Reason for Visit: Cellulitis and osteomyelitis involving the right first toe  Consultants: Podiatry  Procedures:   Amputation of the right toe 9/4  Lower extremity arterial Dopplers with ABI Within normal limits  Antibiotics: Vancomycin, cefepime and metronidazole was started on admission Vancomycin stopped yesterday due to ?skin reaction Doxycycline was initiated  Subjective/Interval History: Patient states that she feels well.  The rash in her arm after she received vancomycin has resolved.  Pain in the right foot is well controlled.  Not requiring a lot of pain medications.    ROS: Denies any chest pain or shortness of breath.  Objective:  Vital Signs  Vitals:   05/23/18 0513 05/23/18 1256 05/23/18 2129 05/24/18 0639  BP: 110/63 119/72 109/66 121/77  Pulse: 89 (!) 102 96 87  Resp: 18 17    Temp: 98.7 F (37.1 C) 99 F (37.2 C) 99.7 F (37.6 C) 98.1 F (36.7 C)  TempSrc: Oral Oral Oral Oral  SpO2: 97% 97% 95% 98%  Weight:      Height:        Intake/Output Summary (Last 24 hours) at 05/24/2018 1233 Last data filed at 05/24/2018 0900 Gross per 24 hour  Intake  1410.28 ml  Output -  Net 1410.28 ml   Filed Weights   05/20/18 1917 05/20/18 2149 05/22/18 1347  Weight: 108.9 kg 101.1 kg 101.1 kg    General appearance: Awake alert.  In no distress Resp: Normal effort.  Clear to auscultation bilaterally Cardio: S1-S2 is normal regular.  No S3-S4.  No rubs murmurs or bruit GI: Abdomen remains soft.  Nontender nondistended Extremities: Right foot is covered in dressing   Lab Results:  Data Reviewed: I have personally reviewed following labs and imaging studies  CBC: Recent Labs  Lab 05/20/18 1545 05/21/18 0448 05/22/18 0553 05/23/18 0541  WBC 16.6* 12.5* 7.9 7.6  NEUTROABS 14.1* 10.6* 5.7  --   HGB 15.5* 12.4 12.8 12.5  HCT 47.7* 38.7 40.3 38.8  MCV 92.6 92.6 92.4 92.2  PLT 409* 331 350 423    Basic Metabolic Panel: Recent Labs  Lab 05/20/18 1545 05/21/18 0448 05/22/18 0553 05/23/18 0541  NA 136 134* 135 137  K 4.0 3.8 3.5 3.6  CL 98 103 104 104  CO2 '27 24 25 25  ' GLUCOSE 146* 245* 111* 130*  BUN '7 9 6 ' <5*  CREATININE 0.62 0.66 0.48 0.59  CALCIUM 9.4 8.2* 8.6* 8.2*  MG  --   --  2.0  --   PHOS  --   --  3.2  --     GFR: Estimated Creatinine Clearance: 106.2 mL/min (by C-G formula based on SCr of 0.59 mg/dL).  Liver Function Tests: Recent Labs  Lab 05/20/18 1545 05/22/18 0553  AST 15 12*  ALT  15 10  ALKPHOS 99 74  BILITOT 0.9 0.6  PROT 8.0 6.8  ALBUMIN 3.4* 2.6*     CBG: Recent Labs  Lab 05/23/18 0807 05/23/18 1254 05/23/18 1724 05/23/18 2130 05/24/18 0822  GLUCAP 121* 183* 121* 153* 113*     Recent Results (from the past 240 hour(s))  Urine culture     Status: Abnormal   Collection Time: 05/17/18  9:26 AM  Result Value Ref Range Status   Specimen Description URINE, CATHETERIZED  Final   Special Requests NONE  Final   Culture (A)  Final    >=100,000 COLONIES/mL GROUP B STREP(S.AGALACTIAE)ISOLATED TESTING AGAINST S. AGALACTIAE NOT ROUTINELY PERFORMED DUE TO PREDICTABILITY OF AMP/PEN/VAN  SUSCEPTIBILITY. Performed at [facility], [place] [address], [place], [place] [address]    Report Status 05/18/2018 FINAL  Final  Blood culture (routine x 2)     Status: None (Preliminary result)   Collection Time: 05/20/18  6:10 PM  Result Value Ref Range Status   Specimen Description BLOOD BLOOD LEFT FOREARM  Final   Special Requests   Final    BOTTLES DRAWN AEROBIC AND ANAEROBIC Blood Culture adequate volume   Culture   Final    NO GROWTH 3 DAYS Performed at [facility], [place] [address], [place], [place] [address]    Report Status PENDING  Incomplete  Blood culture (routine x 2)     Status: None (Preliminary result)   Collection Time: 05/20/18  6:15 PM  Result Value Ref Range Status   Specimen Description BLOOD RIGHT ARM  Final   Special Requests   Final    BOTTLES DRAWN AEROBIC AND ANAEROBIC Blood Culture results may not be optimal due to an excessive volume of blood received in culture bottles   Culture   Final    NO GROWTH 3 DAYS Performed at [facility], [place] [address], [place], [place] [address]    Report Status PENDING  Incomplete  Urine culture     Status: Abnormal   Collection Time: 05/20/18  7:51 PM  Result Value Ref Range Status   Specimen Description URINE, CATHETERIZED  Final   Special Requests   Final    NONE Performed at [facility], [place] [address], [place], [place] [address]    Culture >=100,000 COLONIES/mL YEAST (A)  Final   Report Status 05/22/2018 FINAL  Final  Surgical pcr screen     Status: Abnormal   Collection Time: 05/21/18  8:55 PM  Result Value Ref Range Status   MRSA, PCR POSITIVE (A) NEGATIVE Final    Comment: RESULT CALLED TO, READ BACK BY AND VERIFIED WITH: Ellwood Handler RN [id number] 05/22/18 A BROWNING    Staphylococcus aureus POSITIVE (A) NEGATIVE Final    Comment: (NOTE) The Xpert SA Assay (FDA approved for NASAL specimens in patients 41 years of age and older), is one component of a comprehensive surveillance program. It is  not intended to diagnose infection nor to guide or monitor treatment. Performed at [facility], [place] [address], [place], [place] [address]   Aerobic/Anaerobic Culture (surgical/deep wound)     Status: None (Preliminary result)   Collection Time: 05/22/18  2:42 PM  Result Value Ref Range Status   Specimen Description WOUND RIGHT TOE GREAT  Final   Special Requests NONE  Final   Gram Stain NO WBC SEEN NO ORGANISMS SEEN   Final   Culture   Final    NO GROWTH 2 DAYS  NO ANAEROBES ISOLATED; CULTURE IN PROGRESS FOR 5 DAYS Performed at [facility], [place] [address] [place], [place] [address]    Report Status PENDING  Incomplete      Radiology Studies: Dg Foot 2 Views Right  Result Date: 05/22/2018 CLINICAL DATA:  Postop check EXAM: RIGHT FOOT - 2 VIEW COMPARISON:  MRI 05/21/2018 FINDINGS: Amputation of the great toe at the first MTP joint without immediate postoperative complicating features identified since recent comparison MRI. Calcaneal enthesopathy is seen along plantar and dorsal aspect. Degenerative spurring is noted across the dorsum of the midfoot. No joint effusion, malalignment nor bone destruction. Forefoot soft tissue swelling is identified. 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 Prominent accessory ossicle adjacent to the tarsal navicular. IMPRESSION: No immediate complications status post amputation of the great toe at the first MTP level. Electronically Signed   By: Ashley Royalty M.D.   On: 05/22/2018 17:56     Medications:  Scheduled: . Chlorhexidine Gluconate Cloth  6 each Topical Daily  . doxycycline  100 mg Oral Q12H  . insulin aspart  0-20 Units Subcutaneous TID WC  . insulin aspart  0-5 Units Subcutaneous QHS  . insulin glargine  90 Units Subcutaneous QHS  . multivitamin with minerals  1 tablet Oral Daily  . mupirocin ointment  1 application Nasal BID  . nutrition supplement (JUVEN)   1 packet Oral BID BM  . sodium chloride flush  3 mL Intravenous Q12H  . verapamil  240 mg Oral Daily   Continuous: . sodium chloride 250 mL (05/24/18 1050)  . ceFEPime (MAXIPIME) IV 2 g (05/24/18 1051)  . lactated ringers 10 mL/hr at 05/22/18 1354  . 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  Long-term ulcer in the right foot.  Failed outpatient treatment with worsening of her infection.  Patient underwent MRI which raised concern for osteomyelitis.  Patient seen by podiatry.  Patient underwent amputation of her right first toe on 9/4.  ESR was 33.  CRP 15.7.  HIV nonreactive.  Patient developed a skin reaction to vancomycin yesterday so this was discontinued.  She was started on doxycycline.  She also remains on cefepime and metronidazole.  Podiatry to determine if patient is ready for discharge today or not.    Insulin-dependent diabetes mellitus HbA1c 8.0.  She is followed by endocrinology as outpatient.  Monitor CBGs.  Continue current regimen for now.  CBGs well controlled.  Recent UTI Urine cultures grew strep agalactiae.  On antibiotics as mentioned above.  She was symptomatic last week.  Symptoms have resolved now.  Paroxysmal SVT Appears that she was in sinus tachycardia at the time of admission which was likely due to sepsis.  Continue to watch for now.  Hyponatremia Likely due to hypovolemia.  Sodium levels have improved.   DVT Prophylaxis: SCDs    Code Status: Full code Family Communication: Discussed with the patient Disposition Plan: Seen by physical therapy.  Home health has been ordered.  Discharge when cleared by podiatry.    LOS:  4 days   [facility] Pager [phone] 05/24/2018, 12:33 PM  If 7PM-7AM, please contact night-coverage at [email or web address], password [facility]

## 2018-05-24 NOTE — Discharge Summary (Signed)
[facility]  Physician Discharge Summary   Patient ID: [name] MRN: [id number] DOB/AGE: 47-08-1971 47 y.o.  Admit date: 05/20/2018 Discharge date: 05/24/2018  PCP: Lavone Orn, MD  DISCHARGE DIAGNOSES:  Sepsis secondary to infected diabetic foot, resolved Osteomyelitis of the right first distal phalanx, status post amputation Insulin-dependent diabetes mellitus   RECOMMENDATIONS FOR OUTPATIENT FOLLOW UP: 1. Close outpatient follow-up with podiatry   DISCHARGE CONDITION: fair  Diet recommendation: Modified carbohydrate  Filed Weights   05/20/18 1917 05/20/18 2149 05/22/18 1347  Weight: 108.9 kg 101.1 kg 101.1 kg    INITIAL HISTORY: 47 y.o.femalewith medical history significant forinsulin-dependent diabetes mellitus, paroxysmal SVT, and anxiety, presented to the emergency department for evaluation of fevers, chills, and increasing redness and swelling of the right foot. Patient has had a diabetic ulcer at the plantar aspect of her right great toe followed by wound care for a year, recently developed redness and swelling of the first toe, and reports that this has continued to worsen despite being on Augmentin prior to admission. MRI raised concern for osteomyelitis of the first distal phalanx.  Podiatry was consulted.  Patient was hospitalized and placed on broad-spectrum antibiotics  Consultants: Podiatry  Procedures:   Amputation of the right toe 9/4  Lower extremity arterial Dopplers with ABI Within normal limits   HOSPITAL COURSE:   Sepsis due to infected diabetic foot ulcer/osteomyelitis of the right first distal phalanx Patient with a known history of diabetes.  Long-term ulcer in the right foot.  Failed outpatient treatment with worsening of her infection.  Patient underwent MRI which raised concern for osteomyelitis.  Patient seen by podiatry.  Patient underwent amputation of her right first toe on 9/4.  ESR was 33.  CRP 15.7.  HIV  nonreactive.  Patient developed a skin reaction to vancomycin so this was discontinued.  She was started on doxycycline.    Cleared by podiatry for discharge today.  They will follow the patient in their office next week.  Cultures have been negative so far.   She will be discharged on doxycycline.  Insulin-dependent diabetes mellitus HbA1c 8.0.  She is followed by endocrinology as outpatient.    Continue with home medication regimen.  Recent UTI Urine cultures grew strep agalactiae.  Symptoms have resolved now.  Paroxysmal SVT Appears that she was in sinus tachycardia at the time of admission which was likely due to sepsis.  No further recurrence.  Hyponatremia Likely due to hypovolemia.  Sodium levels have improved.  Overall stable.  Okay for discharge today.  Cleared by podiatry.     PERTINENT LABS:  The results of significant diagnostics from this hospitalization (including imaging, microbiology, ancillary and laboratory) are listed below for reference.    Microbiology: Recent Results (from the past 240 hour(s))  Urine culture     Status: Abnormal   Collection Time: 05/17/18  9:26 AM  Result Value Ref Range Status   Specimen Description URINE, CATHETERIZED  Final   Special Requests NONE  Final   Culture (A)  Final    >=100,000 COLONIES/mL GROUP B STREP(S.AGALACTIAE)ISOLATED TESTING AGAINST S. AGALACTIAE NOT ROUTINELY PERFORMED DUE TO PREDICTABILITY OF AMP/PEN/VAN SUSCEPTIBILITY. Performed at [facility], [place] [address], [place], [place] [address]    Report Status 05/18/2018 FINAL  Final  Blood culture (routine x 2)     Status: None (Preliminary result)   Collection Time: 05/20/18  6:10 PM  Result Value Ref Range Status   Specimen Description BLOOD BLOOD LEFT FOREARM  Final   Special Requests   Final    BOTTLES DRAWN AEROBIC AND ANAEROBIC Blood Culture adequate volume   Culture   Final    NO GROWTH 4 DAYS Performed at [facility], [address], [place], [place] [address]    Report Status PENDING  Incomplete  Blood culture (routine x 2)     Status: None (Preliminary result)   Collection Time: 05/20/18  6:15 PM  Result Value Ref Range Status   Specimen Description BLOOD RIGHT ARM  Final   Special Requests   Final    BOTTLES DRAWN AEROBIC AND ANAEROBIC Blood Culture results may not be optimal due to an excessive volume of blood received in culture bottles   Culture   Final    NO GROWTH 4 DAYS Performed at [facility], [place] [address], [place], [place] [address]    Report Status PENDING  Incomplete  Urine culture     Status: Abnormal   Collection Time: 05/20/18  7:51 PM  Result Value Ref Range Status   Specimen Description URINE, CATHETERIZED  Final   Special Requests   Final    NONE Performed at [facility], [place] [address] [place], [place] [address]    Culture >=100,000 COLONIES/mL YEAST (A)  Final   Report Status 05/22/2018 FINAL  Final  Surgical pcr screen     Status: Abnormal   Collection Time: 05/21/18  8:55 PM  Result Value Ref Range Status   MRSA, PCR POSITIVE (A) NEGATIVE Final    Comment: RESULT CALLED TO, READ BACK BY AND VERIFIED WITH: Ellwood Handler RN [id number] 05/22/18 A BROWNING    Staphylococcus aureus POSITIVE (A) NEGATIVE Final    Comment: (NOTE) The Xpert SA Assay (FDA approved for NASAL specimens in patients 51 years of age and older), is one component of a comprehensive surveillance program. It is not intended to diagnose infection nor to guide or monitor treatment. Performed at [facility], [place] [address], [place], [place] [address]   Aerobic/Anaerobic Culture (surgical/deep wound)     Status: None (Preliminary result)   Collection Time: 05/22/18  2:42 PM  Result Value Ref Range Status   Specimen Description WOUND RIGHT TOE GREAT  Final   Special Requests NONE  Final   Gram Stain NO WBC SEEN NO ORGANISMS SEEN   Final   Culture   Final    NO GROWTH 2 DAYS NO ANAEROBES ISOLATED; CULTURE  IN PROGRESS FOR 5 DAYS Performed at [facility], [place] [address] [place], [place] [address]    Report Status PENDING  Incomplete     Labs: Basic Metabolic Panel: Recent Labs  Lab 05/20/18 1545 05/21/18 0448 05/22/18 0553 05/23/18 0541  NA 136 134* 135 137  K 4.0 3.8 3.5 3.6  CL 98 103 104 104  CO2 '27 24 25 25  ' GLUCOSE 146* 245* 111* 130*  BUN '7 9 6 ' <5*  CREATININE 0.62 0.66 0.48 0.59  CALCIUM 9.4 8.2* 8.6* 8.2*  MG  --   --  2.0  --   PHOS  --   --  3.2  --    Liver Function Tests: Recent Labs  Lab 05/20/18 1545 05/22/18 0553  AST 15 12*  ALT 15 10  ALKPHOS 99 74  BILITOT 0.9 0.6  PROT 8.0 6.8  ALBUMIN 3.4* 2.6*   CBC: Recent Labs  Lab 05/20/18 1545 05/21/18 0448 05/22/18 0553 05/23/18 0541  WBC 16.6* 12.5* 7.9  7.6  NEUTROABS 14.1* 10.6* 5.7  --   HGB 15.5* 12.4 12.8 12.5  HCT 47.7* 38.7 40.3 38.8  MCV 92.6 92.6 92.4 92.2  PLT 409* 331 350 339    CBG: Recent Labs  Lab 05/23/18 1724 05/23/18 2130 05/24/18 0822 05/24/18 1248 05/24/18 1709  GLUCAP 121* 153* 113* 163* 149*     IMAGING STUDIES Dg Chest 2 View  Result Date: 05/17/2018 CLINICAL DATA:  Left-sided chest pain for 2 days and infected toes, initial encounter EXAM: CHEST - 2 VIEW COMPARISON:  None. FINDINGS: The heart size and mediastinal contours are within normal limits. Both lungs are clear. The visualized skeletal structures are unremarkable. IMPRESSION: No active cardiopulmonary disease. Electronically Signed   By: Inez Catalina M.D.   On: 05/17/2018 09:43   Mr Foot Right W Wo Contrast  Result Date: 05/21/2018 CLINICAL DATA:  Fevers, chills, redness of the right foot EXAM: MRI OF THE RIGHT FOREFOOT WITHOUT AND WITH CONTRAST TECHNIQUE: Multiplanar, multisequence MR imaging of the right forefoot was performed before and after the administration of intravenous contrast. CONTRAST:  25m MULTIHANCE GADOBENATE DIMEGLUMINE 529 MG/ML IV SOLN COMPARISON:  None. FINDINGS:  Bones/Joint/Cartilage Bone marrow edema in the first distal phalanx with cortical irregularity along the plantar medial aspect most consistent with osteomyelitis. Severe soft tissue swelling around the first distal phalanx consistent with cellulitis. 2.4 x 1.5 x 1.7 cm ill-defined area of T2 hyperintensity and T1 hypointense without enhancement area in the plantar aspect of the great toe within the soft tissues with multiple low signal foci within the area of abnormality most concerning for abscess. No other marrow signal abnormality. No fracture or dislocation. Mild osteoarthritis of the first MTP joint. Mild osteoarthritis of the first IP joint. Small first MTP joint effusion. Ligaments Collateral ligaments are intact.  Lisfranc ligament is intact. Muscles and Tendons Muscles are normal.  No muscle atrophy. Soft tissues No other fluid collection or hematoma. Mild just generalized soft tissue swelling along the dorsal aspect of the foot with mild enhancement most concerning for cellulitis. IMPRESSION: 1. Osteomyelitis of the first distal phalanx with severe surrounding cellulitis. Soft tissue necrosis along the plantar aspect of the great toe measuring approximately 2.4 x 1.5 x 1.7 cm with air within the area of necrosis. 2. Cellulitis along the dorsal aspect of the foot. Electronically Signed   By: HKathreen Devoid  On: 05/21/2018 12:16   Dg Foot 2 Views Right  Result Date: 05/22/2018 CLINICAL DATA:  Postop check EXAM: RIGHT FOOT - 2 VIEW COMPARISON:  MRI 05/21/2018 FINDINGS: Amputation of the great toe at the first MTP joint without immediate postoperative complicating features identified since recent comparison MRI. Calcaneal enthesopathy is seen along plantar and dorsal aspect. Degenerative spurring is noted across the dorsum of the midfoot. No joint effusion, malalignment nor bone destruction. Forefoot soft tissue swelling is identified. 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 Prominent accessory ossicle adjacent to the tarsal navicular. IMPRESSION: No immediate complications status post amputation of the great toe at the first MTP level. Electronically Signed   By: DAshley RoyaltyM.D.   On: 05/22/2018 17:56   Dg Foot Complete Right  Result Date: 05/17/2018 Please see detailed radiograph report in office note.  Dg Toe Great Right  Result Date: 05/20/2018 CLINICAL DATA:  Worsening diabetic foot ulcer. EXAM: RIGHT GREAT TOE COMPARISON:  None. FINDINGS: There soft tissue swelling of the right great toe with plantar  soft tissue ulceration noted. Speckled soft tissue emphysema is seen along the plantar aspect of the great toe at the level of the tuft. Subtle osteopenic appearance of the tuft is noted more so along the lateral aspect and the possibility of osteomyelitis is not excluded. No joint dislocation or acute fracture. Mild soft tissue swelling over dorsum of the forefoot is also present. IMPRESSION: 1. Soft tissue swelling of the great toe with plantar soft tissue ulceration. 2. Soft tissue emphysema is identified of the great toe adjacent to the tuft. 3. Subtle osteopenic appearance involving the lateral aspect of the tuft of the great toe is identified. Findings raise suspicion for subtle changes of osteomyelitis. Electronically Signed   By: Ashley Royalty M.D.   On: 05/20/2018 18:49    DISCHARGE EXAMINATION: Vitals:   05/23/18 0513 05/23/18 1256 05/23/18 2129 05/24/18 0639  BP: 110/63 119/72 109/66 121/77  Pulse: 89 (!) 102 96 87  Resp: 18 17    Temp: 98.7 F (37.1 C) 99 F (37.2 C) 99.7 F (37.6 C) 98.1 F (36.7 C)  TempSrc: Oral Oral Oral Oral  SpO2: 97% 97% 95% 98%  Weight:      Height:       General appearance: alert, cooperative, appears stated age and no distress Resp: clear to auscultation bilaterally Cardio: regular rate and rhythm, S1, S2 normal, no murmur, click, rub or gallop GI: soft, non-tender; bowel sounds  normal; no masses,  no organomegaly   DISPOSITION: Home  Discharge Instructions    Call MD for:  difficulty breathing, headache or visual disturbances   Complete by:  As directed    Call MD for:  extreme fatigue   Complete by:  As directed    Call MD for:  persistant dizziness or light-headedness   Complete by:  As directed    Call MD for:  persistant nausea and vomiting   Complete by:  As directed    Call MD for:  severe uncontrolled pain   Complete by:  As directed    Call MD for:  temperature >100.4   Complete by:  As directed    Diet Carb Modified   Complete by:  As directed    Discharge instructions   Complete by:  As directed    Please follow instructions provided by Dr. Jacqualyn Posey.  You were cared for by a hospitalist during your hospital stay. If you have any questions about your discharge medications or the care you received while you were in the hospital after you are discharged, you can call the unit and asked to speak with the hospitalist on call if the hospitalist that took care of you is not available. Once you are discharged, your primary care physician will handle any further medical issues. Please note that NO REFILLS for any discharge medications will be authorized once you are discharged, as it is imperative that you return to your primary care physician (or establish a relationship with a primary care physician if you do not have one) for your aftercare needs so that they can reassess your need for medications and monitor your lab values. If you do not have a primary care physician, you can call [phone] for a physician referral.   Increase activity slowly   Complete by:  As directed         Allergies as of 05/24/2018      Reactions   Trulicity [dulaglutide] Diarrhea, Nausea And Vomiting   Ciprofloxacin Rash   Keflex [cephalexin] Rash  Latex Rash      Medication List    STOP taking these medications   amoxicillin-clavulanate 875-125 MG tablet Commonly known  as:  AUGMENTIN     TAKE these medications   acetaminophen 500 MG tablet Commonly known as:  TYLENOL Take 1,000 mg by mouth every 6 (six) hours as needed for mild pain or headache.   doxycycline 100 MG tablet Commonly known as:  VIBRA-TABS Take 1 tablet (100 mg total) by mouth every 12 (twelve) hours for 14 days.   FEROSUL 325 (65 FE) MG tablet Generic drug:  ferrous sulfate Take 650 mg by mouth daily with breakfast.   hydroxypropyl methylcellulose / hypromellose 2.5 % ophthalmic solution Commonly known as:  ISOPTO TEARS / GONIOVISC Place 1 drop into both eyes 3 (three) times daily as needed for dry eyes.   multivitamin with minerals Tabs tablet Take 1 tablet by mouth daily. Start taking on:  05/25/2018   norethindrone 0.35 MG tablet Commonly known as:  MICRONOR,CAMILA,ERRIN Take 1 tablet by mouth daily.   oxyCODONE-acetaminophen 5-325 MG tablet Commonly known as:  PERCOCET/ROXICET Take 1 tablet by mouth every 6 (six) hours as needed for severe pain. What changed:    when to take this  reasons to take this   polyethylene glycol packet Commonly known as:  MIRALAX / GLYCOLAX Take 17 g by mouth daily as needed for mild constipation.   senna-docusate 8.6-50 MG tablet Commonly known as:  Senokot-S Take 1 tablet by mouth 2 (two) times daily.   silver sulfADIAZINE 1 % cream Commonly known as:  SILVADENE Apply 1 application topically daily. What changed:    when to take this  reasons to take this   TRESIBA FLEXTOUCH 200 UNIT/ML Sopn Generic drug:  Insulin Degludec Inject 180 Units into the skin at bedtime.   verapamil 240 MG CR tablet Commonly known as:  CALAN-SR TAKE 1 TABLET BY MOUTH AT BEDTIME What changed:  when to take this   Vitamin D3 5000 units Caps Take 5,000 Units by mouth daily.   XIGDUO XR 01-999 MG Tb24 Generic drug:  Dapagliflozin-metFORMIN HCl ER Take 2 tablets by mouth daily.        [facility] Follow up.   Specialty:  [place] Why:  home health RN Contact information: [address] [place] [address] [phone]        Trula Slade, DPM Follow up.   Specialty:  Podiatry Contact information: [place] [phone]           TOTAL DISCHARGE TIME: 6 minutes  Bonnielee Haff  [facility] Pager [phone]  05/24/2018, 5:25 PM

## 2018-05-25 ENCOUNTER — Telehealth: Payer: Self-pay | Admitting: Podiatry

## 2018-05-25 LAB — CULTURE, BLOOD (ROUTINE X 2)
CULTURE: NO GROWTH
CULTURE: NO GROWTH
Special Requests: ADEQUATE

## 2018-05-25 NOTE — Telephone Encounter (Signed)
I called [name] to see how she was doing post hospital discharge.  She states that she is doing well she is keeping her foot elevated.  She did not get change the bandage today.  She does have her pain medicine as well as antibiotics at home.  She denies any fevers, chills she has no other concerns today.  She sounded well on the phone.  I will see her Monday morning at 11:00 but I encouraged her to call over the weekend if she has any issues.  Trula Slade

## 2018-05-26 DIAGNOSIS — E119 Type 2 diabetes mellitus without complications: Secondary | ICD-10-CM | POA: Diagnosis not present

## 2018-05-26 DIAGNOSIS — L97511 Non-pressure chronic ulcer of other part of right foot limited to breakdown of skin: Secondary | ICD-10-CM | POA: Diagnosis not present

## 2018-05-26 DIAGNOSIS — I471 Supraventricular tachycardia: Secondary | ICD-10-CM | POA: Diagnosis not present

## 2018-05-26 DIAGNOSIS — I4891 Unspecified atrial fibrillation: Secondary | ICD-10-CM | POA: Diagnosis not present

## 2018-05-26 DIAGNOSIS — M8600 Acute hematogenous osteomyelitis, unspecified site: Secondary | ICD-10-CM

## 2018-05-27 ENCOUNTER — Ambulatory Visit (INDEPENDENT_AMBULATORY_CARE_PROVIDER_SITE_OTHER): Payer: PRIVATE HEALTH INSURANCE | Admitting: Podiatry

## 2018-05-27 DIAGNOSIS — M8600 Acute hematogenous osteomyelitis, unspecified site: Secondary | ICD-10-CM

## 2018-05-27 LAB — AEROBIC/ANAEROBIC CULTURE W GRAM STAIN (SURGICAL/DEEP WOUND): Culture: NO GROWTH

## 2018-05-27 LAB — AEROBIC/ANAEROBIC CULTURE (SURGICAL/DEEP WOUND): GRAM STAIN: NONE SEEN

## 2018-05-28 ENCOUNTER — Telehealth: Payer: Self-pay | Admitting: Podiatry

## 2018-05-28 NOTE — Telephone Encounter (Signed)
I took care of it this morning.

## 2018-05-29 NOTE — Progress Notes (Signed)
Subjective: [name] is a 47 y.o. is seen today in office s/p right hallux amputation preformed on 05/22/2013. They state their pain is controlled and she is not taking any pain medication.  She has been on Paxil any issues.  She did try to elevate her foot as much as possible she is been wearing the surgical boot.. Denies any systemic complaints such as fevers, chills, nausea, vomiting. No calf pain, chest pain, shortness of breath.   Objective: General: No acute distress, AAOx3  DP/PT pulses palpable 2/4, CRT < 3 sec to all digits.  RIGHT foot: Incision is well coapted without any evidence of dehiscence and sutures are intact.  Central aspect still just over the first metatarsal head but it appears to be hard in nature.  There is no purulence.  There is decrease edema erythema to the area compared to what it was when she was discharged from the hospital.  There is no ascending cellulitis.  There is no fluctuation or crepitation or any malodor.  No pain. One on the plantar aspect the left hallux there is to be healed. No other open lesions or pre-ulcerative lesions.  No pain with calf compression, swelling, warmth, erythema.   Assessment and Plan:  Status post right foot surgery, doing well with no complications   -Treatment options discussed including all alternatives, risks, and complications -Wound appears to be healing well.  I irrigated and the saline packed the wound with a small amount of 1/4 inch iodoform packing followed by a midline incisional dry sterile dressing.  Continue with stretches at home and verbal orders were given to the home health agency. -Elevation -Camitta all times and limit activity. -Pain medication as needed. -Monitor for any clinical signs or symptoms of infection and DVT/PE and directed to call the office immediately should any occur or go to the ER. -Follow-up on Friday for dressing change or sooner if any problems arise. In the meantime, encouraged to call  the office with any questions, concerns, change in symptoms.   Celesta Gentile, DPM

## 2018-05-31 ENCOUNTER — Ambulatory Visit (INDEPENDENT_AMBULATORY_CARE_PROVIDER_SITE_OTHER): Payer: PRIVATE HEALTH INSURANCE | Admitting: Podiatry

## 2018-05-31 ENCOUNTER — Other Ambulatory Visit: Payer: PRIVATE HEALTH INSURANCE | Admitting: Orthotics

## 2018-05-31 ENCOUNTER — Encounter: Payer: Self-pay | Admitting: Podiatry

## 2018-05-31 VITALS — Temp 98.1°F

## 2018-05-31 DIAGNOSIS — M8600 Acute hematogenous osteomyelitis, unspecified site: Secondary | ICD-10-CM

## 2018-05-31 DIAGNOSIS — L97511 Non-pressure chronic ulcer of other part of right foot limited to breakdown of skin: Secondary | ICD-10-CM

## 2018-06-03 NOTE — Progress Notes (Signed)
Subjective: [name] is a 47 y.o. is seen today in office s/p right hallux amputation preformed on 05/22/2013.  She presents today for dressing change.  She states the home health nurse has started coming to the house and they have been changing the bandage daily.  He denies any drainage or pus coming from the foot she says the swelling the redness is also getting much better.  Her pain is controlled and she does not require any pain medication. Denies any systemic complaints such as fevers, chills, nausea, vomiting. No calf pain, chest pain, shortness of breath.   Objective: General: No acute distress, AAOx3  DP/PT pulses palpable 2/4, CRT < 3 sec to all digits.  RIGHT foot: Incision is well coapted without any evidence of dehiscence and sutures are intact.  The central aspect of packed open and there started to be some granulation tissue overlying the first metatarsal head.  Small amount of serous drainage is expressed but there is no purulence.  Small amount of fibrotic tissue present within the wound as well.  There is decrease edema and erythema to the surgical site.  There is no warmth of the foot.  Overall infection appears to be improving. No other open lesions or pre-ulcerative lesions.  No pain with calf compression, swelling, warmth, erythema.   Assessment and Plan:  Status post right foot surgery, doing well with no complications   -Treatment options discussed including all alternatives, risks, and complications -Today I did debride the wound utilizing a tissue nipper to remove all fibrotic tissue down to healthy, granular bleeding tissue.  Area was washed with saline and the area was packed with iodoform packing and Betadine over the incision followed by dry sterile dressing.  Continue with daily dressing changes.  Continue the cam boot for now and limit weightbearing.  Encouraged elevation.  Continue with antibiotics.  *X-ray, blood work next appointment.  Order CBC, ESR, CRP,  BMP  Trula Slade DPM

## 2018-06-07 ENCOUNTER — Ambulatory Visit (INDEPENDENT_AMBULATORY_CARE_PROVIDER_SITE_OTHER): Payer: PRIVATE HEALTH INSURANCE | Admitting: Podiatry

## 2018-06-07 ENCOUNTER — Encounter: Payer: Self-pay | Admitting: Podiatry

## 2018-06-07 VITALS — Temp 98.4°F

## 2018-06-07 DIAGNOSIS — M8600 Acute hematogenous osteomyelitis, unspecified site: Secondary | ICD-10-CM

## 2018-06-07 DIAGNOSIS — L97511 Non-pressure chronic ulcer of other part of right foot limited to breakdown of skin: Secondary | ICD-10-CM

## 2018-06-09 NOTE — Progress Notes (Signed)
Subjective: [name] is a 47 y.o. is seen today in office s/p right hallux amputation preformed on 05/22/2013.  She states that overall she is doing better.  She states the swelling the redness is almost completely resolved.  She does change the bandages at home and she also has a home [employer] applications.  She is been trying to be offered but is much as possible.  Her family has been trying to help her more around the house.  She denies any drainage or pus coming from the wound denies any red streaks.Denies any systemic complaints such as fevers, chills, nausea, vomiting. No calf pain, chest pain, shortness of breath.   Objective: General: No acute distress, AAOx3  DP/PT pulses palpable 2/4, CRT < 3 sec to all digits.  RIGHT foot: Incision is well coapted without any evidence of dehiscence and sutures are intact.  The peripheral wound appears to increase in the central aspect along the metatarsal head does not have much tissue coverage.  There is no fluctuation or crepitation.  There is very faint surrounding edema but appears the erythema is almost completely resolved.  There is no ascending cellulitis.  There is no fluctuation or crepitation or any malodor. Wound on the plantar aspect left hallux appears to be healed still.  I did debride the hyperkeratotic lesion today. No other open lesions or pre-ulcerative lesions.  No pain with calf compression, swelling, warmth, erythema.   Assessment and Plan:  Status post right foot surgery, doing well with no complications   -Treatment options discussed including all alternatives, risks, and complications -I did debride the wound down to healthy, bleeding, granular tissue utilizing #312 blade scalpel and tissue nipper.  I did apply single Steri-Strip to help hold the skin edges somewhat together.  The central aspect was packed open after irrigated with saline.  We will continue with dressing changes as well. -Discussed with her return to surgery for wound  debridement possible closure.  Prior to this I do want to get blood work including ESR, CRP, CBC, BMP.  Also get x-ray next appointment.  If everything appears to be normal we will proceed with further surgery to loosely close the wound. -Finish course of antibiotics.  Trula Slade DPM

## 2018-06-14 ENCOUNTER — Ambulatory Visit (INDEPENDENT_AMBULATORY_CARE_PROVIDER_SITE_OTHER): Payer: PRIVATE HEALTH INSURANCE

## 2018-06-14 ENCOUNTER — Ambulatory Visit (INDEPENDENT_AMBULATORY_CARE_PROVIDER_SITE_OTHER): Payer: PRIVATE HEALTH INSURANCE | Admitting: Podiatry

## 2018-06-14 DIAGNOSIS — L97511 Non-pressure chronic ulcer of other part of right foot limited to breakdown of skin: Secondary | ICD-10-CM

## 2018-06-14 DIAGNOSIS — L97514 Non-pressure chronic ulcer of other part of right foot with necrosis of bone: Secondary | ICD-10-CM

## 2018-06-14 NOTE — Patient Instructions (Signed)

## 2018-06-17 ENCOUNTER — Telehealth: Payer: Self-pay | Admitting: *Deleted

## 2018-06-17 MED ORDER — DOXYCYCLINE HYCLATE 100 MG PO TABS: 100.0000 mg | ORAL_TABLET | Freq: Two times a day (BID) | ORAL | 0 refills | Status: DC

## 2018-06-17 NOTE — Telephone Encounter (Signed)
"  We need the patient's most recent culture results sent to [place] as soon as possible.  She said she has a viscous drainage coming out of the wound."     I sent her the requested results.

## 2018-06-17 NOTE — Progress Notes (Signed)
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  She states that overall she is doing well but she states that the wound is about the same.  She has been using the cam boot and nurses recommend how to change the bandage.  She denies any pus coming from the wound she denies any swelling or redness coming from the wound.  We have previously discussed return to the operating room for debridement of the wound and possible wound closure and she will discuss this option today. Denies any systemic complaints such as fevers, chills, nausea, vomiting. No acute changes since last appointment, and no other complaints at this time.   Objective: AAO x3, NAD DP/PT pulses palpable bilaterally, CRT less than 3 seconds There is some fibrotic tissue covering the metatarsal head however I am still able to probe the metatarsal head.  Small amount of yellow-green thick clear drainage is expressed but there is no pus.  There is minimal swelling to the area and there is very faint rim of erythema to the wound likely from inflammation as opposed to infection.  There is no increased warmth of the foot points to get swelling to the foot.  There is no ascending cellulitis.  There is no malodor. No open lesions or pre-ulcerative lesions.  No pain with calf compression, swelling, warmth, erythema  Assessment: Status post hallux amputation with continued ulceration  Plan: -All treatment options discussed with the patient including all alternatives, risks, complications.  -X-rays were obtained and reviewed.  No definitive evidence of acute osteomyelitis identified today to the metatarsal head there is no soft tissue emphysema. -Today I did debride the wound without any complications and did not reveal any evidence of purulence or abscess.  At this point we discussed return to the operating room for resection of the metatarsal head and  possible closure of the wound.  Discussed the procedure as well as alternatives, risks, complications and she wished to proceed with this procedure to try to get the wound to heal.  Although there is no definitive evidence of osteomyelitis on x-ray the wound is been open now for several weeks able to probe the metatarsal head.  There is not been any substantial granulation tissue present to the area.  She understands that she is at increased risk of spreading infection, further amputation. -The incision placement as well as the postoperative course was discussed with the patient. I discussed risks of the surgery which include, but not limited to, infection, bleeding, pain, swelling, need for further surgery, delayed or nonhealing, painful or ugly scar, numbness or sensation changes, over/under correction, recurrence, transfer lesions, further deformity, hardware failure, DVT/PE, loss of toe/foot. Patient understands these risks and wishes to proceed with surgery. The surgical consent was reviewed with the patient all 3 pages were signed. No promises or guarantees were given to the outcome of the procedure. All questions were answered to the best of my ability. Before the surgery the patient was encouraged to call the office if there is any further questions. The surgery will be performed at the [facility] on an outpatient basis. -I debrided the hyperkeratotic tissue of the left hallux today to reveal a small superficial wound measuring 0.3 x 0.2 x 0.1 cm.  Continue daily dressing changes antimetic ointment.  Offloading. -Patient encouraged to call the office with any questions, concerns, change in symptoms.   Trula Slade DPM

## 2018-06-18 ENCOUNTER — Telehealth: Payer: Self-pay | Admitting: *Deleted

## 2018-06-18 NOTE — Telephone Encounter (Signed)
"  I spoke to you last week about her benefits.  I didn't give you the individual benefits.  She has a $1000 individual deductible.  She has met that deductible."

## 2018-06-19 ENCOUNTER — Encounter: Payer: Self-pay | Admitting: Podiatry

## 2018-06-19 DIAGNOSIS — M89371 Hypertrophy of bone, right ankle and foot: Secondary | ICD-10-CM | POA: Diagnosis not present

## 2018-06-20 ENCOUNTER — Telehealth: Payer: Self-pay | Admitting: *Deleted

## 2018-06-20 ENCOUNTER — Telehealth: Payer: Self-pay | Admitting: Podiatry

## 2018-06-20 NOTE — Telephone Encounter (Signed)
Pt called upset and asked to talk to [place]. Marcie Bal was out so pt asked to talk to Lattie Haw or Dr Jacqualyn Posey or leave a message. She said her HR dept has not gotten the [facility] paperwork. I verified with the paperwork from Marcie Bal that she did fax it yesterday at 1643 and it was confirmed. I told pt I would refax it and confirmed the contact name that it was going to and faxed it again and got the confirmation again. I also told pt I had her copies from [place] and she could pick them up tomorrow at her appt.

## 2018-06-20 NOTE — Telephone Encounter (Signed)
Called patient and stated that I was calling to check on patient after having surgery with Dr Jacqualyn Posey and patient stated that she would see [place] at 9:45 am tomorrow. Lattie Haw

## 2018-06-21 ENCOUNTER — Ambulatory Visit: Payer: PRIVATE HEALTH INSURANCE

## 2018-06-21 ENCOUNTER — Ambulatory Visit (INDEPENDENT_AMBULATORY_CARE_PROVIDER_SITE_OTHER): Payer: PRIVATE HEALTH INSURANCE | Admitting: Podiatry

## 2018-06-21 ENCOUNTER — Ambulatory Visit (INDEPENDENT_AMBULATORY_CARE_PROVIDER_SITE_OTHER): Payer: PRIVATE HEALTH INSURANCE

## 2018-06-21 DIAGNOSIS — L97514 Non-pressure chronic ulcer of other part of right foot with necrosis of bone: Secondary | ICD-10-CM | POA: Diagnosis not present

## 2018-06-23 NOTE — Progress Notes (Signed)
Subjective: [name] is a 47 y.o. is seen today in office s/p right metatarsal head excision, wound debridement/excision and wound closure preformed on 06/19/2018.  She took some pain medicine the night of surgery but otherwise she is not having any pain medicine.  She has been in the cam boot entrance the office but is much as possible.  She is on antibiotics.  Denies any systemic complaints such as fevers, chills, nausea, vomiting. No calf pain, chest pain, shortness of breath.   Objective: General: No acute distress, AAOx3  DP/PT pulses palpable 2/4, CRT < 3 sec to all digits.  Protective sensation intact. Motor function intact.  Right foot: Incision is well coapted without any evidence of dehiscence and sutures are intact. T faint erythema more along the inferior portion of the incision.  I think this is more from inflammation as opposed to infection.  There is no signs of ascending cellulitis.  There is mild to moderate edema around the surgical site. There is no pain along the surgical site.  No other areas of tenderness to bilateral lower extremities.  No other open lesions or pre-ulcerative lesions.  No pain with calf compression, swelling, warmth, erythema.   Assessment and Plan:  Status post right foot surgery, doing well with no complications   -Treatment options discussed including all alternatives, risks, and complications -X-rays were obtained reviewed.  Status post first metatarsal head excision.  No evidence of acute fracture. -Betadine wet-to-dry was applied to the incision followed by dry sterile dressing.  She can change it to every other day if needed. -Continue cam boot at all times and encourage elevation -Finish course of antibiotics -Pain medication as needed. -Monitor for any clinical signs or symptoms of infection and DVT/PE and directed to call the office immediately should any occur or go to the ER. -Follow-up on Tuesday or sooner if any problems arise. In the  meantime, encouraged to call the office with any questions, concerns, change in symptoms.   Celesta Gentile, DPM

## 2018-06-25 ENCOUNTER — Ambulatory Visit (INDEPENDENT_AMBULATORY_CARE_PROVIDER_SITE_OTHER): Payer: PRIVATE HEALTH INSURANCE | Admitting: Podiatry

## 2018-06-25 ENCOUNTER — Encounter: Payer: Self-pay | Admitting: Podiatry

## 2018-06-25 VITALS — BP 99/66 | HR 97 | Resp 16

## 2018-06-25 DIAGNOSIS — L97514 Non-pressure chronic ulcer of other part of right foot with necrosis of bone: Secondary | ICD-10-CM

## 2018-06-25 MED ORDER — AMOXICILLIN-POT CLAVULANATE 875-125 MG PO TABS: 1.0000 | ORAL_TABLET | Freq: Two times a day (BID) | ORAL | 0 refills | Status: DC

## 2018-07-02 ENCOUNTER — Ambulatory Visit (INDEPENDENT_AMBULATORY_CARE_PROVIDER_SITE_OTHER): Payer: PRIVATE HEALTH INSURANCE | Admitting: Podiatry

## 2018-07-02 DIAGNOSIS — M8600 Acute hematogenous osteomyelitis, unspecified site: Secondary | ICD-10-CM

## 2018-07-02 DIAGNOSIS — L97514 Non-pressure chronic ulcer of other part of right foot with necrosis of bone: Secondary | ICD-10-CM

## 2018-07-02 NOTE — Progress Notes (Signed)
Subjective: [name] is a 47 y.o. is seen today in office s/p right metatarsal head excision, wound debridement/excision and wound closure preformed on 06/19/2018.  Overall she states that she is doing better.  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  She states the swelling has much improved and she is having no pain.  She still taking antibiotics as directed.  She is tried to limit her weightbearing.  Denies any systemic complaints such as fevers, chills, nausea, vomiting. No calf pain, chest pain, shortness of breath.   Objective: General: No acute distress, AAOx3  DP/PT pulses palpable 2/4, CRT < 3 sec to all digits.  Protective sensation intact. Motor function intact.  Right foot: Incision is well coapted without any evidence of dehiscence and sutures are intact.  There is improved edema to the surgical site and there is very minimal erythema more to the plantar aspect.  There is no fluctuation or crepitation there is no fluid collection identified.  There is no ascending cellulitis.  There is no malodor.  No drainage or pus identified today. No other areas of tenderness to bilateral lower extremities.  No other open lesions or pre-ulcerative lesions.  No pain with calf compression, swelling, warmth, erythema.   Assessment and Plan:  Status post right foot surgery, doing well with no complications   -Treatment options discussed including all alternatives, risks, and complications -Overall she is improving.  Incision was lightly closed and I was able to try to decompress the area to make sure there is no purulence or signs of infection which I was not able to I identified today.  I want her to continue Betadine wet-to-dry to the incision daily.  Continue elevation and limit activity.  Continue cam boot at all times.  Finish course of antibiotics.  Trula Slade DPM

## 2018-07-08 NOTE — Progress Notes (Signed)
Subjective: [name] is a 47 y.o. is seen today in office s/p right metatarsal head excision, wound debridement/excision and wound closure preformed on 06/19/2018.  She states that she is doing better.  She did take one the stitches out herself because it was hurting in a couple of the other ones to come in on her own.  She states that the incision is healing well and is closed.  She is losing some minimal swelling and some minimal redness and she states the redness gets worse with swelling however this is intermittent depending how she is been on it.  She states that she did become more active over the weekend she needs as well as some stitches came out she had some more swelling.  Denies any systemic complaints such as fevers, chills, nausea, vomiting. No calf pain, chest pain, shortness of breath.   Objective: General: No acute distress, AAOx3  DP/PT pulses palpable 2/4, CRT < 3 sec to all digits.  Protective sensation intact. Motor function intact.  Right foot: Incision is well coapted without any evidence of dehiscence and sutures are intact for the most part however couple of the sutures have come loose or out.  Mild swelling to the area.  There is no fluctuation or crepitation there is no fluid collection identified today.  There is no drainage or pus and there is no malodor.  Overall the incision appears to be healing well. Ulceration plantar aspect of the left hallux appears to be healing well and started to scab over.  There is no edema, erythema, drainage or pus left hallux toenail. No other areas of tenderness to bilateral lower extremities.  No other open lesions or pre-ulcerative lesions.  No pain with calf compression, swelling, warmth, erythema.   Assessment and Plan:  Status post right foot surgery, doing well with no complications   -Treatment options discussed including all alternatives, risks, and complications -Continue daily dressing changes in the left great toe.   Sharply debrided and without any complications or bleeding with birth control with scalpel. -Incision is healing well.  Mild swelling to the area but there is no other signs of infection.  Antibiotic ointment is applied followed by dry sterile dressing as well as a compression wrap.  I want her to continue compression and elevation and to limit activity.  -Monitor for any clinical signs or symptoms of infection and directed to call the office immediately should any occur or go to the ER.  Return in about 1 week (around 07/09/2018).  Trula Slade DPM

## 2018-07-09 ENCOUNTER — Encounter: Payer: Self-pay | Admitting: Podiatry

## 2018-07-09 ENCOUNTER — Ambulatory Visit (INDEPENDENT_AMBULATORY_CARE_PROVIDER_SITE_OTHER): Payer: PRIVATE HEALTH INSURANCE

## 2018-07-09 ENCOUNTER — Ambulatory Visit (INDEPENDENT_AMBULATORY_CARE_PROVIDER_SITE_OTHER): Payer: PRIVATE HEALTH INSURANCE | Admitting: Podiatry

## 2018-07-09 DIAGNOSIS — L97514 Non-pressure chronic ulcer of other part of right foot with necrosis of bone: Secondary | ICD-10-CM | POA: Diagnosis not present

## 2018-07-09 DIAGNOSIS — Z89419 Acquired absence of unspecified great toe: Secondary | ICD-10-CM

## 2018-07-09 DIAGNOSIS — L97522 Non-pressure chronic ulcer of other part of left foot with fat layer exposed: Secondary | ICD-10-CM | POA: Diagnosis not present

## 2018-07-09 NOTE — Progress Notes (Signed)
Subjective: ZOE GOONAN is a 47 y.o. is seen today in office s/p right metatarsal head excision, wound debridement/excision and wound closure preformed on 06/19/2018.  She presents today for dressing change.  She states she did drive herself today she started become a little more active otherwise she is doing well.  She states that the incision is healed on the right side she said minimal swelling she denies any redness.  She said no drainage coming from the area.  Also the wound the left big toe is more superficial and she is been keeping Iodosorb gel on the area daily.  Denies any drainage or pus or any swelling or redness.  She has no other concerns. Denies any systemic complaints such as fevers, chills, nausea, vomiting. No calf pain, chest pain, shortness of breath.   Objective: General: No acute distress, AAOx3  DP/PT pulses palpable 2/4, CRT < 3 sec to all digits.  Protective sensation intact. Motor function intact.  Right foot: Incision is well coapted without any evidence of dehiscence and half the sutures are still intact however the underlying wound appears to be almost completely healed the incision is healing well.  There is very minimal edema there is no significant erythema or increase in warmth.  There is no drainage or pus identified.  There is no probing.  No fluctuation or crepitation. Left foot: Plantar aspect left hallux is a hyperkeratotic lesion.  Upon debridement there is a superficial wound measuring 0.2 x 0.2 x 0.1 cm.  There is no probing, undermining or tunneling.  No surrounding edema, erythema there is no ascending cellulitis. No other open lesions are identified.  She did try to cut her toenails and she cut them too short and there is dried blood on the toenail sites. No other open lesions or pre-ulcerative lesions.  No pain with calf compression, swelling, warmth, erythema.   Assessment and Plan:  Status post right foot surgery, doing well with no complications    -Treatment options discussed including all alternatives, risks, and complications -X-rays were obtained reviewed.  No definitive evidence of cortical changes suggestive of osteomyelitis at this time.  No soft tissue emphysema. -I remove the remainder of the sutures on the right foot.  The incision appears to be healing well.  Continue Betadine on the area daily.  Continue cam boot limit activity.  Continue compression elevation as well. -On the left foot I sharply debrided the wound 3.  12 scalpel as well as a tissue nipper without any complications of bleeding down to healthy, granular tissue.  Continue Iodosorb gel.  Offloading all times. -Monitor for any clinical signs or symptoms of infection and directed to call the office immediately should any occur or go to the ER. -RTC 1 week. Will try to get her measured for an L5000 for the right side next appointment  Trula Slade DPM

## 2018-07-18 ENCOUNTER — Ambulatory Visit (INDEPENDENT_AMBULATORY_CARE_PROVIDER_SITE_OTHER): Payer: PRIVATE HEALTH INSURANCE | Admitting: Podiatry

## 2018-07-18 ENCOUNTER — Ambulatory Visit (INDEPENDENT_AMBULATORY_CARE_PROVIDER_SITE_OTHER): Payer: PRIVATE HEALTH INSURANCE

## 2018-07-18 ENCOUNTER — Encounter: Payer: Self-pay | Admitting: Podiatry

## 2018-07-18 ENCOUNTER — Ambulatory Visit (INDEPENDENT_AMBULATORY_CARE_PROVIDER_SITE_OTHER): Payer: PRIVATE HEALTH INSURANCE | Admitting: Orthotics

## 2018-07-18 DIAGNOSIS — M2041 Other hammer toe(s) (acquired), right foot: Secondary | ICD-10-CM

## 2018-07-18 DIAGNOSIS — L97514 Non-pressure chronic ulcer of other part of right foot with necrosis of bone: Secondary | ICD-10-CM | POA: Diagnosis not present

## 2018-07-18 DIAGNOSIS — Z89419 Acquired absence of unspecified great toe: Secondary | ICD-10-CM

## 2018-07-18 DIAGNOSIS — M2042 Other hammer toe(s) (acquired), left foot: Secondary | ICD-10-CM

## 2018-07-18 DIAGNOSIS — E1149 Type 2 diabetes mellitus with other diabetic neurological complication: Secondary | ICD-10-CM

## 2018-07-18 DIAGNOSIS — L97522 Non-pressure chronic ulcer of other part of left foot with fat layer exposed: Secondary | ICD-10-CM | POA: Diagnosis not present

## 2018-07-18 NOTE — Progress Notes (Signed)
Cast for L5000 toe filler Right side, A5514 Left due to hallux amputation.

## 2018-07-18 NOTE — Progress Notes (Signed)
Subjective: [name] is a 47 y.o. is seen today in office s/p right metatarsal head excision, wound debridement/excision and wound closure preformed on 06/19/2018.  The right side has healed. She has returned to a regular shoe and she is going to be seeing Liliane Channel today to get measured for a new insert/toe filler. She did take off the left hallux toenail herself. The wound on the plantar left hallux is getting better.   She is on Augmentin for a sinus infection  Denies any drainage or pus or any swelling or redness.  She has no other concerns. Denies any systemic complaints such as fevers, chills, nausea, vomiting. No calf pain, chest pain, shortness of breath.   Objective: General: No acute distress, AAOx3  DP/PT pulses palpable 2/4, CRT < 3 sec to all digits.  Protective sensation intact. Motor function intact.  Right foot: Incision is well coapted without any evidence of dehiscence and a scar has formed. The incision has healed. There is no surrounding erythema, ascending cellulitis, increase in warmth. The incision is well healed. I am pleased with the way it looks.  On the plantar left hallux is a hyperkeratotic lesion.  Upon debridement a small pinpoint opening present.  Some thicker bloody drainage is identified but there is no purulence.  There is no surrounding erythema, ascending cellulitis.  There is no fluctuation or crepitation or any malodor.  She did remove the left hallux toenail with small pieces remaining which I removed without any bleeding.  Scab is present over the nail bed. No other open lesions or pre-ulcerative lesions No pain with calf compression, swelling, warmth, erythema.   Assessment and Plan:  Status post right foot surgery, doing well with no complications; left hallux ulceration  -Treatment options discussed including all alternatives, risks, and complications -X-rays were obtained reviewed.  No definitive evidence of cortical changes suggestive of  osteomyelitis at this time.  No soft tissue emphysema.  Margins are sharp at the first metatarsal -In regards to the right foot she is doing very well and very pleased with the way it looks.  She was seen by Liliane Channel today to get measured for new diabetic insert.  She is been wearing a regular shoe with no issues but continue to monitor the foot daily.  Elevation. -Regards to the left foot I did sharply debride the hyperkeratotic lesion in the wound without any complications.  No signs of infection otherwise.  She is on Augmentin for sinus infection was will help with this if there is any signs of infection otherwise.  Continue offloading at all times.  Advised her not to trim her toenails or try to the toenails off herself. -Monitor for any clinical signs or symptoms of infection and directed to call the office immediately should any occur or go to the ER.  Trula Slade DPM

## 2018-07-26 ENCOUNTER — Ambulatory Visit (INDEPENDENT_AMBULATORY_CARE_PROVIDER_SITE_OTHER): Payer: PRIVATE HEALTH INSURANCE | Admitting: Podiatry

## 2018-07-26 ENCOUNTER — Encounter: Payer: Self-pay | Admitting: Podiatry

## 2018-07-26 DIAGNOSIS — L97522 Non-pressure chronic ulcer of other part of left foot with fat layer exposed: Secondary | ICD-10-CM

## 2018-07-26 DIAGNOSIS — L97514 Non-pressure chronic ulcer of other part of right foot with necrosis of bone: Secondary | ICD-10-CM

## 2018-07-26 DIAGNOSIS — E1149 Type 2 diabetes mellitus with other diabetic neurological complication: Secondary | ICD-10-CM

## 2018-07-26 DIAGNOSIS — Z89419 Acquired absence of unspecified great toe: Secondary | ICD-10-CM

## 2018-07-30 NOTE — Progress Notes (Signed)
Subjective: [name] is a 47 y.o. is seen today in office s/p right metatarsal head excision, wound debridement/excision and wound closure preformed on 06/19/2018.  She says that overall she is doing well in the right side is healed and she is back to regular shoe with the insert.  Also on the left foot she states that it is getting much better.  She denies any redness or drainage and swelling.  Overall she feels well she has no other concerns.  Denies any drainage or pus or any swelling or redness.  She has no other concerns. Denies any systemic complaints such as fevers, chills, nausea, vomiting. No calf pain, chest pain, shortness of breath.   Objective: General: No acute distress, AAOx3  DP/PT pulses palpable 2/4, CRT < 3 sec to all digits.  Protective sensation intact. Motor function intact.  Right foot: Incision is well coapted without any evidence of dehiscence and a scar has formed.  There is minimal edema there is no erythema increased warmth there is no fluctuation or crepitation or any malodor. Left hallux: On the plantar aspect of the hallux is a hyperkeratotic lesion upon debridement appears that the wound is almost completely healed.  There is no drainage or pus there is no fluctuation or crepitation or any probing, undermining or tunneling.  There is no toenail present the left hallux toenail she is taken off.  No open sore.  Some dried blood is present. No other open lesions or pre-ulcerative lesions No pain with calf compression, swelling, warmth, erythema.   Assessment and Plan:  Status post right foot surgery, doing well with no complications; left hallux ulceration  -Treatment options discussed including all alternatives, risks, and complications -Overall she is doing much better on both feet.  Right side is healed.  Left side she has a hyperkeratotic lesion of the area of the wound and upon debridement the wound is almost completely healed is very superficial any  clinical signs of infection noted.  She has diabetic shoes with shoe fitting well.  Gradually increase activity level.  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  Trula Slade DPM

## 2018-07-31 ENCOUNTER — Telehealth: Payer: Self-pay | Admitting: Podiatry

## 2018-07-31 NOTE — Telephone Encounter (Signed)
Amputated right great toe previously. Pt has been having the left great toe monitored. Wound on left toe. This morning there is bloody/brown drainage and a blood blister next to the wound. No pain, odor or swelling.

## 2018-07-31 NOTE — Telephone Encounter (Signed)
I spoke with pt, asked if she had redness, swelling or streaking, pt denies these symptoms. I asked if she had an ointment to use until we saw her this week. She states she has been using iodosorb given to her by Dr. Jacqualyn Posey. I told her to continue that until seen in office.

## 2018-08-01 ENCOUNTER — Encounter: Payer: Self-pay | Admitting: Podiatry

## 2018-08-01 ENCOUNTER — Ambulatory Visit (INDEPENDENT_AMBULATORY_CARE_PROVIDER_SITE_OTHER): Payer: PRIVATE HEALTH INSURANCE | Admitting: Podiatry

## 2018-08-01 VITALS — BP 121/69 | HR 102 | Temp 99.5°F | Resp 18

## 2018-08-01 DIAGNOSIS — L97522 Non-pressure chronic ulcer of other part of left foot with fat layer exposed: Secondary | ICD-10-CM | POA: Diagnosis not present

## 2018-08-01 DIAGNOSIS — E1149 Type 2 diabetes mellitus with other diabetic neurological complication: Secondary | ICD-10-CM

## 2018-08-03 NOTE — Progress Notes (Signed)
Subjective: This 47 year old female presents the office today for an acute appointment given increased drainage from the left toe ulcer.  She states that she had some bleeding from the area.  She states that she is been more active on her foot and she noticed that open up some.  Denies any pus or any increase in swelling or redness and she states that she has not had any odor or any redness or swelling or red streaks of the foot.  The right foot is doing great.  Denies any systemic complaints such as fevers, chills, nausea, vomiting. No acute changes since last appointment, and no other complaints at this time.   Objective: AAO x3, NAD DP/PT pulses palpable bilaterally, CRT less than 3 seconds On the plantar aspect the right hallux is ulceration on the plantar IPJ there does appear to be a small blister around the area.  After is able to debride the area there is a granular wound base the measures approximate 0.6 x 0.5 cm however is superficial and there is no probing to bone, undermining or tunneling.  There is no surrounding erythema, ascending cellulitis.  There is no fluctuation or crepitation or any malodor.  No open lesions or pre-ulcerative lesions.  No pain with calf compression, swelling, warmth, erythema  Assessment: Left foot ulceration  Plan: -All treatment options discussed with the patient including all alternatives, risks, complications.  -I sharply debrided the wound today utilizing #312 blade scalpel to remove all nonviable and hyperkeratotic tissue down to healthy, granular tissue.  Continue with daily dressing changes as discussed.  Monitoring signs or symptoms of infection.  I had Liliane Channel evaluate her today for the brace and her diabetic shoes.  She thinks that her shoes may be too big. -Patient encouraged to call the office with any questions, concerns, change in symptoms.   Trula Slade DPM

## 2018-08-05 ENCOUNTER — Encounter: Payer: Self-pay | Admitting: Podiatry

## 2018-08-09 ENCOUNTER — Telehealth: Payer: Self-pay | Admitting: Podiatry

## 2018-08-09 ENCOUNTER — Ambulatory Visit (INDEPENDENT_AMBULATORY_CARE_PROVIDER_SITE_OTHER): Payer: PRIVATE HEALTH INSURANCE | Admitting: Podiatry

## 2018-08-09 VITALS — BP 112/73 | HR 97 | Temp 98.5°F

## 2018-08-09 DIAGNOSIS — E1149 Type 2 diabetes mellitus with other diabetic neurological complication: Secondary | ICD-10-CM

## 2018-08-09 DIAGNOSIS — L97521 Non-pressure chronic ulcer of other part of left foot limited to breakdown of skin: Secondary | ICD-10-CM

## 2018-08-09 NOTE — Telephone Encounter (Signed)
Dr. Jacqualyn Posey stated it would be fine for pt to get the flu shot. I informed pt.

## 2018-08-09 NOTE — Telephone Encounter (Signed)
Pt was seen this morning for a wound check. Pt is calling to check and make sure it is okay for her to go ahead and get a flu shot at this time. Please give pt a call.

## 2018-08-09 NOTE — Progress Notes (Signed)
Subjective: 47 year old female presents the office today for follow-up evaluation of a wound on her left big toe.  She says she has been doing well she has not had any drainage or pus coming from the area.  She is returned to work doing desk duty this week and this is been going well.  The right foot is been doing very well she said no problems with this and is been well-healed. Denies any systemic complaints such as fevers, chills, nausea, vomiting. No acute changes since last appointment, and no other complaints at this time.   Objective: AAO x3, NAD DP/PT pulses palpable bilaterally, CRT less than 3 seconds On the plantar aspect the right hallux is ulceration on the plantar aspect of the hallux on the IPJ.  Overall is doing much better measures 0.3 x 0.3 x 0.1 cm.  There is no probing to bone, undermining or tunneling.  No surrounding erythema, ascending cellulitis.  No fluctuation or crepitation or any malodor. 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 No other open lesions No pain with calf compression, swelling, warmth, erythema  Assessment: Left foot ulceration  Plan: -All treatment options discussed with the patient including all alternatives, risks, complications.  -Overall the wound is doing better. I sharply debrided the wound today utilizing #312 blade scalpel to remove all nonviable and hyperkeratotic tissue down to healthy, granular tissue.  Continue with daily dressing changes as discussed.  Monitoring signs or symptoms of infection.   -Continue offloading -Awaiting inserts -Will continue desk duty until December 2nd then can return to rooming for one provider.  -Patient encouraged to call the office with any questions, concerns, change in symptoms.   Trula Slade DPM

## 2018-08-19 ENCOUNTER — Encounter: Payer: Self-pay | Admitting: Podiatry

## 2018-08-19 ENCOUNTER — Ambulatory Visit: Payer: PRIVATE HEALTH INSURANCE | Admitting: Orthotics

## 2018-08-19 ENCOUNTER — Ambulatory Visit (INDEPENDENT_AMBULATORY_CARE_PROVIDER_SITE_OTHER): Payer: PRIVATE HEALTH INSURANCE | Admitting: Podiatry

## 2018-08-19 DIAGNOSIS — M2041 Other hammer toe(s) (acquired), right foot: Secondary | ICD-10-CM

## 2018-08-19 DIAGNOSIS — R2681 Unsteadiness on feet: Secondary | ICD-10-CM

## 2018-08-19 DIAGNOSIS — L97511 Non-pressure chronic ulcer of other part of right foot limited to breakdown of skin: Secondary | ICD-10-CM

## 2018-08-19 DIAGNOSIS — E1149 Type 2 diabetes mellitus with other diabetic neurological complication: Secondary | ICD-10-CM

## 2018-08-19 DIAGNOSIS — L97521 Non-pressure chronic ulcer of other part of left foot limited to breakdown of skin: Secondary | ICD-10-CM | POA: Diagnosis not present

## 2018-08-19 DIAGNOSIS — M2042 Other hammer toe(s) (acquired), left foot: Secondary | ICD-10-CM

## 2018-08-19 NOTE — Progress Notes (Signed)
Subjective: 47 year old female presents the office today for follow-up evaluation of a wound on her left big toe.  She presents today to pick up her orthotics and she also wants me to evaluate the wound the left big toe.  States the wound is doing much better.  She is been keeping meta honey and a Band-Aid on the area daily.  She feels the wound is almost healed.  She does continue to take on her left big toenail to remove the corner of the toenail.  Denies any drainage or pus coming from the area denies any swelling. Denies any systemic complaints such as fevers, chills, nausea, vomiting. No acute changes since last appointment, and no other complaints at this time.   Objective: AAO x3, NAD DP/PT pulses palpable bilaterally, CRT less than 3 seconds On the plantar aspect the right hallux is ulceration on the plantar aspect of the hallux on the IPJ.  The wound has almost healed.  Almost a pinpoint superficial wound present.  Mild hyperkeratotic periwound.  No probing, undermining or tunneling.  No surrounding erythema, ascending cellulitis.  No fluctuation or crepitation malodor. Incision on the right foot is well-healed.  Mild edema to the second toe on the right foot there is no open sore. Hammertoes present No other open lesions No pain with calf compression, swelling, warmth, erythema  Assessment: Left foot ulceration  Plan: -All treatment options discussed with the patient including all alternatives, risks, complications.  -I sharply debrided the left hallux wound without any complications with a #382 blade scalpel reveal a small superficial wound.  Continue medihoney dressing changes daily which I applied today. -Offloading of the right second toe.  Dispensed offloading pads. Liliane Channel dispensed orthotics -RTC 2 weeks or sooner if needed  Trula Slade DPM

## 2018-08-19 NOTE — Progress Notes (Signed)
Patient came in today to pick up custom made foot orthotics.  The goals were accomplished and the patient reported no dissatisfaction with said orthotics.  Patient was advised of breakin period and how to report any issues. 

## 2018-08-30 ENCOUNTER — Encounter: Payer: Self-pay | Admitting: Podiatry

## 2018-08-30 ENCOUNTER — Ambulatory Visit (INDEPENDENT_AMBULATORY_CARE_PROVIDER_SITE_OTHER): Payer: PRIVATE HEALTH INSURANCE | Admitting: Podiatry

## 2018-08-30 VITALS — Temp 98.2°F

## 2018-08-30 DIAGNOSIS — E1149 Type 2 diabetes mellitus with other diabetic neurological complication: Secondary | ICD-10-CM

## 2018-08-30 DIAGNOSIS — L97521 Non-pressure chronic ulcer of other part of left foot limited to breakdown of skin: Secondary | ICD-10-CM | POA: Diagnosis not present

## 2018-09-02 NOTE — Progress Notes (Signed)
Subjective: 47 year old female presents the office today for follow-up evaluation of a wound on her left big toe.  She says overall she is doing well.  She denies any drainage or pus coming from the area for the most part however she did note some small amount of blood coming from the area last night.  Denies any swelling redness or any red streaks.  She has been keeping Medihoney on the wound daily.  Overall she been doing well in the right side.  She is at work on under light duty. Denies any systemic complaints such as fevers, chills, nausea, vomiting. No acute changes since last appointment, and no other complaints at this time.   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  After debridement the wound measures 0.3 x 0.3 x 0.1 cm.  There is no probing, undermining or tunneling.  No surrounding erythema, ascending cellulitis.  There is no fluctuation or crepitation or any malodor coming from the area.  The right side is well-healed.  There is one small area that appears almost like a dry skin area the skin is peeled slightly.  There is no drainage of pus or edema, erythema.  No signs of infection.  No other open lesions No pain with calf compression, swelling, warmth, erythema  Assessment: Left foot ulceration  Plan: -All treatment options discussed with the patient including all alternatives, risks, complications.  -I sharply debrided the left hallux wound without any complications with a #470 blade scalpel reveal a small superficial wound.  Continue medihoney dressing changes daily which I applied today.  Continue with the brace. -The small skin tear on the right side recommend antibiotic ointment dressing changes daily. -Offloading of the right second toe.  Dispensed offloading pads. Liliane Channel dispensed orthotics -RTC 2 weeks or sooner if  needed  Trula Slade DPM

## 2018-09-13 ENCOUNTER — Encounter: Payer: Self-pay | Admitting: Podiatry

## 2018-09-13 ENCOUNTER — Ambulatory Visit (INDEPENDENT_AMBULATORY_CARE_PROVIDER_SITE_OTHER): Payer: PRIVATE HEALTH INSURANCE | Admitting: Podiatry

## 2018-09-13 ENCOUNTER — Ambulatory Visit (INDEPENDENT_AMBULATORY_CARE_PROVIDER_SITE_OTHER): Payer: PRIVATE HEALTH INSURANCE

## 2018-09-13 DIAGNOSIS — L97521 Non-pressure chronic ulcer of other part of left foot limited to breakdown of skin: Secondary | ICD-10-CM

## 2018-09-13 DIAGNOSIS — E1149 Type 2 diabetes mellitus with other diabetic neurological complication: Secondary | ICD-10-CM | POA: Diagnosis not present

## 2018-09-13 DIAGNOSIS — M7989 Other specified soft tissue disorders: Secondary | ICD-10-CM

## 2018-09-13 MED ORDER — AMOXICILLIN-POT CLAVULANATE 875-125 MG PO TABS: 1.0000 | ORAL_TABLET | Freq: Two times a day (BID) | ORAL | 0 refills | Status: DC

## 2018-09-13 MED ORDER — MUPIROCIN 2 % EX OINT: 1.0000 "application " | TOPICAL_OINTMENT | Freq: Two times a day (BID) | CUTANEOUS | 2 refills | Status: DC

## 2018-09-16 ENCOUNTER — Encounter: Payer: Self-pay | Admitting: Podiatry

## 2018-09-16 ENCOUNTER — Telehealth: Payer: Self-pay | Admitting: *Deleted

## 2018-09-16 DIAGNOSIS — L97521 Non-pressure chronic ulcer of other part of left foot limited to breakdown of skin: Secondary | ICD-10-CM | POA: Diagnosis not present

## 2018-09-16 NOTE — Telephone Encounter (Signed)
Pt presents to office with medium Darco shoe and PegAssist PW Series Off-loading Insole, stating the shoe will not stay fastened and the insert is too big. I fitted pt in a small Darco Shoe and cut the PegAssist insole pt had to accommodate the left hallux ulcer and fit in the small Darco shoe. Pt stated that it felt much better.

## 2018-09-17 NOTE — Progress Notes (Signed)
Subjective: 47 year old female presents the office today for follow-up evaluation of a wound in the left foot.  She states that she did start to notice some drainage yesterday and noticed some bleeding.  Denies any pus.  She did smell the drainage this morning she felt there was a small amount of odor coming from the area.  She denies any redness or red streaks.  She has minimal swelling to the toe.  Right side is been doing well. Denies any systemic complaints such as fevers, chills, nausea, vomiting. No acute changes since last appointment, and no other complaints at this time.   Objective: AAO x3, NAD DP/PT pulses palpable bilaterally, CRT less than 3 seconds Ulceration present on the plantar aspect the left hallux.  Does appear to be of a blister that was present around the wound however there is no fluid in the blister today.  I did debride all the nonviable tissue down to healthy, granular tissue.  After debridement today wound measured 0.7 x 0.3 cm.  There is no probing to bone, undermining or tunneling has a depth of approximate 0.2 cm.  Minimal edema to the toe there is no significant erythema or increase in warmth.  No ascending cellulitis.  No fluctuation or crepitation.   Wound on the right side is well-healed  without any open sores.   No open lesions or pre-ulcerative lesions.  No pain with calf compression, swelling, warmth, erythema  Assessment: Left hallux ulceration  Plan: -All treatment options discussed with the patient including all alternatives, risks, complications.  -X-rays were obtained reviewed.  No definitive cortical destruction suggestive of osteomyelitis.  Screw present fifth metatarsal. -She will be debrided the wound without any complications down to healthy, bleeding tissue to remove all nonviable tissue utilizing a 312 with scalpel as well as a tissue nipper.  We are going to start Augmentin.  He needs antibiotic ointment dressing changes daily, mupirocin ointment.   I dispensed a Pegassist for her to offload the wound I want her to get back to wear the surgical shoe. -Monitor for any clinical signs or symptoms of infection and directed to call the office immediately should any occur or go to the ER. -Patient encouraged to call the office with any questions, concerns, change in symptoms.   Trula Slade DPM

## 2018-09-20 ENCOUNTER — Encounter: Payer: Self-pay | Admitting: Podiatry

## 2018-09-20 ENCOUNTER — Ambulatory Visit (INDEPENDENT_AMBULATORY_CARE_PROVIDER_SITE_OTHER): Payer: PRIVATE HEALTH INSURANCE | Admitting: Podiatry

## 2018-09-20 DIAGNOSIS — E1149 Type 2 diabetes mellitus with other diabetic neurological complication: Secondary | ICD-10-CM

## 2018-09-20 DIAGNOSIS — L97521 Non-pressure chronic ulcer of other part of left foot limited to breakdown of skin: Secondary | ICD-10-CM | POA: Diagnosis not present

## 2018-09-25 NOTE — Progress Notes (Signed)
Subjective: 48 year old female presents the office today for follow-up evaluation of a wound to the bottom of her left big toe.  She states that she is doing better she thinks.  She denies any drainage or pus coming from the area.  She has been cleaning the area daily she is been keeping a bandage to the wound.  She is still on Augmentin.  She has been wearing the surgical shoe as well.  Denies any increase in swelling or redness and exudate swelling has improved. Denies any systemic complaints such as fevers, chills, nausea, vomiting. No acute changes since last appointment, and no other complaints at this time.   Objective: AAO x3, NAD DP/PT pulses palpable bilaterally, CRT less than 3 seconds On the plantar aspect the left hallux along the hallux IPJ is a superficial granular wound measuring approximately 0.3 x 0.3 cm and is superficial without any probing, undermining or tunneling.  There is decreased edema to the toe there is no erythema or warmth.  There is no areas of fluctuation or crepitation there is no malodor.  No other wounds are identified at this time. No pain with calf compression, swelling, warmth, erythema  Assessment: 48 year old female with healing wound left hallux  Plan: -All treatment options discussed with the patient including all alternatives, risks, complications.  -I sharply debrided the wound to the any complications or bleeding utilizing tissue nippers with a 312 blade scalpel down to healthy, granular tissue.,  Continue with offloading at all times and I dispensed a new Pegassist for her surgical shoe.  The mupirocin ointment dressing changes daily.  Offloading.  Continue light duty at work including only rooming from one provider and trying to stay off her foot as much as possible. -Patient encouraged to call the office with any questions, concerns, change in symptoms.   Trula Slade DPM

## 2018-09-27 ENCOUNTER — Ambulatory Visit: Payer: PRIVATE HEALTH INSURANCE | Admitting: Podiatry

## 2018-09-30 ENCOUNTER — Ambulatory Visit: Payer: PRIVATE HEALTH INSURANCE | Admitting: Podiatry

## 2018-10-04 ENCOUNTER — Encounter: Payer: Self-pay | Admitting: Podiatry

## 2018-10-04 ENCOUNTER — Ambulatory Visit (INDEPENDENT_AMBULATORY_CARE_PROVIDER_SITE_OTHER): Payer: PRIVATE HEALTH INSURANCE | Admitting: Podiatry

## 2018-10-04 DIAGNOSIS — L97521 Non-pressure chronic ulcer of other part of left foot limited to breakdown of skin: Secondary | ICD-10-CM

## 2018-10-04 DIAGNOSIS — E1149 Type 2 diabetes mellitus with other diabetic neurological complication: Secondary | ICD-10-CM | POA: Diagnosis not present

## 2018-10-08 NOTE — Progress Notes (Signed)
Subjective: 48 year old female presents the office today for follow-up evaluation of a wound to the bottom of her left big toe.  Overall she states that she is doing better.  She has been cleaning with soap and water and she has been using antibiotic ointment dressings daily.  She had a patient toenail left big toe that she peeled off herself as it was loose but denies any drainage or pus coming from the area.  The right side is been doing well and stable and she is wearing a regular shoe on the right side.  Denies any systemic complaints such as fevers, chills, nausea, vomiting. No acute changes since last appointment, and no other complaints at this time.   Objective: AAO x3, NAD DP/PT pulses palpable bilaterally, CRT less than 3 seconds On the plantar aspect the left hallux along the hallux IPJ is a superficial granular wound measuring approximately 0.2 x 0.2 cm and is superficial without any probing, undermining or tunneling.  It appears it is starting to scab over.  There was a piece of nail that he can tell she removed from the left hallux there is minimal redness to this area there is no drainage or pus there is no ascending cellulitis.  No fluctuation or crepitation.   No pain with calf compression, swelling, warmth, erythema  Assessment: 48 year old female with healing wound left hallux with improvement  Plan: -All treatment options discussed with the patient including all alternatives, risks, complications.  -I sharply debrided the wound to the any complications or bleeding utilizing tissue nippers with a 312 blade scalpel down to healthy, granular tissue.  Continue with offloading at all times and I dispensed a new Pegassist for her surgical shoe.  The mupirocin ointment dressing changes daily.  Offloading.  Continue light duty at work including only rooming from one provider and trying to stay off her foot as much as possible. -Advised her not to try to cut pieces of her toenail.  Monitor  for any signs or symptoms of infection. -Patient encouraged to call the office with any questions, concerns, change in symptoms.   Trula Slade DPM

## 2018-10-18 ENCOUNTER — Encounter: Payer: Self-pay | Admitting: Podiatry

## 2018-10-18 ENCOUNTER — Ambulatory Visit (INDEPENDENT_AMBULATORY_CARE_PROVIDER_SITE_OTHER): Payer: PRIVATE HEALTH INSURANCE | Admitting: Podiatry

## 2018-10-18 VITALS — BP 107/66 | HR 104 | Temp 98.9°F | Resp 16

## 2018-10-18 DIAGNOSIS — M2041 Other hammer toe(s) (acquired), right foot: Secondary | ICD-10-CM | POA: Diagnosis not present

## 2018-10-18 DIAGNOSIS — E1149 Type 2 diabetes mellitus with other diabetic neurological complication: Secondary | ICD-10-CM

## 2018-10-18 DIAGNOSIS — M2042 Other hammer toe(s) (acquired), left foot: Secondary | ICD-10-CM

## 2018-10-18 DIAGNOSIS — M216X1 Other acquired deformities of right foot: Secondary | ICD-10-CM

## 2018-10-18 DIAGNOSIS — L97521 Non-pressure chronic ulcer of other part of left foot limited to breakdown of skin: Secondary | ICD-10-CM | POA: Diagnosis not present

## 2018-10-18 NOTE — Progress Notes (Signed)
Subjective: 48 year old female presents the office today for follow-up evaluation of a wound to the bottom of her left big toe.  She states that she has some bloody drainage coming from the wound denies any purulence and denies any increase in swelling or redness to the toe.  She states that the right second toe is been somewhat red from pressure but denies any open sores or any increase in swelling. Denies any systemic complaints such as fevers, chills, nausea, vomiting. No acute changes since last appointment, and no other complaints at this time.   Objective: AAO x3, NAD DP/PT pulses palpable bilaterally, CRT less than 3 seconds On the plantar aspect the left hallux along the hallux IPJ is a superficial granular wound measuring approximately 0.2 x 0.2 cm and is superficial without any probing, undermining or tunneling.  There is starting to scab and upon debridement is about the same but there is no probing, undermining or tunneling.  There is no surrounding erythema, ascending cellulitis.  There is no fluctuation or crepitation malodor.  On the right foot the incision from the surgery site is well-healed.  There is prominent metatarsal head plantarly and hammertoe deformities are present with there is no open sores. No pain with calf compression, swelling, warmth, erythema  Assessment: 48 year old female with healing wound left hallux; pre-ulcerative area right foot  Plan: -All treatment options discussed with the patient including all alternatives, risks, complications.  -I sharply debrided the wound to the any complications or bleeding utilizing tissue nippers with a 312 blade scalpel down to healthy, granular tissue.  Continue with offloading at all times and I dispensed a new Pegassist for her surgical shoe.  Recommend switch back to using iodosorb to the wound daily.  Limit weightbearing continue light duty.  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  -On the right side  I added a metatarsal pad to her insert to help offload the metatarsal heads.  Also dispensed a gel insert.  Discussed the importance of watching this area to make sure there is no area of skin breakdown.    the mupirocin ointment dressing changes daily.  Offloading.  Continue light duty at work including only rooming from one provider and trying to stay off her foot as much as possible. -Advised her not to try to cut pieces of her toenail.  Monitor for any signs or symptoms of infection. -Patient encouraged to call the office with any questions, concerns, change in symptoms.   Trula Slade DPM

## 2018-10-20 ENCOUNTER — Emergency Department (HOSPITAL_COMMUNITY): Payer: No Typology Code available for payment source

## 2018-10-20 ENCOUNTER — Other Ambulatory Visit: Payer: Self-pay

## 2018-10-20 ENCOUNTER — Observation Stay (HOSPITAL_COMMUNITY)
Admission: EM | Admit: 2018-10-20 | Discharge: 2018-10-21 | Disposition: A | Discharge status: Home / Self Care | Payer: No Typology Code available for payment source | Attending: Nephrology | Admitting: Nephrology

## 2018-10-20 DIAGNOSIS — E11319 Type 2 diabetes mellitus with unspecified diabetic retinopathy without macular edema: Secondary | ICD-10-CM | POA: Insufficient documentation

## 2018-10-20 DIAGNOSIS — Z794 Long term (current) use of insulin: Secondary | ICD-10-CM | POA: Insufficient documentation

## 2018-10-20 DIAGNOSIS — M25531 Pain in right wrist: Secondary | ICD-10-CM

## 2018-10-20 DIAGNOSIS — L4050 Arthropathic psoriasis, unspecified: Secondary | ICD-10-CM | POA: Diagnosis present

## 2018-10-20 DIAGNOSIS — Z881 Allergy status to other antibiotic agents status: Secondary | ICD-10-CM | POA: Insufficient documentation

## 2018-10-20 DIAGNOSIS — M19031 Primary osteoarthritis, right wrist: Principal | ICD-10-CM | POA: Insufficient documentation

## 2018-10-20 DIAGNOSIS — E119 Type 2 diabetes mellitus without complications: Secondary | ICD-10-CM

## 2018-10-20 DIAGNOSIS — I480 Paroxysmal atrial fibrillation: Secondary | ICD-10-CM | POA: Diagnosis not present

## 2018-10-20 DIAGNOSIS — Z79899 Other long term (current) drug therapy: Secondary | ICD-10-CM | POA: Diagnosis not present

## 2018-10-20 DIAGNOSIS — I471 Supraventricular tachycardia: Secondary | ICD-10-CM | POA: Diagnosis not present

## 2018-10-20 DIAGNOSIS — M25431 Effusion, right wrist: Secondary | ICD-10-CM | POA: Diagnosis present

## 2018-10-20 DIAGNOSIS — M2550 Pain in unspecified joint: Secondary | ICD-10-CM

## 2018-10-20 LAB — COMPREHENSIVE METABOLIC PANEL
ALT: 19 U/L (ref 0–44)
ANION GAP: 12 (ref 5–15)
AST: 18 U/L (ref 15–41)
Albumin: 3.4 g/dL — ABNORMAL LOW (ref 3.5–5.0)
Alkaline Phosphatase: 60 U/L (ref 38–126)
BILIRUBIN TOTAL: 0.8 mg/dL (ref 0.3–1.2)
BUN: 7 mg/dL (ref 6–20)
CO2: 22 mmol/L (ref 22–32)
Calcium: 8.8 mg/dL — ABNORMAL LOW (ref 8.9–10.3)
Chloride: 103 mmol/L (ref 98–111)
Creatinine, Ser: 0.72 mg/dL (ref 0.44–1.00)
Glucose, Bld: 123 mg/dL — ABNORMAL HIGH (ref 70–99)
Potassium: 3.6 mmol/L (ref 3.5–5.1)
Sodium: 137 mmol/L (ref 135–145)
TOTAL PROTEIN: 7.7 g/dL (ref 6.5–8.1)

## 2018-10-20 LAB — CBC WITH DIFFERENTIAL/PLATELET
ABS IMMATURE GRANULOCYTES: 0.04 10*3/uL (ref 0.00–0.07)
BASOS ABS: 0 10*3/uL (ref 0.0–0.1)
BASOS PCT: 0 %
Eosinophils Absolute: 0.1 10*3/uL (ref 0.0–0.5)
Eosinophils Relative: 1 %
HCT: 46.8 % — ABNORMAL HIGH (ref 36.0–46.0)
HEMOGLOBIN: 14.9 g/dL (ref 12.0–15.0)
Immature Granulocytes: 0 %
Lymphocytes Relative: 14 %
Lymphs Abs: 1.5 10*3/uL (ref 0.7–4.0)
MCH: 28.7 pg (ref 26.0–34.0)
MCHC: 31.8 g/dL (ref 30.0–36.0)
MCV: 90.2 fL (ref 80.0–100.0)
Monocytes Absolute: 0.7 10*3/uL (ref 0.1–1.0)
Monocytes Relative: 6 %
NEUTROS ABS: 8.2 10*3/uL — AB (ref 1.7–7.7)
NEUTROS PCT: 79 %
NRBC: 0 % (ref 0.0–0.2)
PLATELETS: 316 10*3/uL (ref 150–400)
RBC: 5.19 MIL/uL — AB (ref 3.87–5.11)
RDW: 14.4 % (ref 11.5–15.5)
WBC: 10.6 10*3/uL — AB (ref 4.0–10.5)

## 2018-10-20 LAB — URINALYSIS, ROUTINE W REFLEX MICROSCOPIC
BACTERIA UA: NONE SEEN
Bilirubin Urine: NEGATIVE
Ketones, ur: NEGATIVE mg/dL
LEUKOCYTES UA: NEGATIVE
NITRITE: NEGATIVE
PROTEIN: NEGATIVE mg/dL
SPECIFIC GRAVITY, URINE: 1.032 — AB (ref 1.005–1.030)
pH: 5 (ref 5.0–8.0)

## 2018-10-20 LAB — SEDIMENTATION RATE: SED RATE: 9 mm/h (ref 0–22)

## 2018-10-20 LAB — C-REACTIVE PROTEIN: CRP: 3.4 mg/dL — ABNORMAL HIGH (ref <1.0)

## 2018-10-20 MED ORDER — GADOBUTROL 1 MMOL/ML IV SOLN
10.0000 mL | Freq: Once | INTRAVENOUS | Status: AC | PRN
  Administered 2018-10-20: 10 mL via INTRAVENOUS

## 2018-10-20 MED ORDER — ACETAMINOPHEN 500 MG PO TABS
1000.0000 mg | ORAL_TABLET | Freq: Once | ORAL | Status: AC
  Administered 2018-10-20: 1000 mg via ORAL
  Filled 2018-10-20: qty 2

## 2018-10-20 NOTE — ED Notes (Signed)
Patient transported to X-ray 

## 2018-10-20 NOTE — ED Provider Notes (Signed)
[facility] EMERGENCY DEPARTMENT Provider Note   CSN: [id number] Arrival date & time: 10/20/18  1745     History   Chief Complaint Chief Complaint  Patient presents with  . Joint Pain    HPI [name] is a 48 y.o. female.  48yo F w/ PMH including T2DM, psoriatic arthritis, PCOS, A fib, IBS who p/w joint pain. 2 days ago she began having dull, achy b/l pain R>L in shoulders, elbows, and wrists. Pain worse w/ movement and becomes sharp. This morning, she noticed R wrist swelling extending onto hand and a red streak on her wrist that has gradually spread towards elbow. Pain is severe, took ibuprofen 856m without relief. She has associated body aches and chills but no fevers. Mild increase in diarrhea from baseline IBS x 4 days. No URI sx, outdoor exposure, or travel.   She follows with a podiatrist for her L toe chronic ulcer and has been doing well, not currently on antibiotics. Distant h/o osteomyelitis R great toe s/p amputation.  The history is provided by the patient.    Past Medical History:  Diagnosis Date  . Atrial fibrillation ([facility]   . Diabetic retinopathy ([facility]   . Diabetic ulcer of right great toe ([facility]    "been dealing w/it for ~ 1 yr" (05/20/2018)  . Polycystic ovarian syndrome   . Psoriasis   . Psoriatic arthritis ([facility]    "pretty much all over; knees, hands, elbows" (05/20/2018)  . SVT (supraventricular tachycardia) ([facility]    "had 1 chemical conversion here in the ER" (05/20/2018)  . Type II diabetes mellitus ([facility]     Patient Active Problem List   Diagnosis Date Noted  . Sepsis due to cellulitis ([address] 05/20/2018  . UTI (urinary tract infection) 05/20/2018  . Diabetic foot ulcer ([place] 06/22/2017  . Fracture of base of fifth metatarsal bone with routine healing 06/22/2017  . SVT (supraventricular tachycardia) ([facility] 05/14/2014  . Pyelonephritis, acute 10/09/2011  . Insulin-requiring or dependent type II diabetes mellitus ([facility] 12/02/2009    . Polycystic ovaries 12/02/2009  . MIGRAINE HEADACHE 12/02/2009  . IBS 12/02/2009  . PSORIASIS 12/02/2009  . MICROCYTOSIS 12/02/2009    Past Surgical History:  Procedure Laterality Date  . AMPUTATION TOE Right 05/22/2018   Procedure: AMPUTATION RIGHT TOE;  Surgeon: WTrula Slade DPM;  [place] [facility]  Service: Podiatry;  Laterality: Right;  . CESAREAN SECTION  2005  . FOOT FRACTURE SURGERY Left    pin put in by Dr. WJacqualyn Posey . FRACTURE SURGERY    . TUBAL LIGATION  2005     OB History   No obstetric history on file.      Home Medications    Prior to Admission medications   Medication Sig Start Date End Date Taking? Authorizing Provider  acetaminophen (TYLENOL) 500 MG tablet Take 1,000 mg by mouth every 6 (six) hours as needed for mild pain or headache.   Yes [provider]  Cholecalciferol (VITAMIN D3) 5000 units CAPS Take 5,000 Units by mouth daily.   Yes [provider]  Dapagliflozin-metFORMIN HCl ER (XIGDUO XR) 01-999 MG TB24 Take 2 tablets by mouth daily.   Yes [provider]  FEROSUL 325 (65 Fe) MG tablet Take 650 mg by mouth daily with breakfast.  12/04/17  Yes [provider]  ibuprofen (ADVIL,MOTRIN) 200 MG tablet Take 800 mg by mouth daily as needed (pain).   Yes [provider]  insulin glargine (LANTUS) 100 unit/mL SOPN  Inject 160 Units into the skin at bedtime.   Yes [provider]  norethindrone (MICRONOR,CAMILA,ERRIN) 0.35 MG tablet Take 1 tablet by mouth daily.  02/13/18  Yes [provider]  nystatin-triamcinolone (MYCOLOG II) cream Apply 1 application topically daily as needed (rash/irritation).  10/01/18  Yes [provider]  rosuvastatin (CRESTOR) 5 MG tablet Take 5 mg by mouth 3 (three) times a week. 09/14/18  Yes [provider]  Semaglutide,0.25 or 0.5MG/DOS, (OZEMPIC, 0.25 OR 0.5 MG/DOSE,) 2 MG/1.5ML SOPN Inject 0.5 mg into the skin every Tuesday.   Yes [provider]  silver sulfADIAZINE (SILVADENE) 1 % cream Apply 1 application topically daily. Patient taking differently: Apply 1 application topically daily as needed (for irritation).  09/07/17  Yes Trula Slade, DPM  verapamil (CALAN-SR) 240 MG CR tablet TAKE 1 TABLET BY MOUTH AT BEDTIME Patient taking differently: Take 240 mg by mouth daily.  11/25/15  Yes Jettie Booze, MD  Multiple Vitamin (MULTIVITAMIN WITH MINERALS) TABS tablet Take 1 tablet by mouth daily. Patient not taking: Reported on 10/20/2018 05/25/18   Bonnielee Haff, MD  mupirocin ointment (BACTROBAN) 2 % Apply 1 application topically 2 (two) times daily. Patient not taking: Reported on 10/20/2018 09/13/18   Trula Slade, DPM    Family History Family History  Problem Relation Age of Onset  . Hypertension Mother   . Diabetes Mother   . Asthma Mother   . CAD Father   . Diabetes Sister   . Diabetes Brother   . Hypertension Brother     Social History Social History   Tobacco Use  . Smoking status: Never Smoker  . Smokeless tobacco: Never Used  Substance Use Topics  . Alcohol use: Not Currently  . Drug use: Never     Allergies   Trulicity [dulaglutide]; Ciprofloxacin; Keflex [cephalexin]; and Latex   Review of Systems Review of Systems All other systems reviewed and are negative except that which was mentioned in HPI   Physical Exam Updated Vital Signs BP 122/80   Pulse (!) 105   Temp 98.8 F (37.1 C) (Oral)   Resp 18   Ht '5\' 6"'  (1.676 m)   Wt 104.3 kg   SpO2 98%   BMI 37.12 kg/m   Physical Exam Vitals signs and nursing note reviewed.  Constitutional:      General: She is not in acute distress.    Appearance: She is well-developed.  HENT:     Head: Normocephalic and atraumatic.     Nose: Nose normal.     Mouth/Throat:     Mouth: Mucous membranes are moist.     Pharynx: Oropharynx is clear.  Eyes:     Conjunctiva/sclera: Conjunctivae normal.  Neck:     Musculoskeletal:  Neck supple.  Cardiovascular:     Rate and Rhythm: Regular rhythm. Tachycardia present.     Pulses: Normal pulses.     Heart sounds: Normal heart sounds. No murmur.  Pulmonary:     Effort: Pulmonary effort is normal.     Breath sounds: Normal breath sounds.  Abdominal:     General: Bowel sounds are normal. There is no distension.     Palpations: Abdomen is soft.     Tenderness: There is no abdominal tenderness.  Musculoskeletal:        General: Swelling and tenderness present.     Comments: Full ROM shoulders and elbows R wrist w/ edema and mild erythema on ulnar side with red streak extending up central  volar forearm to elbow; limited ROM 2/2 pain and swelling; trace edema extending onto dorsal R hand  Skin:    General: Skin is warm and dry.     Capillary Refill: Capillary refill takes less than 2 seconds.     Comments: Small superficial ulcer on plantar L great toe w/ clean margins, no erythema or drainage  Neurological:     Mental Status: She is alert and oriented to person, place, and time.     Comments: Fluent speech  Psychiatric:        Judgment: Judgment normal.      ED Treatments / Results  Labs (all labs ordered are listed, but only abnormal results are displayed) Labs Reviewed  COMPREHENSIVE METABOLIC PANEL - Abnormal; Notable for the following components:      Result Value   Glucose, Bld 123 (*)    Calcium 8.8 (*)    Albumin 3.4 (*)    All other components within normal limits  CBC WITH DIFFERENTIAL/PLATELET - Abnormal; Notable for the following components:   WBC 10.6 (*)    RBC 5.19 (*)    HCT 46.8 (*)    Neutro Abs 8.2 (*)    All other components within normal limits  C-REACTIVE PROTEIN - Abnormal; Notable for the following components:   CRP 3.4 (*)    All other components within normal limits  URINALYSIS, ROUTINE W REFLEX MICROSCOPIC - Abnormal; Notable for the following components:   APPearance HAZY (*)    Specific Gravity, Urine 1.032 (*)    Glucose,  UA >=500 (*)    Hgb urine dipstick LARGE (*)    All other components within normal limits  CULTURE, BLOOD (ROUTINE X 2)  CULTURE, BLOOD (ROUTINE X 2) W REFLEX TO ID PANEL  SEDIMENTATION RATE    EKG None  Radiology Dg Wrist Complete Right  Result Date: 10/20/2018 CLINICAL DATA:  Right wrist swelling and pain. No injury. History of psoriatic arthritis. EXAM: RIGHT WRIST - COMPLETE 3+ VIEW COMPARISON:  None. FINDINGS: Dorsal soft tissue swelling over the right wrist. Tiny calcific density within the dorsal soft tissues appears well corticated and likely represents vascular or dystrophic calcification. No definite evidence of any acute fracture or dislocation. No expansile or destructive bone lesions. Bone cortex appears intact. IMPRESSION: Soft tissue swelling. No acute bony abnormalities. Electronically Signed   By: Lucienne Capers M.D.   On: 10/20/2018 19:23    Procedures Procedures (including critical care time)  Medications Ordered in ED Medications  acetaminophen (TYLENOL) tablet 1,000 mg (1,000 mg Oral Given 10/20/18 1955)     Initial Impression / Assessment and Plan / ED Course  I have reviewed the triage vital signs and the nursing notes.  Pertinent labs & imaging results that were available during my care of the patient were reviewed by me and considered in my medical decision making (see chart for details).     Non toxic on exam. Has hx of osteomyelitis and DM therefore obtained blood cultures as well as inflammatory markers. DDx for R wrist includes septic arthritis, gout, pseudogout.    Lab work shows reassuring CMP, WBC 10.6, normal ESR, CRP 3.4.  Plain films of wrist are negative for bony process.  I discussed her wrist swelling with hand surgeon on-call, Dr. Percell Miller.  He recommended IR arthrocentesis.  I discussed with interventional radiologist on-call, Dr. Barbie Banner. He recommended MRI wrist to evaluate for infectious process or joint effusion prior to pursuing  arthrocentesis. I have ordered MRI wrist and  am signing out to oncoming provider. Patient has voiced preference of going home w/ close f/u if MRI is unremarkable, as she notes that redness has disappeared and she feels like symptoms may be mildly improved. Dispo pending MRI results and reassessment. Final Clinical Impressions(s) / ED Diagnoses   Final diagnoses:  None    ED Discharge Orders    None       Little, Wenda Overland, MD 10/20/18 2350

## 2018-10-20 NOTE — ED Notes (Signed)
Pt updated on wait time for MRI, 3 pts in front of her

## 2018-10-20 NOTE — ED Notes (Signed)
ED Provider at bedside. 

## 2018-10-20 NOTE — ED Notes (Signed)
Pt asked about wait time for MRI, this RN called MRI and they stated a couple more hours

## 2018-10-20 NOTE — ED Triage Notes (Signed)
Pt reports joint pain bilaterally in shoulders, elbows, and wrist. Pt reports that it is more severe pain on the right side. Pt also states that she has noted some red streaking and swelling on her right wrist that started today. Pt denies any recent injuries.

## 2018-10-21 ENCOUNTER — Encounter (HOSPITAL_COMMUNITY): Payer: Self-pay | Admitting: Orthopedic Surgery

## 2018-10-21 DIAGNOSIS — M25431 Effusion, right wrist: Secondary | ICD-10-CM

## 2018-10-21 DIAGNOSIS — M25531 Pain in right wrist: Secondary | ICD-10-CM | POA: Diagnosis not present

## 2018-10-21 DIAGNOSIS — I480 Paroxysmal atrial fibrillation: Secondary | ICD-10-CM | POA: Diagnosis present

## 2018-10-21 DIAGNOSIS — L4050 Arthropathic psoriasis, unspecified: Secondary | ICD-10-CM

## 2018-10-21 DIAGNOSIS — M779 Enthesopathy, unspecified: Secondary | ICD-10-CM | POA: Diagnosis not present

## 2018-10-21 DIAGNOSIS — L97529 Non-pressure chronic ulcer of other part of left foot with unspecified severity: Secondary | ICD-10-CM | POA: Insufficient documentation

## 2018-10-21 DIAGNOSIS — M659 Synovitis and tenosynovitis, unspecified: Secondary | ICD-10-CM | POA: Diagnosis not present

## 2018-10-21 DIAGNOSIS — E119 Type 2 diabetes mellitus without complications: Secondary | ICD-10-CM

## 2018-10-21 DIAGNOSIS — Z794 Long term (current) use of insulin: Secondary | ICD-10-CM

## 2018-10-21 LAB — CBC WITH DIFFERENTIAL/PLATELET
Abs Immature Granulocytes: 0.05 10*3/uL (ref 0.00–0.07)
Basophils Absolute: 0 10*3/uL (ref 0.0–0.1)
Basophils Relative: 0 %
Eosinophils Absolute: 0.2 10*3/uL (ref 0.0–0.5)
Eosinophils Relative: 2 %
HCT: 43.4 % (ref 36.0–46.0)
Hemoglobin: 14.4 g/dL (ref 12.0–15.0)
IMMATURE GRANULOCYTES: 1 %
LYMPHS PCT: 17 %
Lymphs Abs: 1.6 10*3/uL (ref 0.7–4.0)
MCH: 29.7 pg (ref 26.0–34.0)
MCHC: 33.2 g/dL (ref 30.0–36.0)
MCV: 89.5 fL (ref 80.0–100.0)
Monocytes Absolute: 0.8 10*3/uL (ref 0.1–1.0)
Monocytes Relative: 9 %
Neutro Abs: 6.7 10*3/uL (ref 1.7–7.7)
Neutrophils Relative %: 71 %
Platelets: 307 10*3/uL (ref 150–400)
RBC: 4.85 MIL/uL (ref 3.87–5.11)
RDW: 14.5 % (ref 11.5–15.5)
WBC: 9.4 10*3/uL (ref 4.0–10.5)
nRBC: 0 % (ref 0.0–0.2)

## 2018-10-21 LAB — BASIC METABOLIC PANEL
ANION GAP: 12 (ref 5–15)
BUN: 12 mg/dL (ref 6–20)
CO2: 21 mmol/L — ABNORMAL LOW (ref 22–32)
Calcium: 8.6 mg/dL — ABNORMAL LOW (ref 8.9–10.3)
Chloride: 104 mmol/L (ref 98–111)
Creatinine, Ser: 0.52 mg/dL (ref 0.44–1.00)
GFR calc Af Amer: >60 mL/min (ref >60)
GFR calc non Af Amer: >60 mL/min (ref >60)
Glucose, Bld: 127 mg/dL — ABNORMAL HIGH (ref 70–99)
Potassium: 3.6 mmol/L (ref 3.5–5.1)
Sodium: 137 mmol/L (ref 135–145)

## 2018-10-21 LAB — MRSA PCR SCREENING: MRSA by PCR: NEGATIVE

## 2018-10-21 LAB — GLUCOSE, CAPILLARY
Glucose-Capillary: 115 mg/dL — ABNORMAL HIGH (ref 70–99)
Glucose-Capillary: 135 mg/dL — ABNORMAL HIGH (ref 70–99)
Glucose-Capillary: 165 mg/dL — ABNORMAL HIGH (ref 70–99)

## 2018-10-21 LAB — C-REACTIVE PROTEIN: CRP: 5 mg/dL — ABNORMAL HIGH (ref <1.0)

## 2018-10-21 MED ORDER — ONDANSETRON HCL 4 MG PO TABS: 4.0000 mg | ORAL_TABLET | Freq: Four times a day (QID) | ORAL | Status: DC | PRN

## 2018-10-21 MED ORDER — POLYETHYLENE GLYCOL 3350 17 G PO PACK: 17.0000 g | PACK | Freq: Every day | ORAL | Status: DC | PRN

## 2018-10-21 MED ORDER — MORPHINE SULFATE (PF) 4 MG/ML IV SOLN: 3.0000 mg | INTRAVENOUS | Status: DC | PRN

## 2018-10-21 MED ORDER — ACETAMINOPHEN 325 MG PO TABS
650.0000 mg | ORAL_TABLET | Freq: Four times a day (QID) | ORAL | Status: DC | PRN
  Administered 2018-10-21 (×2): 650 mg via ORAL
  Filled 2018-10-21 (×2): qty 2

## 2018-10-21 MED ORDER — INSULIN GLARGINE 100 UNIT/ML ~~LOC~~ SOLN: 80.0000 [IU] | Freq: Every day | SUBCUTANEOUS | Status: DC

## 2018-10-21 MED ORDER — VERAPAMIL HCL ER 240 MG PO TBCR: 240.0000 mg | EXTENDED_RELEASE_TABLET | Freq: Every day | ORAL | Status: DC

## 2018-10-21 MED ORDER — ACETAMINOPHEN 650 MG RE SUPP: 650.0000 mg | Freq: Four times a day (QID) | RECTAL | Status: DC | PRN

## 2018-10-21 MED ORDER — SODIUM CHLORIDE 0.9 % IV SOLN
INTRAVENOUS | Status: DC
  Administered 2018-10-21: 04:00:00 via INTRAVENOUS

## 2018-10-21 MED ORDER — ROSUVASTATIN CALCIUM 5 MG PO TABS
5.0000 mg | ORAL_TABLET | ORAL | Status: DC
  Administered 2018-10-21: 5 mg via ORAL
  Filled 2018-10-21: qty 1

## 2018-10-21 MED ORDER — ONDANSETRON HCL 4 MG/2ML IJ SOLN: 4.0000 mg | Freq: Four times a day (QID) | INTRAMUSCULAR | Status: DC | PRN

## 2018-10-21 MED ORDER — INSULIN ASPART 100 UNIT/ML ~~LOC~~ SOLN
0.0000 [IU] | SUBCUTANEOUS | Status: DC
  Administered 2018-10-21: 3 [IU] via SUBCUTANEOUS
  Administered 2018-10-21: 2 [IU] via SUBCUTANEOUS

## 2018-10-21 MED ORDER — HYDROCODONE-ACETAMINOPHEN 5-325 MG PO TABS: 1.0000 | ORAL_TABLET | ORAL | Status: DC | PRN

## 2018-10-21 NOTE — Consult Note (Signed)
ORTHOPAEDIC CONSULTATION  REQUESTING PHYSICIAN: Roney Jaffe, MD  Chief Complaint: Right wrist pain/swelling  HPI: [name] is a 48 y.o. female who complains of recent polyarthralgia with more significant pain and swelling in her right wrist accompanied by some erythema yesterday.  She took 800 mg ibuprofen prior to arrival in the emergency department.  WBC, ESR normal.  CRP mildly elevated: 3.4.  She has denied fevers or systemic symptoms.  No injury.  MRI of the right wrist was obtained and shows soft tissue edema along the distal ulna,  tenosynovitis of the flexor digitus profundus tendon, small radiocarpal and diffuse radial ulnar joint effusions, and mild tendinosis of the extensor carpi ulnaris tendon with possible sub-sheath injury.  Orthopedics was consulted for evaluation.  Today the patient feels significantly better.  Is not want nor feel the need for other interventions.  Be able to eat/drink at home.  She denies pain with movement of the wrist, hand, or fingers.    Past Medical History:  Diagnosis Date  . Atrial fibrillation ([place])   . Diabetic retinopathy ([place])   . Diabetic ulcer of right great toe [facility])    "been dealing w/it for ~ 1 yr" (05/20/2018)  . Polycystic ovarian syndrome   . Psoriasis   . Psoriatic arthritis ([place])    "pretty much all over; knees, hands, elbows" (05/20/2018)  . SVT (supraventricular tachycardia) ([place])    "had 1 chemical conversion here in the ER" (05/20/2018)  . Type II diabetes mellitus ([place])    Past Surgical History:  Procedure Laterality Date  . AMPUTATION TOE Right 05/22/2018   Procedure: AMPUTATION RIGHT TOE;  Surgeon: Trula Slade, DPM;  Location: [facility];  Service: Podiatry;  Laterality: Right;  . CESAREAN SECTION  2005  . FOOT FRACTURE SURGERY Left    pin put in by Dr. Jacqualyn Posey  . FRACTURE SURGERY    . TUBAL LIGATION  2005   Social History   Socioeconomic History  . Marital status: Married    Spouse name: Not on  file  . Number of children: Not on file  . Years of education: Not on file  . Highest education level: Not on file  Occupational History  . Not on file  Social Needs  . Financial resource strain: Not on file  . Food insecurity:    Worry: Not on file    Inability: Not on file  . Transportation needs:    Medical: Not on file    Non-medical: Not on file  Tobacco Use  . Smoking status: Never Smoker  . Smokeless tobacco: Never Used  Substance and Sexual Activity  . Alcohol use: Not Currently  . Drug use: Never  . Sexual activity: Yes  Lifestyle  . Physical activity:    Days per week: Not on file    Minutes per session: Not on file  . Stress: Not on file  Relationships  . Social connections:    Talks on phone: Not on file    Gets together: Not on file    Attends religious service: Not on file    Active member of club or organization: Not on file    Attends meetings of clubs or organizations: Not on file    Relationship status: Not on file  Other Topics Concern  . Not on file  Social History Narrative  . Not on file   Family History  Problem Relation Age of Onset  . Hypertension Mother   . Diabetes Mother   .  Asthma Mother   . CAD Father   . Diabetes Sister   . Diabetes Brother   . Hypertension Brother    Allergies  Allergen Reactions  . Trulicity [Dulaglutide] Diarrhea and Nausea And Vomiting  . Ciprofloxacin Rash  . Keflex [Cephalexin] Rash  . Latex Rash   Prior to Admission medications   Medication Sig Start Date End Date Taking? Authorizing Provider  acetaminophen (TYLENOL) 500 MG tablet Take 1,000 mg by mouth every 6 (six) hours as needed for mild pain or headache.   Yes [provider]  Cholecalciferol (VITAMIN D3) 5000 units CAPS Take 5,000 Units by mouth daily.   Yes [provider]  Dapagliflozin-metFORMIN HCl ER (XIGDUO XR) 01-999 MG TB24 Take 2 tablets by mouth daily.   Yes [provider]  FEROSUL 325 (65 Fe) MG tablet Take  650 mg by mouth daily with breakfast.  12/04/17  Yes [provider]  ibuprofen (ADVIL,MOTRIN) 200 MG tablet Take 800 mg by mouth daily as needed (pain).   Yes [provider]  insulin glargine (LANTUS) 100 unit/mL SOPN Inject 160 Units into the skin at bedtime.   Yes [provider]  norethindrone (MICRONOR,CAMILA,ERRIN) 0.35 MG tablet Take 1 tablet by mouth daily.  02/13/18  Yes [provider]  nystatin-triamcinolone (MYCOLOG II) cream Apply 1 application topically daily as needed (rash/irritation).  10/01/18  Yes [provider]  rosuvastatin (CRESTOR) 5 MG tablet Take 5 mg by mouth 3 (three) times a week. 09/14/18  Yes [provider]  Semaglutide,0.25 or 0.5MG/DOS, (OZEMPIC, 0.25 OR 0.5 MG/DOSE,) 2 MG/1.5ML SOPN Inject 0.5 mg into the skin every Tuesday.   Yes [provider]  silver sulfADIAZINE (SILVADENE) 1 % cream Apply 1 application topically daily. Patient taking differently: Apply 1 application topically daily as needed (for irritation).  09/07/17  Yes Trula Slade, DPM  verapamil (CALAN-SR) 240 MG CR tablet TAKE 1 TABLET BY MOUTH AT BEDTIME Patient taking differently: Take 240 mg by mouth daily.  11/25/15  Yes Jettie Booze, MD  Multiple Vitamin (MULTIVITAMIN WITH MINERALS) TABS tablet Take 1 tablet by mouth daily. Patient not taking: Reported on 10/20/2018 05/25/18   Bonnielee Haff, MD  mupirocin ointment (BACTROBAN) 2 % Apply 1 application topically 2 (two) times daily. Patient not taking: Reported on 10/20/2018 09/13/18   Trula Slade, DPM   Dg Wrist Complete Right  Result Date: 10/20/2018 CLINICAL DATA:  Right wrist swelling and pain. No injury. History of psoriatic arthritis. EXAM: RIGHT WRIST - COMPLETE 3+ VIEW COMPARISON:  None. FINDINGS: Dorsal soft tissue swelling over the right wrist. Tiny calcific density within the dorsal soft tissues appears well corticated and likely represents vascular or dystrophic  calcification. No definite evidence of any acute fracture or dislocation. No expansile or destructive bone lesions. Bone cortex appears intact. IMPRESSION: Soft tissue swelling. No acute bony abnormalities. Electronically Signed   By: Lucienne Capers M.D.   On: 10/20/2018 19:23   Mr Wrist Right W Wo Contrast  Result Date: 10/21/2018 CLINICAL DATA:  Right wrist with edema and mild erythema on ulnar side with red streak extending up central volar forearm to elbow. Limited ROM. EXAM: MR OF THE RIGHT WRIST WITHOUT AND WITH CONTRAST TECHNIQUE: Multiplanar multisequence MR imaging of the right wrist was performed both before and after the administration of intravenous contrast. CONTRAST:  10 mL Gadavist COMPARISON:  None. FINDINGS: Ligaments: Intact scapholunate and lunotriquetral ligaments. Triangular fibrocartilage: Intact TFCC. Tendons: Mild tendinosis of the extensor carpi  ulnaris tendon with peripheral subluxation concerning for subsheath injury. Remainder of the extensor tendons are intact . Intact flexor compartment tendons. Tenosynovitis of the flexor digitorum profundus tendons Carpal tunnel/median nerve: Normal carpal tunnel. Normal median nerve. Guyon's canal: Normal. Joint/cartilage: Small joint effusion of the radiocarpal joint and distal radioulnar joint with synovitis. No focal chondral defect. Bones/carpal alignment: No acute osseous abnormality. No aggressive osseous lesion. Normal alignment. Other: Soft tissue edema overlying the distal ulna with enhancement concerning for cellulitis. No drainable fluid collection. IMPRESSION: 1. Soft tissue edema overlying the distal ulna with enhancement concerning for cellulitis. 2. Tenosynovitis of the flexor digitorum profundus tendons at the level of the carpal tunnel which may be secondary to an infectious or inflammatory etiology. Small amount Perifascial fluid along the flexor digitorum profundus muscle. 3. Small radiocarpal and distal radioulnar joint  effusion with synovitis secondary to an infectious or inflammatory etiology. 4. Mild tendinosis of the extensor carpi ulnaris tendon with peripheral subluxation concerning for subsheath injury. Electronically Signed   By: Kathreen Devoid   On: 10/21/2018 00:40    Positive ROS: All other systems have been reviewed and were otherwise negative with the exception of those mentioned in the HPI and as above.  Objective: Labs cbc Recent Labs    10/20/18 1900 10/21/18 0349  WBC 10.6* 9.4  HGB 14.9 14.4  HCT 46.8* 43.4  PLT 316 307    Labs inflam Recent Labs    10/20/18 1900 10/21/18 0349  CRP 3.4* 5.0*    Labs coag No results for input(s): INR, PTT in the last 72 hours.  Invalid input(s): PT  Recent Labs    10/20/18 1900 10/21/18 0349  NA 137 137  K 3.6 3.6  CL 103 104  CO2 22 21*  GLUCOSE 123* 127*  BUN 7 12  CREATININE 0.72 0.52  CALCIUM 8.8* 8.6*    Physical Exam: Vitals:   10/20/18 1845 10/21/18 0215  BP: 122/80 (!) 107/53  Pulse: (!) 105 88  Resp: 18 16  Temp:    SpO2: 98% 96%   General: Alert, no acute distress.  Upright in bed. Mental status: Alert and Oriented x3 Neurologic: Speech Clear and organized, no gross focal findings or movement disorder appreciated. Respiratory: No cyanosis, no use of accessory musculature Cardiovascular: No pedal edema GI: Abdomen is soft and non-tender, non-distended. Skin: Warm and dry.  No lesions in the area of chief complaint. Extremities: Warm and well perfused w/o edema Psychiatric: Patient is competent for consent with normal mood and affect  MUSCULOSKELETAL:  RUE: Full active and passive motion the right wrist, hand, and fingers.  Denies pain with resisted motion.  Faint redness around the wrist Ace wrap is removed, but there is no tenderness, induration, pain with palpation.  She is neurovascularly intact.  No lesions Other extremities are atraumatic with painless ROM and NVI.  Assessment / Plan: Principal  Problem:   Pain and swelling of right wrist Active Problems:   Insulin-requiring or dependent type II diabetes mellitus ([facility])   Psoriatic arthritis ([facility])   AF (paroxysmal atrial fibrillation) ([facility])   Right wrist pain/swelling  Significantly improved overnight.  Patient hoping to go home this morning.  She does not want nor feel the need for aspiration.  This appears to be tendinitis/tenosynovitis and inflammation secondary to her psoriatic disease.  Full painless active and passive motion in the wrist.  Minimal if any redness/swelling this morning.  -Recommend removable wrist brace for comfort -Tylenol, NSAIDs as needed inflammation -Follow-up  with Dr. Lenna Gilford, rheumatology -If fever/systemic symptoms develop and or previous signs/symptoms return, would recommend IR aspiration to rule out infection. -Okay for diet  I discussed above including possibility of infection with the patient who verbalized understanding and agrees with plan.  Contact information:  Edmonia Lynch MD, Roxan Hockey PA-C  Prudencio Burly III PA-C 10/21/2018 7:20 AM

## 2018-10-21 NOTE — Discharge Summary (Signed)
Physician Discharge Summary  Patient ID: [name] MRN: [id number] DOB/AGE: 1970/10/12 48 y.o.  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  Patient reports that she had previously been on immunosuppressant medications for her psoriatic arthritis, these were stopped 5- 6 mos ago when she was being treated for the diabetic toe infection, and she has remained off of these medications.  She reports approximately 3 days of dull and aching pain in the bilateral shoulders, elbows, and wrists, similar to her prior experiences with psoriatic arthritis, but has also noticed increasing right wrist swelling and redness over the past day with streaking of erythema proximally towards the elbow.  She took 800 mg of Advil at home without any significant relief and now presents for evaluation. She denies any fevers associated with this.  She denies any trauma or precipitating event.  ED Course: Upon arrival to the ED, patient is found to  be afebrile, saturating well on room air, slightly tachycardic, and with stable blood pressure.  Radiographs of the right wrist reveal soft tissue swelling without bony abnormality.  Chemistry panel is unremarkable and CBC notable for slight leukocytosis.  ESR is normal and CRP elevated to 3.4.  Urinalysis is notable for glucosuria, microscopic hematuria, and elevated specific gravity.  MRI of the wrist was obtained and again demonstrates soft tissue edema along the distal ulna with enhancement concerning for cellulitis, as well as tenosynovitis of the flexor digitus profundus tendon at the level of the carpal tunnel, small radiocarpal and diffuse radial ulnar joint effusions, and mild tendinosis of the extensor carpi ulnaris tendon with possible sub-sheath injury.  IR was consulted by the ED physician and recommended day orthopedic consultation.  Orthopedic surgery was consulted, recommended medical admission, recommended holding antibiotics for now, and they will see her in the morning.    Hospital Course:  1. Right wrist pain and swelling: pt presented w/ polyarthralgia similar to prior flares of psoriatic arthritis, but different in that the right wrist became increasingly swollen and tender with erythema streaking proximally. She took 800 mg ibuprofen prior to arrival and is afebrile here  - slight leukocytosis, normal ESR, CRP elevated to 3.4  - MRI with suspected cellulitis as well as joint effusions and tenosynovitis that could be infectious or inflammatory - blood cx's are negative at 12 hours - patient didn't receive any antibiotics or steroids here - Orthopedic surgery was consulted and notes today hand is much better, they felt this was c/w tendinitis/ tenosynovitis and inflammation due to her psoriatic arthritis. OK for dc , she can take nsaids/ tylenol and should f/u with her rheumatologist.  - if fever or symptoms develop she  would need joint aspiration per IR   2. Insulin-dependent DM   - A1c was 8.0% in September  - cont home meds upon dc (Ozempic, dapagliflozin-metformin, and Lantus 160 units qHS)   3. Paroxysmal atrial fibrillation  - She is in a regular rhythm on admission  - CHADS-VASc is 2 (gender, DM) and she is not anticoagulated  - Continue verapamil     Consults: orthopedics/ hand  Significant Diagnostic Studies: MRI wrist  Discharge Exam: Blood pressure (!) 107/53, pulse 88, temperature 98.8 F (37.1 C), temperature source Oral, resp. rate 16, height _0  (1.676 m), weight 104.3 kg, SpO2 96 %. Constitutional: NAD, calm  Eyes: PERTLA, lids and conjunctivae normal ENMT: Mucous membranes are moist. Posterior pharynx clear of any exudate or lesions.   Neck: normal, supple, no masses, no thyromegaly Respiratory: clear to auscultation bilaterally, no wheezing, no crackles. Normal respiratory effort.   Cardiovascular: S1 & S2 heard, regular rate and rhythm. No extremity edema.   Abdomen: No distension, no tenderness, soft. Bowel sounds active.  Musculoskeletal: no clubbing / cyanosis. Right wrist tenderness is better and R wrist erythema is > 50% better per the pt and pain is almost improved. No fluctuance or drainage.     Skin: Right wrist findings above. Otherwise, no significant rashes, lesions, ulcers. Warm, dry, well-perfused. Neurologic: No facial asymmetry. Sensation intact. Moving all extremities.  Psychiatric:  Alert and oriented x 3. Calm, copperative.   Disposition: Discharge disposition: 01-Home or Self Care       Allergies as of 10/21/2018      Reactions   Trulicity [dulaglutide] Diarrhea, Nausea And Vomiting   Ciprofloxacin Rash   Keflex [cephalexin] Rash   Latex Rash      Medication List    TAKE these medications   acetaminophen 500 MG tablet Commonly known as:  TYLENOL Take 1,000 mg by mouth every 6 (six) hours as needed for mild pain or headache.   FEROSUL 325 (65 FE) MG tablet Generic drug:  ferrous sulfate Take 650 mg by  mouth daily with breakfast.   ibuprofen 200 MG tablet Commonly known as:  ADVIL,MOTRIN Take 800 mg by mouth daily as needed (pain).   insulin glargine 100 unit/mL Sopn Commonly known as:  LANTUS Inject 160 Units into the skin at bedtime.   multivitamin with minerals Tabs tablet Take 1 tablet by mouth daily.   mupirocin ointment 2 % Commonly known as:  BACTROBAN Apply 1 application topically 2 (two) times daily.   norethindrone 0.35 MG tablet Commonly known as:  MICRONOR,CAMILA,ERRIN Take 1 tablet by mouth daily.   nystatin-triamcinolone cream Commonly known as:  MYCOLOG II Apply 1 application topically daily as needed (rash/irritation).   OZEMPIC (0.25 OR 0.5 MG/DOSE) 2 MG/1.5ML Sopn Generic drug:  Semaglutide(0.25 or 0.5MG/DOS) Inject 0.5 mg into the skin every Tuesday.   rosuvastatin 5 MG tablet Commonly known as:  CRESTOR Take 5 mg by mouth 3 (three) times a week.   silver sulfADIAZINE 1 % cream Commonly known as:  SILVADENE Apply 1 application topically daily. What changed:    when to take this  reasons to take this   verapamil 240 MG CR tablet Commonly known as:  CALAN-SR TAKE 1 TABLET BY MOUTH AT BEDTIME What changed:  when to take this   Vitamin D3 125 MCG (5000 UT) Caps Take 5,000 Units by mouth daily.   XIGDUO XR 01-999 MG Tb24 Generic drug:  Dapagliflozin-metFORMIN HCl ER Take 2 tablets by mouth daily.  Signed: Sandy Salaam Fenna Semel 10/21/2018, 12:58 PM

## 2018-10-21 NOTE — ED Notes (Signed)
ED Provider at bedside. 

## 2018-10-21 NOTE — Progress Notes (Signed)
Orthopedic Tech Progress Note Patient Details:  [name] [birth year] [id number]  Ortho Devices Type of Ortho Device: Wrist splint Ortho Device/Splint Interventions: Application   Post Interventions Patient Tolerated: Well Instructions Provided: Care of device   [place] Pink 10/21/2018, 8:04 AM

## 2018-10-21 NOTE — H&P (Signed)
History and Physical    [name]:[id number] DOB: 07-Apr-1971 DOA: 10/20/2018  PCP: Lavone Orn, MD   Patient coming from: Home   Chief Complaint: Polyarthralgia, redness at right wrist streaking towards elbow   HPI: [name] is a 48 y.o. 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  Patient reports that she had previously been on immunosuppressant medications for her psoriatic arthritis, these were stopped when she was being treated for the diabetic toe infection, and she has remained off of these medications.  She reports approximately 3 days of dull and aching pain in the bilateral shoulders, elbows, and wrists, similar to her prior experiences with psoriatic arthritis, but has also noticed increasing right wrist swelling and redness over the past day with streaking of erythema proximally towards the elbow.  She took 800 mg of Advil at home without any significant relief and now presents for evaluation.  She denies any fevers associated with this.  She denies any trauma or precipitating event.  ED Course: Upon arrival to the ED, patient is found to be afebrile, saturating well on room air, slightly tachycardic, and with stable blood pressure.  Radiographs of the right wrist reveal soft tissue swelling without bony abnormality.  Chemistry panel is unremarkable and CBC notable for slight leukocytosis.  ESR is normal and CRP elevated to 3.4.  Urinalysis is notable for glucosuria, microscopic hematuria, and elevated specific gravity.  MRI of the wrist was obtained and again demonstrates soft tissue edema along the distal ulna with enhancement concerning for cellulitis, as well as  tenosynovitis of the flexor digitus profundus tendon at the level of the carpal tunnel, small radiocarpal and diffuse radial ulnar joint effusions, and mild tendinosis of the extensor carpi ulnaris tendon with possible sub-sheath injury.  IR was consulted by the ED physician and recommended day orthopedic consultation.  Orthopedic surgery was consulted, recommended medical admission, recommended holding antibiotics for now, and they will see her in the morning.  Review of Systems:  All other systems reviewed and apart from HPI, are negative.  Past Medical History:  Diagnosis Date  . Atrial fibrillation ([place])   . Diabetic retinopathy ([place])   . Diabetic ulcer of right great toe [facility])    "been dealing w/it for ~ 1 yr" (05/20/2018)  . Polycystic ovarian syndrome   . Psoriasis   . Psoriatic arthritis ([place])    "pretty much all over; knees, hands, elbows" (05/20/2018)  . SVT (supraventricular tachycardia) ([place])    "had 1 chemical conversion here in the ER" (05/20/2018)  . Type II diabetes mellitus ([place])     Past Surgical History:  Procedure Laterality Date  . AMPUTATION TOE Right 05/22/2018   Procedure: AMPUTATION RIGHT TOE;  Surgeon: Trula Slade, DPM;  Location: [facility];  Service: Podiatry;  Laterality: Right;  . CESAREAN SECTION  2005  . FOOT FRACTURE SURGERY Left    pin put in by Dr. Jacqualyn Posey  . FRACTURE SURGERY    . TUBAL LIGATION  2005     reports that she has never smoked. She has never used smokeless tobacco. She reports previous alcohol use. She reports that she does not use drugs.  Allergies  Allergen Reactions  . Trulicity [Dulaglutide] Diarrhea and Nausea And Vomiting  .  Ciprofloxacin Rash  . Keflex [Cephalexin] Rash  . Latex Rash    Family History  Problem Relation Age of Onset  . Hypertension Mother   . Diabetes Mother   . Asthma Mother   . CAD Father   . Diabetes Sister   . Diabetes Brother   . Hypertension Brother      Prior to Admission medications     Medication Sig Start Date End Date Taking? Authorizing Provider  acetaminophen (TYLENOL) 500 MG tablet Take 1,000 mg by mouth every 6 (six) hours as needed for mild pain or headache.   Yes [provider]  Cholecalciferol (VITAMIN D3) 5000 units CAPS Take 5,000 Units by mouth daily.   Yes [provider]  Dapagliflozin-metFORMIN HCl ER (XIGDUO XR) 01-999 MG TB24 Take 2 tablets by mouth daily.   Yes [provider]  FEROSUL 325 (65 Fe) MG tablet Take 650 mg by mouth daily with breakfast.  12/04/17  Yes [provider]  ibuprofen (ADVIL,MOTRIN) 200 MG tablet Take 800 mg by mouth daily as needed (pain).   Yes [provider]  insulin glargine (LANTUS) 100 unit/mL SOPN Inject 160 Units into the skin at bedtime.   Yes [provider]  norethindrone (MICRONOR,CAMILA,ERRIN) 0.35 MG tablet Take 1 tablet by mouth daily.  02/13/18  Yes [provider]  nystatin-triamcinolone (MYCOLOG II) cream Apply 1 application topically daily as needed (rash/irritation).  10/01/18  Yes [provider]  rosuvastatin (CRESTOR) 5 MG tablet Take 5 mg by mouth 3 (three) times a week. 09/14/18  Yes [provider]  Semaglutide,0.25 or 0.5MG/DOS, (OZEMPIC, 0.25 OR 0.5 MG/DOSE,) 2 MG/1.5ML SOPN Inject 0.5 mg into the skin every Tuesday.   Yes [provider]  silver sulfADIAZINE (SILVADENE) 1 % cream Apply 1 application topically daily. Patient taking differently: Apply 1 application topically daily as needed (for irritation).  09/07/17  Yes Trula Slade, DPM  verapamil (CALAN-SR) 240 MG CR tablet TAKE 1 TABLET BY MOUTH AT BEDTIME Patient taking differently: Take 240 mg by mouth daily.  11/25/15  Yes Jettie Booze, MD  Multiple Vitamin (MULTIVITAMIN WITH MINERALS) TABS tablet Take 1 tablet by mouth daily. Patient not taking: Reported on 10/20/2018 05/25/18   Bonnielee Haff, MD  mupirocin ointment (BACTROBAN) 2 % Apply 1 application  topically 2 (two) times daily. Patient not taking: Reported on 10/20/2018 09/13/18   Trula Slade, DPM    Physical Exam: Vitals:   10/20/18 1757 10/20/18 1822 10/20/18 1845 10/21/18 0215  BP:  132/67 122/80 (!) 107/53  Pulse:  (!) 108 (!) 105 88  Resp:  '19 18 16  ' Temp:  98.8 F (37.1 C)    TempSrc:  Oral    SpO2:  98% 98% 96%  Weight: 104.3 kg     Height: '5\' 6"'  (1.676 m)       Constitutional: NAD, calm  Eyes: PERTLA, lids and conjunctivae normal ENMT: Mucous membranes are moist. Posterior pharynx clear of any exudate or lesions.   Neck: normal, supple, no masses, no thyromegaly Respiratory: clear to auscultation bilaterally, no wheezing, no crackles. Normal respiratory effort.   Cardiovascular: S1 & S2 heard, regular rate and rhythm. No extremity edema.   Abdomen: No distension, no tenderness, soft. Bowel sounds active.  Musculoskeletal: no clubbing / cyanosis. Right wrist is tender and swollen with faint erythema and ROM limited by pain. No fluctuance or drainage.     Skin: Right wrist findings above. Otherwise, no significant rashes, lesions, ulcers. Warm,  dry, well-perfused. Neurologic: No facial asymmetry. Sensation intact. Moving all extremities.  Psychiatric:  Alert and oriented x 3. Calm, copperative.    Labs on Admission: I have personally reviewed following labs and imaging studies  CBC: Recent Labs  Lab 10/20/18 1900  WBC 10.6*  NEUTROABS 8.2*  HGB 14.9  HCT 46.8*  MCV 90.2  PLT 876   Basic Metabolic Panel: Recent Labs  Lab 10/20/18 1900  NA 137  K 3.6  CL 103  CO2 22  GLUCOSE 123*  BUN 7  CREATININE 0.72  CALCIUM 8.8*   GFR: Estimated Creatinine Clearance: 106.1 mL/min (by C-G formula based on SCr of 0.72 mg/dL). Liver Function Tests: Recent Labs  Lab 10/20/18 1900  AST 18  ALT 19  ALKPHOS 60  BILITOT 0.8  PROT 7.7  ALBUMIN 3.4*   No results for input(s): LIPASE, AMYLASE in the last 168 hours. No results for input(s): AMMONIA in  the last 168 hours. Coagulation Profile: No results for input(s): INR, PROTIME in the last 168 hours. Cardiac Enzymes: No results for input(s): CKTOTAL, CKMB, CKMBINDEX, TROPONINI in the last 168 hours. BNP (last 3 results) No results for input(s): PROBNP in the last 8760 hours. HbA1C: No results for input(s): HGBA1C in the last 72 hours. CBG: No results for input(s): GLUCAP in the last 168 hours. Lipid Profile: No results for input(s): CHOL, HDL, LDLCALC, TRIG, CHOLHDL, LDLDIRECT in the last 72 hours. Thyroid Function Tests: No results for input(s): TSH, T4TOTAL, FREET4, T3FREE, THYROIDAB in the last 72 hours. Anemia Panel: No results for input(s): VITAMINB12, FOLATE, FERRITIN, TIBC, IRON, RETICCTPCT in the last 72 hours. Urine analysis:    Component Value Date/Time   COLORURINE YELLOW 10/20/2018 1935   APPEARANCEUR HAZY (A) 10/20/2018 1935   LABSPEC 1.032 (H) 10/20/2018 1935   PHURINE 5.0 10/20/2018 1935   GLUCOSEU >=500 (A) 10/20/2018 1935   HGBUR LARGE (A) 10/20/2018 1935   BILIRUBINUR NEGATIVE 10/20/2018 1935   KETONESUR NEGATIVE 10/20/2018 1935   PROTEINUR NEGATIVE 10/20/2018 1935   UROBILINOGEN 0.2 10/09/2011 1807   NITRITE NEGATIVE 10/20/2018 1935   LEUKOCYTESUR NEGATIVE 10/20/2018 1935   Sepsis Labs: '@LABRCNTIP' (procalcitonin:4,lacticidven:4) )No results found for this or any previous visit (from the past 240 hour(s)).   Radiological Exams on Admission: Dg Wrist Complete Right  Result Date: 10/20/2018 CLINICAL DATA:  Right wrist swelling and pain. No injury. History of psoriatic arthritis. EXAM: RIGHT WRIST - COMPLETE 3+ VIEW COMPARISON:  None. FINDINGS: Dorsal soft tissue swelling over the right wrist. Tiny calcific density within the dorsal soft tissues appears well corticated and likely represents vascular or dystrophic calcification. No definite evidence of any acute fracture or dislocation. No expansile or destructive bone lesions. Bone cortex appears intact.  IMPRESSION: Soft tissue swelling. No acute bony abnormalities. Electronically Signed   By: Lucienne Capers M.D.   On: 10/20/2018 19:23   Mr Wrist Right W Wo Contrast  Result Date: 10/21/2018 CLINICAL DATA:  Right wrist with edema and mild erythema on ulnar side with red streak extending up central volar forearm to elbow. Limited ROM. EXAM: MR OF THE RIGHT WRIST WITHOUT AND WITH CONTRAST TECHNIQUE: Multiplanar multisequence MR imaging of the right wrist was performed both before and after the administration of intravenous contrast. CONTRAST:  10 mL Gadavist COMPARISON:  None. FINDINGS: Ligaments: Intact scapholunate and lunotriquetral ligaments. Triangular fibrocartilage: Intact TFCC. Tendons: Mild tendinosis of the extensor carpi ulnaris tendon with peripheral subluxation concerning for subsheath injury. Remainder of the extensor tendons are intact .  Intact flexor compartment tendons. Tenosynovitis of the flexor digitorum profundus tendons Carpal tunnel/median nerve: Normal carpal tunnel. Normal median nerve. Guyon's canal: Normal. Joint/cartilage: Small joint effusion of the radiocarpal joint and distal radioulnar joint with synovitis. No focal chondral defect. Bones/carpal alignment: No acute osseous abnormality. No aggressive osseous lesion. Normal alignment. Other: Soft tissue edema overlying the distal ulna with enhancement concerning for cellulitis. No drainable fluid collection. IMPRESSION: 1. Soft tissue edema overlying the distal ulna with enhancement concerning for cellulitis. 2. Tenosynovitis of the flexor digitorum profundus tendons at the level of the carpal tunnel which may be secondary to an infectious or inflammatory etiology. Small amount Perifascial fluid along the flexor digitorum profundus muscle. 3. Small radiocarpal and distal radioulnar joint effusion with synovitis secondary to an infectious or inflammatory etiology. 4. Mild tendinosis of the extensor carpi ulnaris tendon with peripheral  subluxation concerning for subsheath injury. Electronically Signed   By: Kathreen Devoid   On: 10/21/2018 00:40    EKG: Not performed.   Assessment/Plan   1. Right wrist pain and swelling  - Presents with polyarthralgia similar to prior flares of psoriatic arthritis, but different in that the right wrist has become increasingly swollen and tender with erythema streaking proximally  - She denies f/c, took 800 mg ibuprofen prior to arrival and is afebrile here  - There is slight leukocytosis, normal ESR, CRP elevated to 3.4  - MRI with suspected cellulitis as well as joint effusions and tenosynovitis that could be infectious or inflammatory  - Hand surgery is consulting and much appreciated, recommended holding antibiotics with plan for possible diagnostic fluid aspiration or washout  - Blood cultures collected in ED  - Continue supportive care, pain-control, follow cultures  2. Insulin-dependent DM  - A1c was 8.0% in September  - Managed at home with Ozempic, dapagliflozin-metformin, and Lantus 160 units qHS  - Check CBG's, continue Lantus with dose-reduction to start, and use a SSI with Novolog while in hospital    3. Paroxysmal atrial fibrillation  - She is in a regular rhythm on admission  - CHADS-VASc is 2 (gender, DM) and she is not anticoagulated  - Continue verapamil     DVT prophylaxis: SCD's  Code Status: Full  Family Communication: Discussed with patient  Consults called: hand surgery  Admission status: Observation     Vianne Bulls, MD [facility] Pager [phone]  If 7PM-7AM, please contact night-coverage [email or web address] Password [email or web address]  10/21/2018, 2:57 AM

## 2018-10-21 NOTE — ED Provider Notes (Signed)
1:30 AM  Assumed care from Dr. Rex Kras.  Patient is a 48 year old female with history of psoriatic arthritis no longer on immunosuppressants who presents to the emergency department with right wrist pain.  MRI was pending for further evaluation.  MRI shows soft tissue edema overlying the distal ulna with enhancement concerning for cellulitis.  Also tenosynovitis of the flexor digitorum profundus tendons which may be due to to infectious versus inflammatory etiology.  She has a small radiocarpal and distal radioulnar joint effusion with synovitis.  Dr. Percell Miller with hand surgery previously aware of patient.  Will update with these MRI findings.  1:45 AM  D/w Dr. Percell Miller on call for hand surgery.  Patient at this time has 2 options to evaluate if this is infectious versus inflammatory nature.  The first would be to have IR tap her joint.  Effusion is small and is not something that I feel I can perform in the ED today.  IR was consulted by Dr. Rex Kras and reported they do not perform these procedures at night not emergently.  The other option would be to take patient to the operating room for a washout.  Patient would prefer to avoid the operating room if possible and I feel this is very reasonable.  Dr. Percell Miller would like to hold on antibiotics at this time given she is stable until we are able to obtain a fluid sample as it may give [place] an equivocal fluid sample.  Offered to allow patient to stay in the waiting room until this happened versus admission.  Patient would prefer admission.  Dr. Percell Miller recommends medicine admission.  Her PCP is Dr. Laurann Montana with [facility].   2:04 AM Discussed patient's case with hospitalist, Dr. Myna Hidalgo.  I have recommended admission and patient (and family if present) agree with this plan. Admitting physician will place admission orders.   I reviewed all nursing notes, vitals, pertinent previous records, EKGs, lab and urine results, imaging (as available).     [name], Delice Bison, DO 10/21/18  [phone]

## 2018-10-25 LAB — CULTURE, BLOOD (ROUTINE X 2)
Culture: NO GROWTH
Culture: NO GROWTH
SPECIAL REQUESTS: ADEQUATE
Special Requests: ADEQUATE

## 2018-11-01 ENCOUNTER — Encounter: Payer: Self-pay | Admitting: Podiatry

## 2018-11-01 ENCOUNTER — Ambulatory Visit (INDEPENDENT_AMBULATORY_CARE_PROVIDER_SITE_OTHER): Payer: PRIVATE HEALTH INSURANCE | Admitting: Podiatry

## 2018-11-01 DIAGNOSIS — L97521 Non-pressure chronic ulcer of other part of left foot limited to breakdown of skin: Secondary | ICD-10-CM | POA: Diagnosis not present

## 2018-11-01 DIAGNOSIS — E1149 Type 2 diabetes mellitus with other diabetic neurological complication: Secondary | ICD-10-CM

## 2018-11-11 NOTE — Progress Notes (Signed)
Subjective: 48 year old female presents the office today for follow-up evaluation of a wound to the bottom of her left big toe.  Overall she states that she is doing about the same she denies any drainage or pus coming from the wound.  Since I last saw her she also presented to the hospital she had what seems to be a psoriatic arthritis flare and she recently was restarted on Cosentyx.  She has not seen Dr. Lenna Gilford she states in some time. Denies any redness or swelling to the toe. Denies any systemic complaints such as fevers, chills, nausea, vomiting. No acute changes since last appointment, and no other complaints at this time.   Objective: AAO x3, NAD DP/PT pulses palpable bilaterally, CRT less than 3 seconds On the plantar aspect the left hallux along the hallux IPJ is a superficial granular wound measuring approximately 0.2 x 0.2 cm and is superficial without any probing, undermining or tunneling.  Overall the wound is about the same.  There is no probing, and wintertime.  There is no swelling erythema, ascending cellulitis.  No fluctuation or crepitation malodor. On the right foot the incision from the surgery site is well-healed.  There is prominent metatarsal head plantarly and hammertoe deformities are present with there is no open sores. No pain with calf compression, swelling, warmth, erythema  Assessment: 48 year old female with healing wound left hallux  Plan: -All treatment options discussed with the patient including all alternatives, risks, complications.  -I sharply debrided the wound to the any complications or bleeding utilizing tissue nippers with a 312 blade scalpel down to healthy, granular tissue.  Overall the wound is doing the same.  I had Betha evaluate her as well and he further offloading her surgical shoe.  I also discussed with Seth [place] surgical debridement, closure of the wound.  We will continue to monitor for now.  We will further discuss next appointment. -Monitor for  any clinical signs or symptoms of infection and directed to call the office immediately should any occur or go to the ER.  Trula Slade DPM

## 2018-11-15 ENCOUNTER — Ambulatory Visit (INDEPENDENT_AMBULATORY_CARE_PROVIDER_SITE_OTHER): Payer: PRIVATE HEALTH INSURANCE | Admitting: Podiatry

## 2018-11-15 ENCOUNTER — Other Ambulatory Visit: Payer: Self-pay | Admitting: Podiatry

## 2018-11-15 ENCOUNTER — Ambulatory Visit (INDEPENDENT_AMBULATORY_CARE_PROVIDER_SITE_OTHER): Payer: PRIVATE HEALTH INSURANCE

## 2018-11-15 ENCOUNTER — Encounter: Payer: Self-pay | Admitting: Podiatry

## 2018-11-15 DIAGNOSIS — L97521 Non-pressure chronic ulcer of other part of left foot limited to breakdown of skin: Secondary | ICD-10-CM

## 2018-11-15 DIAGNOSIS — E1149 Type 2 diabetes mellitus with other diabetic neurological complication: Secondary | ICD-10-CM | POA: Diagnosis not present

## 2018-11-15 DIAGNOSIS — L02612 Cutaneous abscess of left foot: Secondary | ICD-10-CM | POA: Diagnosis not present

## 2018-11-15 DIAGNOSIS — M79672 Pain in left foot: Secondary | ICD-10-CM

## 2018-11-15 MED ORDER — SULFAMETHOXAZOLE-TRIMETHOPRIM 800-160 MG PO TABS: 1.0000 | ORAL_TABLET | Freq: Two times a day (BID) | ORAL | 0 refills | Status: DC

## 2018-11-16 NOTE — Progress Notes (Signed)
Subjective: 48 year old female presents the office today for follow-up evaluation of a wound to the bottom of her left big toe.  She states that there is been doing well and she denies any drainage or pus.  She gets very little drainage coming from the area.  She has been think about it she feels that when she is walking her shoe may be too big and her toe is slightly causing the toe to become irritated.  She also did again pick a piece of toenail out of the nail bed causing some bleeding.  Denies any drainage or pus.  The right side is doing well. Denies any systemic complaints such as fevers, chills, nausea, vomiting. No acute changes since last appointment, and no other complaints at this time.   Objective: AAO x3, NAD DP/PT pulses palpable bilaterally, CRT less than 3 seconds On the plantar aspect the left hallux along the hallux IPJ is a superficial granular wound however today there appears to be a blister around the area.  After debrided the wound measured 1 x 0.5 cm and is superficial with a granular base.  There is no probing, undermining or tunneling.  There is no surrounding erythema, ascending cellulitis.  There is no edema to the toe.  There is no fluctuation crepitation any malodor.  Superficial abrasion type area on the nail bed site where she removed a piece of toenail.  No signs of infection. Upon weightbearing evaluation her toe does sit slightly extended dorsiflex the IPJ. No pain with calf compression, swelling, warmth, erythema  Assessment: 48 year old female with healing wound left hallux/blister; onychodystrophy  Plan: -All treatment options discussed with the patient including all alternatives, risks, complications.  -X-rays were obtained reviewed.  There is no cortical destruction suggestive of osteomyelitis at this time.  No soft tissue emphysema.  No fracture.  Screw intact in the prior fifth metatarsal base fracture -In regards to the wound is sharply debrided without any  complications down to healthy, bleeding tissue.  I did remove all the old blister skin today. We will do Prima dressing changes daily and I dispensed this for her today.  Also dispensed a Darco wedge shoe and I also added a pad to help further take pressure off the hallux.  She is healed most completely before and then opened back up (because of pressure.  Also, work on new shoes.  Discussed surgical intervention but she has some upcoming events.  This is been a chronic wound for 2 years and has fluctuated in size.  Declines wound care consult. -Wound culture results were taken today. -The wound has worsened with the posterior abdomen prescribed Bactrim.  Trula Slade DPM  -I sharply debrided the wound to the any complications or bleeding utilizing tissue nippers with a 312 blade scalpel down to healthy, granular tissue.  Overall the wound is doing the same.  I had Betha evaluate her as well and he further offloading her surgical shoe.  I also discussed with Seth [place] surgical debridement, closure of the wound.  We will continue to monitor for now.  We will further discuss next appointment. -Monitor for any clinical signs or symptoms of infection and directed to call the office immediately should any occur or go to the ER.  Trula Slade DPM

## 2018-11-18 ENCOUNTER — Other Ambulatory Visit: Payer: Self-pay | Admitting: Podiatry

## 2018-11-18 LAB — WOUND CULTURE
MICRO NUMBER:: 256973
SPECIMEN QUALITY:: ADEQUATE

## 2018-11-18 MED ORDER — AMPICILLIN 500 MG PO CAPS: 500.0000 mg | ORAL_CAPSULE | Freq: Four times a day (QID) | ORAL | 0 refills | Status: DC

## 2018-11-26 ENCOUNTER — Encounter: Payer: Self-pay | Admitting: Podiatry

## 2018-11-26 ENCOUNTER — Ambulatory Visit (INDEPENDENT_AMBULATORY_CARE_PROVIDER_SITE_OTHER): Payer: PRIVATE HEALTH INSURANCE | Admitting: Podiatry

## 2018-11-26 DIAGNOSIS — L97521 Non-pressure chronic ulcer of other part of left foot limited to breakdown of skin: Secondary | ICD-10-CM | POA: Diagnosis not present

## 2018-11-26 DIAGNOSIS — L600 Ingrowing nail: Secondary | ICD-10-CM

## 2018-11-27 NOTE — Progress Notes (Signed)
Subjective: 48 year old female presents the office today for follow-up evaluation of a wound to the bottom of her left big toe.  Overall she states that she is doing better.  She is been using the wedge shoe in the offloading pads.  She has been keeping Prisma on the wound daily.  She denies any drainage or pus and denies any surrounding redness or red streaks.  Overall she feels the wound is healing. Denies any systemic complaints such as fevers, chills, nausea, vomiting. No acute changes since last appointment, and no other complaints at this time.   Objective: AAO x3, NAD DP/PT pulses palpable bilaterally, CRT less than 3 seconds On the plantar aspect the left hallux along the hallux IPJ is a superficial granular wound.  I debrided the hyperkeratotic periwound the wound is substantially improved in the past the wound is closed.  Some is a pinpoint type lesion.  There is no surrounding erythema, ascending cellulitis.  There is no fluctuation or crepitation any malodor.  There is some mild ingrowing on the medial nail border.  No edema, erythema or any signs of infection. No pain with calf compression, swelling, warmth, erythema  Assessment: 48 year old female with healing wound left hallux; ingrown toenail  Plan: -All treatment options discussed with the patient including all alternatives, risks, complications.  Debridement of the wound last #312 with scalpel down to healthy, bleeding tissue.  Overall the wound is substantially improved noticed a pinpoint type lesion.  We will continue with Prima dressing changes daily. -Recommend continue offloading at all times.  She is going to [place] this weekend and I discussed with her to continue offloading to be very careful putting on a walking. -I debrided the ingrown toenail left hallux with any complications.  Recommend antibiotic ointment and bandage daily. -Monitor for any clinical signs or symptoms of infection and directed to call the office  immediately should any occur or go to the ER.  Return in about 2 weeks (around 12/10/2018).  Trula Slade DPM

## 2018-11-29 ENCOUNTER — Ambulatory Visit: Payer: PRIVATE HEALTH INSURANCE | Admitting: Podiatry

## 2018-12-16 ENCOUNTER — Other Ambulatory Visit: Payer: Self-pay

## 2018-12-16 ENCOUNTER — Other Ambulatory Visit: Payer: Self-pay | Admitting: Podiatry

## 2018-12-16 ENCOUNTER — Encounter: Payer: Self-pay | Admitting: Podiatry

## 2018-12-16 ENCOUNTER — Ambulatory Visit (INDEPENDENT_AMBULATORY_CARE_PROVIDER_SITE_OTHER): Payer: PRIVATE HEALTH INSURANCE | Admitting: Podiatry

## 2018-12-16 ENCOUNTER — Ambulatory Visit: Payer: PRIVATE HEALTH INSURANCE | Admitting: Orthotics

## 2018-12-16 VITALS — Temp 98.3°F

## 2018-12-16 DIAGNOSIS — S98111A Complete traumatic amputation of right great toe, initial encounter: Secondary | ICD-10-CM | POA: Diagnosis not present

## 2018-12-16 DIAGNOSIS — L539 Erythematous condition, unspecified: Secondary | ICD-10-CM

## 2018-12-16 DIAGNOSIS — L97521 Non-pressure chronic ulcer of other part of left foot limited to breakdown of skin: Secondary | ICD-10-CM

## 2018-12-16 DIAGNOSIS — M2041 Other hammer toe(s) (acquired), right foot: Secondary | ICD-10-CM

## 2018-12-16 DIAGNOSIS — L02612 Cutaneous abscess of left foot: Secondary | ICD-10-CM

## 2018-12-16 DIAGNOSIS — M2042 Other hammer toe(s) (acquired), left foot: Secondary | ICD-10-CM

## 2018-12-16 MED ORDER — AMOXICILLIN-POT CLAVULANATE 875-125 MG PO TABS: 1.0000 | ORAL_TABLET | Freq: Two times a day (BID) | ORAL | 0 refills | Status: DC

## 2018-12-16 NOTE — Progress Notes (Signed)

## 2018-12-16 NOTE — Progress Notes (Signed)
Subjective: 48 year old female presents the office today for follow-up evaluation of a wound to the bottom of her left big toe.  She states that overall she is doing well.  She has been in the surgical shoe and offloading at all times.  She did not get go to [place] that she was planning on going because of the recent virus.  She does admit that over the weekend she was very active on her foot and she was doing a lot of yard work as well as cleaning.  Since then she has been surgical becoming somewhat red. Denies any systemic complaints such as fevers, chills, nausea, vomiting. No acute changes since last appointment, and no other complaints at this time.   Objective: AAO x3, NAD DP/PT pulses palpable bilaterally, CRT less than 3 seconds On the plantar aspect the left hallux along the hallux IPJ is a superficial granular wound with hyperkeratotic periwound.  There is localized edema to the toe but no significant increase in warmth there is no ascending cellulitis pain is no drainage or pus identified there is no fluctuation crepitation any malodor.  After debridement of the wound get measured 0.4 x 0.3 cm.  Prior to debridement 0.2 x 0.2  No pain with calf compression, swelling, warmth, erythema      Assessment: 48 year old female with healing wound left hallux; localized erythema  Plan: -All treatment options discussed with the patient including all alternatives, risks, complications.  -Debridement of the wound last #312 with scalpel down to healthy, bleeding tissue.  Overall the wound is substantially improved noticed a pinpoint type lesion.  Continue with Prisma every other day. -Given the erythema I prescribed Augmentin.  Monitoring signs or symptoms worsen infection with ER call the office should any occur.  Advised her to limit her activity. Liliane Channel saw her today for diabetic shoes. Hopefully we can better offload this as I think that is the main thing contributing to the wound.   -Monitor for any clinical signs or symptoms of infection and directed to call the office immediately should any occur or go to the ER.  Consider TCC next appointment but she has declined previously and given the redness today I don't want to cover the toes.   Return in about 2 weeks.  Trula Slade DPM

## 2018-12-31 ENCOUNTER — Encounter: Payer: Self-pay | Admitting: Podiatry

## 2018-12-31 ENCOUNTER — Ambulatory Visit (INDEPENDENT_AMBULATORY_CARE_PROVIDER_SITE_OTHER): Payer: PRIVATE HEALTH INSURANCE | Admitting: Podiatry

## 2018-12-31 ENCOUNTER — Other Ambulatory Visit: Payer: Self-pay

## 2018-12-31 VITALS — Temp 97.9°F

## 2018-12-31 DIAGNOSIS — L97521 Non-pressure chronic ulcer of other part of left foot limited to breakdown of skin: Secondary | ICD-10-CM

## 2019-01-02 NOTE — Progress Notes (Signed)
Subjective: 48 year old female presents the office today for follow-up evaluation of a wound to the bottom of her left big toe.  Overall she states the toe is doing okay.  She denies any drainage or pus coming from the area and she has noticed a callus overlying the area of the wound.  She has been wearing a surgical shoe.  She states that she was out in the yard during [employer] as well.  She denies any drainage positive swelling. Denies any systemic complaints such as fevers, chills, nausea, vomiting. No acute changes since last appointment, and no other complaints at this time.   Objective: AAO x3, NAD DP/PT pulses palpable bilaterally, CRT less than 3 seconds On the plantar aspect the left hallux is a hyperkeratotic lesion.  Upon debridement of the area there was a superficial granular wound present measuring about the same size 0.4 x 0.4 cm.  There is no probing to bone, undermining or tunneling.  There is no edema, erythema to the toe.  Is no fluctuation of the rotation or ascending cellulitis.  There is no malodor. No pain with calf compression, swelling, warmth, erythema        Assessment: 48 year old female with healing wound left hallux   Plan: -All treatment options discussed with the patient including all alternatives, risks, complications.  -Debridement of the wound last #312 with scalpel down to healthy, bleeding tissue Iodosorb was applied followed by a foam dressing. I was going to apply a TCC today but unfortunately we did not have the supplies. Will plan for next week.  -Continue with daily dressing changes -Monitor for any clinical signs or symptoms of infection and directed to call the office immediately should any occur or go to the ER.  Return in about 1 week (around 01/07/2019).

## 2019-01-07 ENCOUNTER — Encounter: Payer: Self-pay | Admitting: Podiatry

## 2019-01-07 ENCOUNTER — Ambulatory Visit (INDEPENDENT_AMBULATORY_CARE_PROVIDER_SITE_OTHER): Payer: PRIVATE HEALTH INSURANCE | Admitting: Podiatry

## 2019-01-07 ENCOUNTER — Other Ambulatory Visit: Payer: PRIVATE HEALTH INSURANCE | Admitting: Orthotics

## 2019-01-07 ENCOUNTER — Other Ambulatory Visit: Payer: Self-pay

## 2019-01-07 VITALS — Temp 97.5°F

## 2019-01-07 DIAGNOSIS — L97521 Non-pressure chronic ulcer of other part of left foot limited to breakdown of skin: Secondary | ICD-10-CM | POA: Diagnosis not present

## 2019-01-07 NOTE — Progress Notes (Signed)
Subjective: 48 year old female presents the office today for follow-up evaluation of a wound to the bottom of her left big toe.  She states that she has been putting the foam dressing on the toe daily. I dispensed an extra piece of foam from a TCC last appointment.  She also states that she can feel her foot more.  She states that the modifications made to her surgical shoe is been helpful.  She denies any drainage or pus.  She says the wound is getting better.  No increase in swelling or redness.  No drainage or pus.  No other concerns today. Denies any systemic complaints such as fevers, chills, nausea, vomiting. No acute changes since last appointment, and no other complaints at this time.   Objective: AAO x3, NAD DP/PT pulses palpable bilaterally, CRT less than 3 seconds On the plantar aspect the left hallux is a hyperkeratotic lesion.  Wound appears to be healed but upon debridement there is still a small superficial opening measuring 0.1 x 0.2 cm however is almost completely healed.  There is no probing but I wonder tunneling.  There is no surrounding erythema, ascending cellulitis.  Is no fluctuation crepitation malodor. No other open lesions identified at this time. No pain with calf compression, swelling, warmth, erythema          Assessment: 48 year old female with healing wound left hallux with much improvement   Plan: -All treatment options discussed with the patient including all alternatives, risks, complications.  -There was a callus around the area.  Upon debridement there is small ulceration still present but is much improved. Debridement was preformed with a #312 blade scalpel. Continue with daily dressing changes.  Since this was changed to improve gravel, total contact cast.  Clinically looks good healed before treatment for callus.  Once the wound heals completely callus formed to use moisturizer to the area daily.  Overall doing doing much better wanted to continue with  surgical shoe with offloading.  She has been in a OrthoWedge shoe with offloading of the hallux which is been very helpful that we did last week. -I applied medihoney to the wound today.  -Monitor for any clinical signs or symptoms of infection and directed to call the office immediately should any occur or go to the ER.   Trula Slade DPM

## 2019-01-16 ENCOUNTER — Encounter: Payer: Self-pay | Admitting: Podiatry

## 2019-01-16 ENCOUNTER — Telehealth: Payer: Self-pay | Admitting: Podiatry

## 2019-01-16 NOTE — Telephone Encounter (Signed)
Pt wanted Lattie Haw to call her about her great toenail.

## 2019-01-16 NOTE — Telephone Encounter (Signed)
Spoke with patient. [name]

## 2019-01-20 ENCOUNTER — Ambulatory Visit (INDEPENDENT_AMBULATORY_CARE_PROVIDER_SITE_OTHER): Payer: PRIVATE HEALTH INSURANCE | Admitting: Podiatry

## 2019-01-20 ENCOUNTER — Encounter: Payer: Self-pay | Admitting: Podiatry

## 2019-01-20 ENCOUNTER — Other Ambulatory Visit: Payer: Self-pay

## 2019-01-20 VITALS — Temp 97.3°F

## 2019-01-20 DIAGNOSIS — L97521 Non-pressure chronic ulcer of other part of left foot limited to breakdown of skin: Secondary | ICD-10-CM | POA: Diagnosis not present

## 2019-01-21 ENCOUNTER — Ambulatory Visit: Payer: PRIVATE HEALTH INSURANCE | Admitting: Podiatry

## 2019-01-22 NOTE — Progress Notes (Signed)
Subjective: 47 year old female presents the office today for follow-up evaluation of a wound to the bottom of her left big toe last appointment the wound appeared to be healed.  She usually keeps Coflex on the area however she ran out so she started putting Band-Aids on the toe.  Since she started changing the Band-Aid she noticed the skin started to break down some.  Denies any drainage or pus and denies any redness or swelling.  She is still wearing a surgical shoe and offloading.  She also presents today to pick up inserts for her shoes with Liliane Channel. Denies any systemic complaints such as fevers, chills, nausea, vomiting. No acute changes since last appointment, and no other complaints at this time.   Objective: AAO x3, NAD DP/PT pulses palpable bilaterally, CRT less than 3 seconds On the plantar aspect the left hallux is a hyperkeratotic lesion.  Upon debridement there is an area of superficial skin breakdown measuring 0.2 x 0.2 cm.  There is no probing to bone, undermining or tunneling there is a very superficial.  No surrounding erythema, ascending cellulitis.  No fluctuation or crepitation malodor. No signs of infection.  No pain with calf compression, swelling, warmth, erythema  Assessment: 48 year old female with healing wound left hallux with mild reoccurrence  Plan: -All treatment options discussed with the patient including all alternatives, risks, complications.  -Hyperkeratotic lesion sharply debrided on the plantar aspect of the hallux.  Upon debridement there is a superficial area of the wound. Recommended antibiotic ointment and bandage daily. Hold off on band-aids. Inserts were dispensed.  Discussed gradual break-in of this.  We have offloaded the hallux on the left side.  If there is any worsening of the wound let me know.  Trula Slade DPM

## 2019-02-03 ENCOUNTER — Encounter: Payer: Self-pay | Admitting: Podiatry

## 2019-02-03 ENCOUNTER — Other Ambulatory Visit: Payer: Self-pay

## 2019-02-03 ENCOUNTER — Ambulatory Visit (INDEPENDENT_AMBULATORY_CARE_PROVIDER_SITE_OTHER): Payer: PRIVATE HEALTH INSURANCE | Admitting: Podiatry

## 2019-02-03 ENCOUNTER — Ambulatory Visit (INDEPENDENT_AMBULATORY_CARE_PROVIDER_SITE_OTHER): Payer: PRIVATE HEALTH INSURANCE

## 2019-02-03 VITALS — Temp 97.9°F

## 2019-02-03 DIAGNOSIS — M205X2 Other deformities of toe(s) (acquired), left foot: Secondary | ICD-10-CM

## 2019-02-03 DIAGNOSIS — L97521 Non-pressure chronic ulcer of other part of left foot limited to breakdown of skin: Secondary | ICD-10-CM

## 2019-02-03 NOTE — Patient Instructions (Signed)
Pre-Operative Instructions  Congratulations, you have decided to take an important step towards improving your quality of life.  You can be assured that the doctors and staff at [facility] will be with you every step of the way.  Here are some important things you should know:  1. Plan to be at the surgery center/hospital at least 1 (one) hour prior to your scheduled time, unless otherwise directed by the surgical center/hospital staff.  You must have a responsible adult accompany you, remain during the surgery and drive you home.  Make sure you have directions to the surgical center/hospital to ensure you arrive on time. 2. If you are having surgery at [facility] or [facility], you will need a copy of your medical history and physical form from your family physician within one month prior to the date of surgery. We will give you a form for your primary physician to complete.  3. We make every effort to accommodate the date you request for surgery.  However, there are times where surgery dates or times have to be moved.  We will contact you as soon as possible if a change in schedule is required.   4. No aspirin/ibuprofen for one week before surgery.  If you are on aspirin, any non-steroidal anti-inflammatory medications (Mobic, Aleve, Ibuprofen) should not be taken seven (7) days prior to your surgery.  You make take Tylenol for pain prior to surgery.  5. Medications - If you are taking daily heart and blood pressure medications, seizure, reflux, allergy, asthma, anxiety, pain or diabetes medications, make sure you notify the surgery center/hospital before the day of surgery so they can tell you which medications you should take or avoid the day of surgery. 6. No food or drink after midnight the night before surgery unless directed otherwise by surgical center/hospital staff. 7. No alcoholic beverages 24-hours prior to surgery.  No smoking 24-hours prior or 24-hours after  surgery. 8. Wear loose pants or shorts. They should be loose enough to fit over bandages, boots, and casts. 9. Don't wear slip-on shoes. Sneakers are preferred. 10. Bring your boot with you to the surgery center/hospital.  Also bring crutches or a walker if your physician has prescribed it for you.  If you do not have this equipment, it will be provided for you after surgery. 11. If you have not been contacted by the surgery center/hospital by the day before your surgery, call to confirm the date and time of your surgery. 12. Leave-time from work may vary depending on the type of surgery you have.  Appropriate arrangements should be made prior to surgery with your employer. 13. Prescriptions will be provided immediately following surgery by your doctor.  Fill these as soon as possible after surgery and take the medication as directed. Pain medications will not be refilled on weekends and must be approved by the doctor. 14. Remove nail polish on the operative foot and avoid getting pedicures prior to surgery. 15. Wash the night before surgery.  The night before surgery wash the foot and leg well with water and the antibacterial soap provided. Be sure to pay special attention to beneath the toenails and in between the toes.  Wash for at least three (3) minutes. Rinse thoroughly with water and dry well with a towel.  Perform this wash unless told not to do so by your physician.  Enclosed: 1 Ice pack (please put in freezer the night before surgery)   1 Hibiclens skin cleaner     Pre-op instructions  If you have any questions regarding the instructions, please do not hesitate to call our office.  [place]: [address], [place], [place] [address] -- [phone]  [place]: [address] [place], [place] [address] -- [phone]  [place]: [address]  , [place] [address] -- [phone]  [place]: [address], [address], [place], [place] [address] -- [phone]  Website:  [email or web address] 

## 2019-02-04 NOTE — Progress Notes (Signed)
Subjective: 48 year old female presents the office today for follow-up evaluation of a wound to the bottom of her left big.  She states that she is frustrated with the toe.  The toe again healed but open back up.  She wants to consider surgical intervention given the long-term nature of the wound.  No swelling, redness, drainage or any other signs of infection.  She has returned to regular shoe with the insert.  She has no other concerns today.  She denies any fevers, chills, nausea, vomiting.  No calf pain, chest pain, shortness of breath.  Objective: AAO x3, NAD DP/PT pulses palpable bilaterally, CRT less than 3 seconds On the plantar aspect the left hallux is a hyperkeratotic lesion.  Upon debridement there is an area of superficial skin breakdown measuring 0.3 x 0.2 cm.  There is no edema, erythema, drainage or pus or any clinical signs of infection noted today.  Upon weightbearing there is hallux extensors present which I think is putting pressure on the plantar hallux IPJ. No pain with calf compression, swelling, warmth, erythema  Assessment: 49 year old female with healing wound left hallux with reoccurrence  Plan: -All treatment options discussed with the patient including all alternatives, risks, complications.  -X-rays were communicated.  A skin marker was utilized to identify the area of the skin ulcer.  No evidence of osteomyelitis of the soft tissue emphysema or fracture. -I did like debride the wound with any complications of bleeding utilizing a 312 with scalpel down to healthy, granular tissue.  Continue with daily dressing changes now with antibiotic ointment. -We discussed surgical intervention.  After discussion we discussed the hallux IPJ arthroplasty with K wire fixation and possible excision of the wound.  We discussed the surgery as well as postoperative course and she wishes to proceed. -The incision placement as well as the postoperative course was discussed with the  patient. I discussed risks of the surgery which include, but not limited to, infection, bleeding, pain, swelling, need for further surgery, delayed or nonhealing, painful or ugly scar, numbness or sensation changes, over/under correction, recurrence, transfer lesions, further deformity, hardware failure, DVT/PE, loss of toe/foot. Patient understands these risks and wishes to proceed with surgery. The surgical consent was reviewed with the patient all 3 pages were signed. No promises or guarantees were given to the outcome of the procedure. All questions were answered to the best of my ability. Before the surgery the patient was encouraged to call the office if there is any further questions. The surgery will be performed at the [facility] on an outpatient basis.  Trula Slade DPM

## 2019-02-06 ENCOUNTER — Telehealth: Payer: Self-pay | Admitting: *Deleted

## 2019-02-07 NOTE — Telephone Encounter (Signed)
I was calling to see if we could move your surgery to 03/05/2019.  "I can't do that, I have already made arrangements with my job.  I work for [employer].  We have to make arrangements way in advance and I have gotten approved for that time." The time was not available when the assistant gave you that date.  "Lattie Haw didn't give me that date, Dr. Jacqualyn Posey did."  He may have overlooked that he is assisting another doctor on that date.  "I'm not able to do it another date."  I will see if I can work something out.  Well see what you can work out and let me know."

## 2019-02-11 NOTE — Telephone Encounter (Signed)
Pt called back to follow up on if she can keep her surgery on 10 June. Stated she has not heard anything back since her conversation with Delydia on 21 May. Pt wants to know if she can keep her surgery on 10 June.

## 2019-02-11 NOTE — Telephone Encounter (Signed)
Left voicemail confirming that she will be having her surgery on 10 June and that I would be contacting [facility] to make sure we can have her surgery scheduled as the first one at 7 am. Told pt to call back with any other questions.

## 2019-02-11 NOTE — Telephone Encounter (Signed)
We discussed a date when she was here. I am not sure what happened but she wanted to keep the original date. I have no reason to change it that I know of.

## 2019-02-17 ENCOUNTER — Ambulatory Visit (INDEPENDENT_AMBULATORY_CARE_PROVIDER_SITE_OTHER): Payer: PRIVATE HEALTH INSURANCE | Admitting: Podiatry

## 2019-02-17 ENCOUNTER — Other Ambulatory Visit: Payer: Self-pay

## 2019-02-17 ENCOUNTER — Encounter: Payer: Self-pay | Admitting: Podiatry

## 2019-02-17 VITALS — Temp 97.5°F

## 2019-02-17 DIAGNOSIS — E1149 Type 2 diabetes mellitus with other diabetic neurological complication: Secondary | ICD-10-CM

## 2019-02-17 DIAGNOSIS — L97521 Non-pressure chronic ulcer of other part of left foot limited to breakdown of skin: Secondary | ICD-10-CM | POA: Diagnosis not present

## 2019-02-19 ENCOUNTER — Telehealth: Payer: Self-pay | Admitting: *Deleted

## 2019-02-19 NOTE — Telephone Encounter (Signed)
"  I am scheduled to have surgery on June 10, Wednesday of next week.  Dr. Jacqualyn Posey was supposed to ask the surgical center if I would need to have a COVID test before the surgery and if I would have to go in quarantine.  Can you tell me whether he found out that information?"  Yes, he said they will perform a test.  He does not think you have to quarantine yourself but he suggests you limit the people you come in contact with.  "Do you know when and where I need to do the test?"  I think they will do the test there but you may want to call them to find out this information.  "Do you have their number on hand so I can call them?"  The surgical center phone number is [phone].

## 2019-02-21 ENCOUNTER — Telehealth: Payer: Self-pay | Admitting: *Deleted

## 2019-02-21 NOTE — Telephone Encounter (Signed)
DOS 02/26/2019, CPT CODES: 92493 - HALLUX IPJ ARTHROPLASTY AND 24199 - HALLUX EXCISION OF ULCER OF LEFT FOOT  MEDCOST: Effective Date -   Deductible:$1000.00 MET Co-insurance: 100% OOP: $7000.00 MET  NO AUTHORIZATION REQUIRED PER [employer]  CALL REF. # [id number]

## 2019-02-26 ENCOUNTER — Other Ambulatory Visit: Payer: Self-pay | Admitting: Podiatry

## 2019-02-26 ENCOUNTER — Encounter: Payer: Self-pay | Admitting: Podiatry

## 2019-02-26 ENCOUNTER — Other Ambulatory Visit: Payer: PRIVATE HEALTH INSURANCE

## 2019-02-26 DIAGNOSIS — E11621 Type 2 diabetes mellitus with foot ulcer: Secondary | ICD-10-CM | POA: Diagnosis not present

## 2019-02-26 DIAGNOSIS — D2372 Other benign neoplasm of skin of left lower limb, including hip: Secondary | ICD-10-CM | POA: Diagnosis not present

## 2019-02-26 MED ORDER — CLINDAMYCIN HCL 300 MG PO CAPS: 300.0000 mg | ORAL_CAPSULE | Freq: Three times a day (TID) | ORAL | 0 refills | Status: DC

## 2019-02-26 MED ORDER — HYDROCODONE-ACETAMINOPHEN 5-325 MG PO TABS: 1.0000 | ORAL_TABLET | Freq: Four times a day (QID) | ORAL | 0 refills | Status: AC | PRN

## 2019-02-26 NOTE — Progress Notes (Signed)
Postop medications sent 

## 2019-02-26 NOTE — Progress Notes (Signed)
Subjective: 48 year old female presents the office today for follow-up evaluation of a wound to the bottom of her left big.  Overall he states it is still present about the same.  She is scheduled for surgery next week.  She denies any drainage or pus over the area she denies any swelling or redness.  She has no other concerns today. She denies any fevers, chills, nausea, vomiting.  No calf pain, chest pain, shortness of breath.  Objective: AAO x3, NAD DP/PT pulses palpable bilaterally, CRT less than 3 seconds On the plantar aspect the left hallux is a hyperkeratotic lesion.  Upon debridement there is still a superficial granular wound present measure about 0.2 x 0.2 cm.  There is no probing to bone, undermining or tunneling and superficial.  There is no edema, erythema or increase in warmth.  Is no fluctuation or crepitation malodor.  Overall doing somewhat better but still present. No pain with calf compression, swelling, warmth, erythema  Assessment: 48 year old female with healing wound left hallux with reoccurrence  Plan: -All treatment options discussed with the patient including all alternatives, risks, complications.  -I debrided the wounds have any complications or bleeding utilized #312 with scalpel.  Continue with daily dressing changes.  Continue offloading shoe.  She is wearing inserts with offloading which we will continue with.  I can discuss with surgery loss of postoperative course and she wishes to proceed.  I will see her next week for surgery but encouraged to call any questions or concerns any changes in the meantime. -Monitor for any clinical signs or symptoms of infection and directed to call the office immediately should any occur or go to the ER.  Trula Slade DPM  DPM

## 2019-02-27 ENCOUNTER — Telehealth: Payer: Self-pay | Admitting: Podiatry

## 2019-02-27 ENCOUNTER — Encounter: Payer: Self-pay | Admitting: Podiatry

## 2019-02-27 ENCOUNTER — Telehealth: Payer: Self-pay | Admitting: *Deleted

## 2019-02-27 NOTE — Telephone Encounter (Signed)
Called and left a message for the patient to call me back at the [place] office at [phone]. Lattie Haw

## 2019-02-27 NOTE — Telephone Encounter (Signed)
Pt is returning a phone call from [facility], please call patient back. She had sx yesterday

## 2019-03-04 ENCOUNTER — Ambulatory Visit (INDEPENDENT_AMBULATORY_CARE_PROVIDER_SITE_OTHER): Payer: Self-pay | Admitting: Podiatry

## 2019-03-04 ENCOUNTER — Other Ambulatory Visit: Payer: Self-pay

## 2019-03-04 ENCOUNTER — Encounter: Payer: Self-pay | Admitting: Podiatry

## 2019-03-04 ENCOUNTER — Ambulatory Visit: Payer: PRIVATE HEALTH INSURANCE | Admitting: Podiatry

## 2019-03-04 ENCOUNTER — Ambulatory Visit (INDEPENDENT_AMBULATORY_CARE_PROVIDER_SITE_OTHER): Payer: PRIVATE HEALTH INSURANCE

## 2019-03-04 VITALS — Temp 97.6°F

## 2019-03-04 DIAGNOSIS — L02612 Cutaneous abscess of left foot: Secondary | ICD-10-CM

## 2019-03-04 DIAGNOSIS — L97521 Non-pressure chronic ulcer of other part of left foot limited to breakdown of skin: Secondary | ICD-10-CM | POA: Diagnosis not present

## 2019-03-04 DIAGNOSIS — Z09 Encounter for follow-up examination after completed treatment for conditions other than malignant neoplasm: Secondary | ICD-10-CM

## 2019-03-04 MED ORDER — CIPROFLOXACIN HCL 500 MG PO TABS: 500.0000 mg | ORAL_TABLET | Freq: Two times a day (BID) | ORAL | 0 refills | Status: DC

## 2019-03-04 MED ORDER — AMOXICILLIN-POT CLAVULANATE 875-125 MG PO TABS: 1.0000 | ORAL_TABLET | Freq: Two times a day (BID) | ORAL | 0 refills | Status: DC

## 2019-03-04 NOTE — Telephone Encounter (Signed)
Patient had an appointment today. [name]

## 2019-03-06 ENCOUNTER — Other Ambulatory Visit: Payer: Self-pay

## 2019-03-06 ENCOUNTER — Ambulatory Visit (INDEPENDENT_AMBULATORY_CARE_PROVIDER_SITE_OTHER): Payer: PRIVATE HEALTH INSURANCE | Admitting: Podiatry

## 2019-03-06 ENCOUNTER — Encounter: Payer: Self-pay | Admitting: Podiatry

## 2019-03-06 VITALS — Temp 98.2°F

## 2019-03-06 DIAGNOSIS — L03032 Cellulitis of left toe: Secondary | ICD-10-CM

## 2019-03-06 DIAGNOSIS — L02612 Cutaneous abscess of left foot: Secondary | ICD-10-CM

## 2019-03-06 DIAGNOSIS — L97521 Non-pressure chronic ulcer of other part of left foot limited to breakdown of skin: Secondary | ICD-10-CM

## 2019-03-06 NOTE — Addendum Note (Signed)
Addended by: Cranford Mon R on: 03/06/2019 01:42 PM   Modules accepted: Orders

## 2019-03-06 NOTE — Progress Notes (Signed)
Subjective: [name] is a 48 y.o. 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  Her pain is currently controlled.  She did have to stop the antibiotics because it was causing diarrhea.  She is trying to cut her foot she is wearing a cam boot.  Denies any systemic complaints such as fevers, chills, nausea, vomiting. No calf pain, chest pain, shortness of breath.   Objective: General: No acute distress, AAOx3  DP/PT pulses palpable 2/4, CRT < 3 sec to all digits.  Protective sensation intact. Motor function intact.  The left foot incision is intact to the dorsal IPJ.  Some mild granulation tissue present along the incision.  Incision is closed on the plantar aspect of the hallux.  Upon there is no edema, erythema there is localized erythema around the incision dorsal hallux.  K wire intact of the hallux.  There is erythema and she states that she is very sensitive to a lot of material I did remove the K wire and a small amount of clear drainage expressed but no purulence. No other areas of tenderness to bilateral lower extremities.  No other open lesions or pre-ulcerative lesions.  No pain with calf compression, swelling, warmth, erythema.   Assessment and Plan:  Status post left foot hallux IPJ arthroplasty, mild erythema  -Treatment options discussed including all alternatives, risks, and  -X-rays were obtained and reviewed.  Status post hallux IPJ arthroplasty.  No evidence of osteomyelitis, fracture. -Remove the K wire today.  Clear drainage expressed.  She had GI issues with the clindamycin.  Was switched to Cipro, Augmentin.  Although she has an allergy to other penicillins she has done well with Augmentin.  The Cipro allergy she is not sure if this is a true allergy and she is on multiple medications when she had a rash.  If there is any issue to stop taking this. -Betadine wet-to-dry applied. -We will  have her come back to work today.  I will see her back on Thursday morning to reassess. -Ice/elevation -Pain medication as needed. -Monitor for any clinical signs or symptoms of infection and DVT/PE and directed to call the office immediately should any occur or go to the ER. -Follow-up Thursday morning or sooner if any problems arise. In the meantime, encouraged to call the office with any questions, concerns, change in symptoms.   Celesta Gentile, DPM

## 2019-03-06 NOTE — Progress Notes (Signed)
Subjective: SEE [name] is a 48 y.o. 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  She did start the cipro and Augmentin. She states she actually took an extra dose yesterday.  She does state that she had swelling coming yesterday and increased but after she elevate her foot I did go down.  She is not had any pus or malodor.  She has some fluid-filled bloody drainage.  Denies any systemic complaints such as fevers, chills, nausea, vomiting. No calf pain, chest pain, shortness of breath.   Objective: General: No acute distress, AAOx3  DP/PT pulses palpable 2/4, CRT < 3 sec to all digits.  Protective sensation intact. Motor function intact.  The left foot incision is intact to the dorsal IPJ to the medial, lateral aspect of the central aspect does probe and there is clear, bloody drainage present there is no purulence.  There is no edema to the toe which is about the same.  Erythema appears to be mildly improved.  There is a mark on the foot where the cellulitis was but this has been sent to.  There is also a darker red response to the right breast that was on Tuesday.  There is no fluctuation crepitation.  There is no malodor.  Incision plantar aspect of the hallux is well coapted.  No edema, erythema plantar aspect.      No other open lesions or pre-ulcerative lesions.  No pain with calf compression, swelling, warmth, erythema.   Assessment and Plan:  Status post left foot hallux IPJ arthroplasty, erythema  -Treatment options discussed including all alternatives, risks, and complications -Will continue Augmentin and cipro- discussed to take the antibiotics appropriately.  -I did open the wound on the dorsal aspect of the hallux. No pus but a wound culture was taken today.  -The wound was packed with iodoform followed by Betadine.  I want her to do this daily.  Supplies were given. -She understands that she  is at risk of amputation and we discussed this prior to this surgery.  -Continue cam boot elevation.  Hold off on going to work at this point.  We will see her back on Monday but encourage her good ER should any symptoms worsen.  Trula Slade DPM

## 2019-03-06 NOTE — Addendum Note (Signed)
Addended by: Cranford Mon R on: 03/06/2019 01:40 PM   Modules accepted: Orders

## 2019-03-09 LAB — WOUND CULTURE
MICRO NUMBER:: 584043
RESULT:: NO GROWTH
SPECIMEN QUALITY:: ADEQUATE

## 2019-03-10 ENCOUNTER — Encounter: Payer: Self-pay | Admitting: Podiatry

## 2019-03-10 ENCOUNTER — Other Ambulatory Visit: Payer: Self-pay

## 2019-03-10 ENCOUNTER — Ambulatory Visit (INDEPENDENT_AMBULATORY_CARE_PROVIDER_SITE_OTHER): Payer: PRIVATE HEALTH INSURANCE | Admitting: Podiatry

## 2019-03-10 DIAGNOSIS — L97521 Non-pressure chronic ulcer of other part of left foot limited to breakdown of skin: Secondary | ICD-10-CM

## 2019-03-10 DIAGNOSIS — L03032 Cellulitis of left toe: Secondary | ICD-10-CM

## 2019-03-10 DIAGNOSIS — L02612 Cutaneous abscess of left foot: Secondary | ICD-10-CM
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
## 2019-03-14 ENCOUNTER — Telehealth: Payer: Self-pay | Admitting: Podiatry

## 2019-03-14 NOTE — Telephone Encounter (Signed)
Called to see how she was doing. Left VM to call back if she has any questions or concerns.

## 2019-03-14 NOTE — Telephone Encounter (Signed)
Pt was returning phone call and she stated that she is doing well and that she will see Dr. Jacqualyn Posey on Monday morning at 8:00am

## 2019-03-17 ENCOUNTER — Other Ambulatory Visit: Payer: Self-pay

## 2019-03-17 ENCOUNTER — Encounter: Payer: Self-pay | Admitting: Podiatry

## 2019-03-17 ENCOUNTER — Ambulatory Visit (INDEPENDENT_AMBULATORY_CARE_PROVIDER_SITE_OTHER): Payer: PRIVATE HEALTH INSURANCE | Admitting: Podiatry

## 2019-03-17 ENCOUNTER — Encounter: Payer: PRIVATE HEALTH INSURANCE | Admitting: Podiatry

## 2019-03-17 VITALS — Temp 97.5°F

## 2019-03-17 DIAGNOSIS — L03032 Cellulitis of left toe: Secondary | ICD-10-CM

## 2019-03-17 DIAGNOSIS — L02612 Cutaneous abscess of left foot: Secondary | ICD-10-CM

## 2019-03-17 DIAGNOSIS — L97521 Non-pressure chronic ulcer of other part of left foot limited to breakdown of skin: Secondary | ICD-10-CM

## 2019-03-17 MED ORDER — AMOXICILLIN-POT CLAVULANATE 875-125 MG PO TABS: 1.0000 | ORAL_TABLET | Freq: Two times a day (BID) | ORAL | 0 refills | Status: DC

## 2019-03-17 NOTE — Progress Notes (Signed)
Subjective: [name] is a 48 y.o. 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  Overall she states that she is doing better.  She states that she does not have any significant drainage and the redness is much improved.  She still taking the Augmentin.  She still has some of the rash with Cipro but this is improving.  No red streaks coming from the area.  Denies any fevers, chills, nausea, vomiting.  No calf pain, chest pain, shortness breath.  Objective: General: No acute distress, AAOx3  DP/PT pulses palpable 2/4, CRT < 3 sec to all digits.  Incision appears to be healing well.  The central aspect is more granulation tissue present and appears to be almost healed however I did probe the area today to reveal a small amount of clear drainage but no purulence.  There is substantially improved edema and erythema to the toe.  No increase in warmth.  No ascending cellulitis.  There is no fluctuation crepitation.  At the base of the hyperkeratotic tissue that had formed on the plantar aspect the hallux with a wound was at previously.  No drainage or pus coming from this area. No pain with calf compression, erythema or warmth.  Assessment and Plan:  Status post left foot hallux IPJ arthroplasty, improved erythema  -Treatment options discussed including all alternatives, risks, and complications -I remove the sutures today.  I did put the central aspect of the dorsal digit reveals mild clear drainage but no purulence.  I irrigated this area.  Remove the sutures and upon also debrided the hyperkeratotic tissue.  Cleaned both of the areas and applied Betadine and a dry sterile dressing.  Continue cam boot.  Continue elevate. Keep on light duty at work at desk duty. -Refill Augmentin -We will have her follow-up in 1 week with Dr. Amalia Hailey for wound check.  Please try to probe the dorsal wound to make sure that there is no  drainage.  If she is doing well she can go back to work full duty but will remain in the cam boot.  Trula Slade DPM

## 2019-03-24 ENCOUNTER — Ambulatory Visit (INDEPENDENT_AMBULATORY_CARE_PROVIDER_SITE_OTHER): Payer: PRIVATE HEALTH INSURANCE | Admitting: Podiatry

## 2019-03-24 ENCOUNTER — Other Ambulatory Visit: Payer: Self-pay

## 2019-03-24 ENCOUNTER — Encounter: Payer: Self-pay | Admitting: Podiatry

## 2019-03-24 VITALS — Temp 98.7°F

## 2019-03-24 DIAGNOSIS — E1149 Type 2 diabetes mellitus with other diabetic neurological complication: Secondary | ICD-10-CM

## 2019-03-24 DIAGNOSIS — L03032 Cellulitis of left toe: Secondary | ICD-10-CM

## 2019-03-24 DIAGNOSIS — L02612 Cutaneous abscess of left foot: Secondary | ICD-10-CM

## 2019-03-24 DIAGNOSIS — Z09 Encounter for follow-up examination after completed treatment for conditions other than malignant neoplasm: Secondary | ICD-10-CM

## 2019-03-26 NOTE — Progress Notes (Signed)
   Subjective:  Patient presents today status post IPJ arthrodesis left. DOS: 02/26/2019. He states she is experiencing some drainage that began about four days ago. She states she was on the foot more than she should have been and now has the drainage with associated redness. She is still currently taking the antibiotics as directed. She denies malodor. Patient is here for further evaluation and treatment.   Past Medical History:  Diagnosis Date  . Atrial fibrillation ([place])   . Diabetic retinopathy ([place])   . Diabetic ulcer of right great toe [facility])    "been dealing w/it for ~ 1 yr" (05/20/2018)  . Polycystic ovarian syndrome   . Psoriasis   . Psoriatic arthritis ([place])    "pretty much all over; knees, hands, elbows" (05/20/2018)  . SVT (supraventricular tachycardia) ([place])    "had 1 chemical conversion here in the ER" (05/20/2018)  . Type II diabetes mellitus ([place])       Objective/Physical Exam Neurovascular status intact.  Skin incisions appear to be well coapted with sutures and staples intact. Erythema and edema localized to the left great toe with draining ulceration measuring 0.2 x 0.6 x 0.1 cm.   Assessment: 1. s/p IPJ arthrodesis left. DOS: 02/26/2019 2. Cellulitis left great toe with draining ulceration 3. Ulceration of the dorsal left great toe secondary to diabetes mellitus    Plan of Care:  1. Patient was evaluated.  2. Continue taking Augment as prescribed.  3. Continue weightbearing in CAM boot.  4. Continue changing dry sterile dressing daily.  5. Return to clinic in one week with Dr. Jacqualyn Posey.    Edrick Kins, DPM [facility]  Dr. Edrick Kins, [address]                                        [place], [place] [address]                Office [phone]  Fax [phone]

## 2019-03-31 ENCOUNTER — Ambulatory Visit (INDEPENDENT_AMBULATORY_CARE_PROVIDER_SITE_OTHER): Payer: PRIVATE HEALTH INSURANCE

## 2019-03-31 ENCOUNTER — Ambulatory Visit (INDEPENDENT_AMBULATORY_CARE_PROVIDER_SITE_OTHER): Payer: PRIVATE HEALTH INSURANCE | Admitting: Podiatry

## 2019-03-31 ENCOUNTER — Other Ambulatory Visit: Payer: Self-pay

## 2019-03-31 ENCOUNTER — Encounter: Payer: Self-pay | Admitting: Podiatry

## 2019-03-31 DIAGNOSIS — L02612 Cutaneous abscess of left foot: Secondary | ICD-10-CM

## 2019-03-31 DIAGNOSIS — L03032 Cellulitis of left toe: Secondary | ICD-10-CM

## 2019-03-31 MED ORDER — AMOXICILLIN-POT CLAVULANATE 875-125 MG PO TABS: 1.0000 | ORAL_TABLET | Freq: Two times a day (BID) | ORAL | 0 refills | Status: DC

## 2019-04-02 NOTE — Progress Notes (Signed)
Subjective: [name] is a 48 y.o. 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  She states the wound is doing better.  She states that she changes the bandages daily but she does keep it open at night around the house.  She has not noticed any malodor.  She still taking Augmentin.  She has no pain.  The swelling the redness is improved. No red streaks.  She is not noticed any redness except at the top of the big toe .Denies any fevers, chills, nausea, vomiting.  No calf pain, chest pain, shortness breath.  Objective: General: No acute distress, AAOx3  DP/PT pulses palpable 2/4, CRT < 3 sec to all digits.  The wound on the plantar aspect of the hallux is completely healed.  Incision the dorsal aspect the hallux is healed except for one central area with some superficial granular tissue however was able to probe the area today and some clear drainage was expressed but again there is no purulence.  There is improved edema is improved erythema and there is no ascending cellulitis.  There is no significant increase in warmth.  The erythema is blanchable. No pain with calf compression, erythema or warmth.        Assessment and Plan:  Status post left foot hallux IPJ arthroplasty, improved erythema  -Treatment options discussed including all alternatives, risks, and complications -X-rays were obtained reviewed.  There is cortical changes present in the proximal phalanx which could be postsurgical versus early osteomyelitis.  There is no soft tissue emphysema.  There is dorsal subluxation of the distal phalanx.  Clinically the toe is is in a rectus position.  Given continued erythema as well as x-ray findings were going to continue Augmentin.  This was refilled today.  Continue with dressing changes.  Continue with offloading.  I want to keep her on desk duty.  Encouraged elevation.  Trula Slade DPM

## 2019-04-07 ENCOUNTER — Ambulatory Visit (INDEPENDENT_AMBULATORY_CARE_PROVIDER_SITE_OTHER): Payer: Self-pay | Admitting: Podiatry

## 2019-04-07 ENCOUNTER — Encounter: Payer: Self-pay | Admitting: Podiatry

## 2019-04-07 ENCOUNTER — Other Ambulatory Visit: Payer: Self-pay

## 2019-04-07 VITALS — Temp 97.2°F

## 2019-04-07 DIAGNOSIS — L02612 Cutaneous abscess of left foot: Secondary | ICD-10-CM

## 2019-04-07 DIAGNOSIS — L03032 Cellulitis of left toe: Secondary | ICD-10-CM

## 2019-04-07 NOTE — Progress Notes (Signed)
Subjective: [name] is a 48 y.o. 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  She states that she was doing well.  She did finish the antibiotics and she did not pick up the new prescription over the weekend that she has been off of antibiotic last couple of days.  She states that she gets a very minimal amount of viscous drainage from the wound of the top of the toe but no pus.  She states that the redness is continuing to improve as well.  No pain.  Denies any fevers, chills, nausea, vomiting.  No cramping, chest pain, shortness of breath.  Objective: General: No acute distress, AAOx3  DP/PT pulses palpable 2/4, CRT < 3 sec to all digits.  The wound on the plantar aspect of the hallux is completely healed.  There is still a small opening on the dorsal aspect of the hallux which probes approximate 0.5 cm.  Only blood was expressed today and there is no purulence or clear drainage.  There is still some mild edema and erythema to the hallux and the erythema is only on the dorsal aspect.  There is no increased warmth.  The erythema is blanchable.  There is no fluctuation potation.   Assessment and Plan:  Status post left foot hallux IPJ arthroplasty, improved erythema  -Treatment options discussed including all alternatives, risks, and complications -I did probe the wound today there is no clear drainage expressed.  No purulence.  On the low back on antibiotics.  Continue with daily dressing changes. -Continue cam boot, elevation. -Remain on desk duty for now. -Monitor for any clinical signs or symptoms of infection and directed to call the office immediately should any occur or go to the ER.  RTC 1 week or sooner if needed  Trula Slade DPM

## 2019-04-14 ENCOUNTER — Encounter: Payer: Self-pay | Admitting: Podiatry

## 2019-04-14 ENCOUNTER — Ambulatory Visit (INDEPENDENT_AMBULATORY_CARE_PROVIDER_SITE_OTHER): Payer: PRIVATE HEALTH INSURANCE | Admitting: Podiatry

## 2019-04-14 ENCOUNTER — Other Ambulatory Visit: Payer: Self-pay

## 2019-04-14 VITALS — Temp 97.7°F

## 2019-04-14 DIAGNOSIS — L02612 Cutaneous abscess of left foot: Secondary | ICD-10-CM

## 2019-04-14 DIAGNOSIS — L97521 Non-pressure chronic ulcer of other part of left foot limited to breakdown of skin: Secondary | ICD-10-CM

## 2019-04-14 DIAGNOSIS — L03032 Cellulitis of left toe: Secondary | ICD-10-CM

## 2019-04-14 NOTE — Progress Notes (Signed)
Subjective: [name] is a 48 y.o. 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  She presents today for follow-up evaluation of the wound, cellulitis.  She states that she did not pick up the antibiotics until Thursday.  She states that she took 4 of the antibiotics each day over the weekend.  She is very minimal clear drainage but no pus.  No pain. Denies any fevers, chills, nausea, vomiting.  No cramping, chest pain, shortness of breath.  Objective: General: No acute distress, AAOx3  DP/PT pulses palpable 2/4, CRT < 3 sec to all digits.  The wound on the plantar aspect of the hallux is completely healed.  There is a small scab on the dorsal aspect of the hallux of the prior incision.  I did debride this today and only bloody drainage was expressed.  There is no clear, pus or any other drainage identified.  There is still some mild edema erythema to the hallux.  No ascending cellulitis.  No increase in warmth of the toe.  Erythema is blanchable. There is no fluctuation crepitation.  Assessment and Plan:  Status post left foot hallux IPJ arthroplasty, improved erythema  -Treatment options discussed including all alternatives, risks, and complications -I debrided the area today with any complications only some bloody drainage is expressed.  Area is irrigated with saline.  Antibiotic ointment and dressings applied.  Continue daily dressing changes.  Continue with surgical boot.  We will continue light duty for now.  Elevation. -Monitor for any clinical signs or symptoms of infection and directed to call the office immediately should any occur or go to the ER. -RTC 1 week or sooner if needed  Trula Slade DPM

## 2019-04-21 ENCOUNTER — Encounter: Payer: Self-pay | Admitting: Podiatry

## 2019-04-21 ENCOUNTER — Other Ambulatory Visit: Payer: Self-pay

## 2019-04-21 ENCOUNTER — Ambulatory Visit (INDEPENDENT_AMBULATORY_CARE_PROVIDER_SITE_OTHER): Payer: Self-pay | Admitting: Podiatry

## 2019-04-21 VITALS — Temp 97.9°F

## 2019-04-21 DIAGNOSIS — L97521 Non-pressure chronic ulcer of other part of left foot limited to breakdown of skin: Secondary | ICD-10-CM

## 2019-04-21 DIAGNOSIS — L03032 Cellulitis of left toe: Secondary | ICD-10-CM

## 2019-04-21 DIAGNOSIS — L02612 Cutaneous abscess of left foot: Secondary | ICD-10-CM

## 2019-04-21 NOTE — Progress Notes (Signed)
Subjective: [name] is a 48 y.o. 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  Overall she states that she is doing better.  However she is only taking 1 antibiotic a day because of GI issues.  She is able to tolerate that. She is going to restart taking the antibiotic as ordered.  Denies any fevers, chills, nausea, vomiting.  No cramping, chest pain, shortness of breath.  Objective: General: No acute distress, AAOx3  DP/PT pulses palpable 2/4, CRT < 3 sec to all digits.  The wound on the plantar aspect of the hallux is completely healed.  There is a small scab on the dorsal aspect of the hallux over the prior incision.  Is able to have the same only bloody drainage expressed and there is no purulence.  There is decreased edema.  Mild erythema of the dorsal aspect.  This is improved as well.  Appears to be dark color today.  No fluctuation or crepitance. No malodor. No warmth.  No pain with calf compression, swelling, warmth, erythema  Assessment and Plan:  Status post left foot hallux IPJ arthroplasty, improved erythema  -Treatment options discussed including all alternatives, risks, and complications -Overall the wound is doing better and the infection is improving. She is going to start taking the medication as ordered. If she is not able to tolerate the medication to let me know and will change.  -Continue the CAM boot -Elevation -Will keep her on light duty for now.  -Monitor for any clinical signs or symptoms of infection and directed to call the office immediately should any occur or go to the ER.  Return in about 1 week (around 04/28/2019).  Trula Slade DPM

## 2019-04-28 ENCOUNTER — Encounter: Payer: Self-pay | Admitting: Podiatry

## 2019-04-28 ENCOUNTER — Other Ambulatory Visit: Payer: Self-pay

## 2019-04-28 ENCOUNTER — Ambulatory Visit (INDEPENDENT_AMBULATORY_CARE_PROVIDER_SITE_OTHER): Payer: PRIVATE HEALTH INSURANCE | Admitting: Podiatry

## 2019-04-28 VITALS — Temp 97.7°F

## 2019-04-28 DIAGNOSIS — L97521 Non-pressure chronic ulcer of other part of left foot limited to breakdown of skin: Secondary | ICD-10-CM

## 2019-04-28 DIAGNOSIS — E1149 Type 2 diabetes mellitus with other diabetic neurological complication: Secondary | ICD-10-CM

## 2019-04-28 NOTE — Progress Notes (Signed)
Subjective: [name] is a 48 y.o. 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 She states she is doing "great".   She states that she is doing much better and the wound is healed.  The redness is resolved.  The swelling is also much improved.  No pain.  She did wear shoe this week and regular walking and yard without any issues.  She has no new concerns.  She is still taking antibiotics 1 pill a day.  No further GI issues. Denies any fevers, chills, nausea, vomiting.  No cramping, chest pain, shortness of breath.  Objective: General: No acute distress, AAOx3  DP/PT pulses palpable 2/4, CRT < 3 sec to all digits.  The wound on the plantar aspect of the hallux is completely healed.  It i incision on the dorsal aspect of the hallux is healed as well.  There is no significant erythema there is no warmth.  Minimal edema.  No fluctuation or crepitation.  No malodor.   No pain with calf compression, swelling, warmth, erythema  Assessment and Plan:  Status post left foot hallux IPJ arthroplasty, much improvement   -Treatment options discussed including all alternatives, risks, and complications -Clinically she is doing much better.  Discussed taking antibiotics as prescribed.  She not able to tolerate oral to stop antibiotics at this point.  She can transition to a surgical shoe during the day.  Discussed that we transition back into a regular shoe.  I will keep her on light duty this week and next week will start to increase her activity. Monitoring signs or symptoms of recurrent infection of wounds.  RTC 2 weeks  Trula Slade DPM

## 2019-05-05 ENCOUNTER — Encounter: Payer: PRIVATE HEALTH INSURANCE | Admitting: Podiatry

## 2019-05-12 ENCOUNTER — Other Ambulatory Visit: Payer: Self-pay

## 2019-05-12 ENCOUNTER — Other Ambulatory Visit: Payer: Self-pay | Admitting: Podiatry

## 2019-05-12 ENCOUNTER — Ambulatory Visit (INDEPENDENT_AMBULATORY_CARE_PROVIDER_SITE_OTHER): Payer: Self-pay | Admitting: Podiatry

## 2019-05-12 ENCOUNTER — Encounter: Payer: Self-pay | Admitting: Podiatry

## 2019-05-12 VITALS — Temp 97.7°F

## 2019-05-12 DIAGNOSIS — L97521 Non-pressure chronic ulcer of other part of left foot limited to breakdown of skin: Secondary | ICD-10-CM

## 2019-05-12 DIAGNOSIS — L02612 Cutaneous abscess of left foot: Secondary | ICD-10-CM

## 2019-05-12 DIAGNOSIS — L03032 Cellulitis of left toe: Secondary | ICD-10-CM

## 2019-05-12 NOTE — Progress Notes (Signed)
Subjective: [name] is a 48 y.o. 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  She still in the cam boot.  She feels that the wounds have all healed and having no pain.  No open sores and she denies any swelling or redness.  She has no longer taking any antibiotics.  She has no concerns today.  Denies any fevers, chills, nausea, vomiting.  No calf pain, chest pain, shortness of breath.  Objective: General: No acute distress, AAOx3  DP/PT pulses palpable 2/4, CRT < 3 sec to all digits.  The wound on the plantar aspect of the hallux is completely healed. The incision on the dorsal aspect of the hallux is healed.  Minimal swelling but there is no significant erythema or warmth.  There is no open sores. No pain with calf compression, swelling, warmth, erythema  Assessment and Plan:  Status post left foot hallux IPJ arthroplasty, much improvement   -Treatment options discussed including all alternatives, risks, and complications -She is doing much better.  No active signs of infection today and she is off of antibiotics.  Discussed with her slowly transition to regular shoe.  Elevation.  Would continue on light duty for now return to normal work next week if able.  Note was provided. -Monitor for any clinical signs or symptoms of infection and directed to call the office immediately should any occur or go to the ER.  Return in about 2 weeks (around 05/26/2019).  Trula Slade DPM

## 2019-05-23 ENCOUNTER — Encounter: Payer: PRIVATE HEALTH INSURANCE | Admitting: Podiatry

## 2019-05-27 ENCOUNTER — Encounter: Payer: Self-pay | Admitting: Podiatry

## 2019-05-27 ENCOUNTER — Ambulatory Visit (INDEPENDENT_AMBULATORY_CARE_PROVIDER_SITE_OTHER): Payer: PRIVATE HEALTH INSURANCE | Admitting: Podiatry

## 2019-05-27 ENCOUNTER — Other Ambulatory Visit: Payer: Self-pay

## 2019-05-27 DIAGNOSIS — L97521 Non-pressure chronic ulcer of other part of left foot limited to breakdown of skin: Secondary | ICD-10-CM

## 2019-05-27 DIAGNOSIS — E1149 Type 2 diabetes mellitus with other diabetic neurological complication: Secondary | ICD-10-CM

## 2019-05-27 NOTE — Progress Notes (Signed)
Subjective: [name] is a 48 y.o. 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  Overall she states that she is doing very well.  She had no problems with the toe.  She did pull a piece of toenail off causing a small wound on top of the toe appears to be healing.  Denies any drainage or pus.  She has no fevers, chills, nausea, vomiting.No calf pain, chest pain, shortness of breath.  Objective: General: No acute distress, AAOx3  DP/PT pulses palpable 2/4, CRT < 3 sec to all digits.  The wound on the plantar aspect of the hallux is completely healed.  The incision on the dorsal aspect of the foot is healed as well.  There is no edema, erythema to the toe.  Just adjacent to the toenail there is a superficial laceration by debridement of bone from where she pulled a piece of toenail.  There is no signs of infection.  No other lesions identified. No pain with calf compression, swelling, warmth, erythema  Assessment and Plan:  Status post left foot hallux IPJ arthroplasty, much improvement ; abrasion hallux  -Treatment options discussed including all alternatives, risks, and complications -From surgical standpoint she is doing well.  The wounds are healed there is no signs of active infection.  She is been off antibiotics now for over 2 weeks.  Continue to monitor for any signs or symptoms of infection.  At this point we will discharge her from postoperative care.  Recommend a small meta antibiotic ointment and some changes to the abrasion on the top of the toe.  Recommend her not to take the ingrown toenail herself.  Monitoring signs or symptoms of infection.  I will see back in 4 weeks or sooner if any issues are to arise.  Trula Slade DPM

## 2019-06-23 ENCOUNTER — Ambulatory Visit (INDEPENDENT_AMBULATORY_CARE_PROVIDER_SITE_OTHER): Payer: PRIVATE HEALTH INSURANCE | Admitting: Podiatry

## 2019-06-23 ENCOUNTER — Telehealth: Payer: Self-pay | Admitting: *Deleted

## 2019-06-23 ENCOUNTER — Encounter: Payer: Self-pay | Admitting: Podiatry

## 2019-06-23 ENCOUNTER — Other Ambulatory Visit: Payer: Self-pay

## 2019-06-23 DIAGNOSIS — Z872 Personal history of diseases of the skin and subcutaneous tissue: Secondary | ICD-10-CM | POA: Diagnosis not present

## 2019-06-23 DIAGNOSIS — Z89419 Acquired absence of unspecified great toe: Secondary | ICD-10-CM | POA: Diagnosis not present

## 2019-06-23 DIAGNOSIS — E1149 Type 2 diabetes mellitus with other diabetic neurological complication: Secondary | ICD-10-CM

## 2019-06-23 NOTE — Telephone Encounter (Signed)
Called and left a message for the patient to call me back concerning the FMLA paper work. [name]

## 2019-06-23 NOTE — Patient Instructions (Signed)
Diabetes Mellitus and Foot Care Foot care is an important part of your health, especially when you have diabetes. Diabetes may cause you to have problems because of poor blood flow (circulation) to your feet and legs, which can cause your skin to:  Become thinner and drier.  Break more easily.  Heal more slowly.  Peel and crack. You may also have nerve damage (neuropathy) in your legs and feet, causing decreased feeling in them. This means that you may not notice minor injuries to your feet that could lead to more serious problems. Noticing and addressing any potential problems early is the best way to prevent future foot problems. How to care for your feet Foot hygiene  Wash your feet daily with warm water and mild soap. Do not use hot water. Then, pat your feet and the areas between your toes until they are completely dry. Do not soak your feet as this can dry your skin.  Trim your toenails straight across. Do not dig under them or around the cuticle. File the edges of your nails with an emery board or nail file.  Apply a moisturizing lotion or petroleum jelly to the skin on your feet and to dry, brittle toenails. Use lotion that does not contain alcohol and is unscented. Do not apply lotion between your toes. Shoes and socks  Wear clean socks or stockings every day. Make sure they are not too tight. Do not wear knee-high stockings since they may decrease blood flow to your legs.  Wear shoes that fit properly and have enough cushioning. Always look in your shoes before you put them on to be sure there are no objects inside.  To break in new shoes, wear them for just a few hours a day. This prevents injuries on your feet. Wounds, scrapes, corns, and calluses  Check your feet daily for blisters, cuts, bruises, sores, and redness. If you cannot see the bottom of your feet, use a mirror or ask someone for help.  Do not cut corns or calluses or try to remove them with medicine.  If you  find a minor scrape, cut, or break in the skin on your feet, keep it and the skin around it clean and dry. You may clean these areas with mild soap and water. Do not clean the area with peroxide, alcohol, or iodine.  If you have a wound, scrape, corn, or callus on your foot, look at it several times a day to make sure it is healing and not infected. Check for: ? Redness, swelling, or pain. ? Fluid or blood. ? Warmth. ? Pus or a bad smell. General instructions  Do not cross your legs. This may decrease blood flow to your feet.  Do not use heating pads or hot water bottles on your feet. They may burn your skin. If you have lost feeling in your feet or legs, you may not know this is happening until it is too late.  Protect your feet from hot and cold by wearing shoes, such as at the beach or on hot pavement.  Schedule a complete foot exam at least once a year (annually) or more often if you have foot problems. If you have foot problems, report any cuts, sores, or bruises to your health care provider immediately. Contact a health care provider if:  You have a medical condition that increases your risk of infection and you have any cuts, sores, or bruises on your feet.  You have an injury that is not   healing.  You have redness on your legs or feet.  You feel burning or tingling in your legs or feet.  You have pain or cramps in your legs and feet.  Your legs or feet are numb.  Your feet always feel cold.  You have pain around a toenail. Get help right away if:  You have a wound, scrape, corn, or callus on your foot and: ? You have pain, swelling, or redness that gets worse. ? You have fluid or blood coming from the wound, scrape, corn, or callus. ? Your wound, scrape, corn, or callus feels warm to the touch. ? You have pus or a bad smell coming from the wound, scrape, corn, or callus. ? You have a fever. ? You have a red line going up your leg. Summary  Check your feet every day  for cuts, sores, red spots, swelling, and blisters.  Moisturize feet and legs daily.  Wear shoes that fit properly and have enough cushioning.  If you have foot problems, report any cuts, sores, or bruises to your health care provider immediately.  Schedule a complete foot exam at least once a year (annually) or more often if you have foot problems. This information is not intended to replace advice given to you by your health care provider. Make sure you discuss any questions you have with your health care provider. Document Released: 09/01/2000 Document Revised: 10/17/2017 Document Reviewed: 10/06/2016 Elsevier Patient Education  2020 [employer]  

## 2019-06-25 NOTE — Progress Notes (Signed)
Subjective: [name] is a 48 y.o. 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 She is doing well. No pain, swelling or open sores. Incision is well healed. She is wearing a diabetic shoe without issues.  Denies any drainage or pus.  She has no fevers, chills, nausea, vomiting.No calf pain, chest pain, shortness of breath.  Objective: General: No acute distress, AAOx3  DP/PT pulses palpable 2/4, CRT < 3 sec to all digits.  The wound on the plantar and the dorsal aspect of the hallux is completely healed.  No edema, erythema, signs of infection. There are no open lesions bilaterally. No pain with calf compression, swelling, warmth, erythema  Assessment and Plan:  Status post left foot hallux IPJ arthroplasty, healed  -Treatment options discussed including all alternatives, risks, and complications -From surgical standpoint she is doing well and the wounds have all healed. No new ulcerations present. As a courtesy I derided the nails today without complications or bleeding. Discussed the importance of daily foot inspection. Recommend every 3 month follow up.   She is going to return to work and able to room for 2 providers (she is a [employer])  Trula Slade DPM

## 2019-07-16 ENCOUNTER — Encounter: Payer: Self-pay | Admitting: Podiatry

## 2019-08-08 IMAGING — DX DG TOE GREAT 2+V*R*
3 series · 3 of 3 positions shown · non-contrast
Comparison: None.

CLINICAL DATA: Worsening diabetic foot ulcer.

EXAM:
RIGHT GREAT TOE

[toe ap]
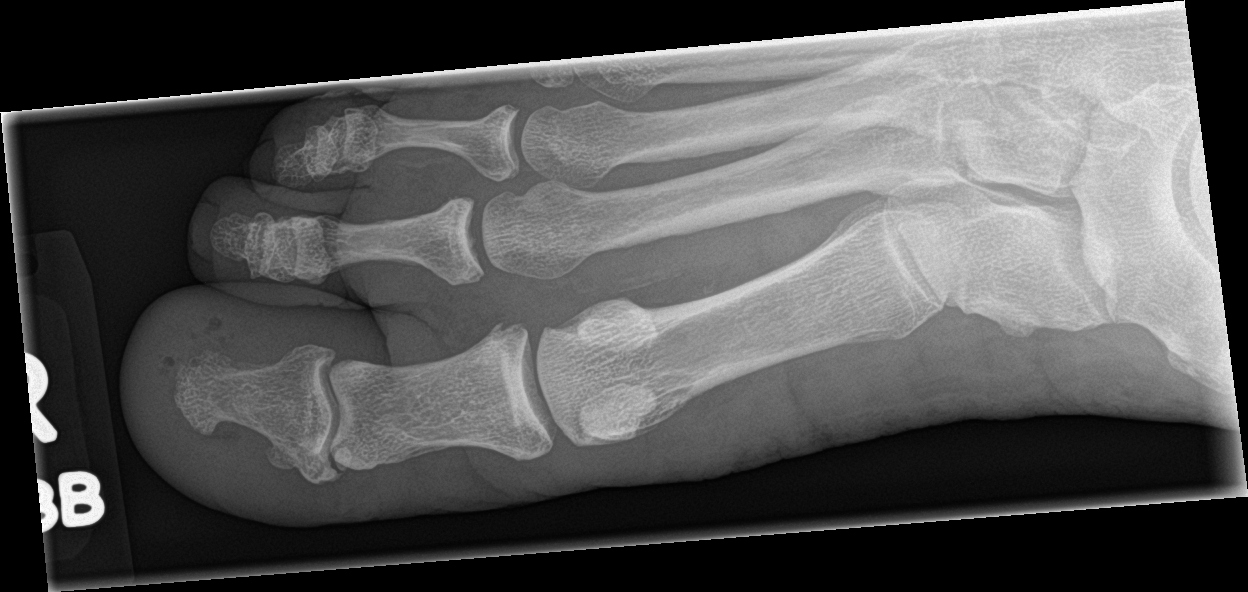

[toe obl]
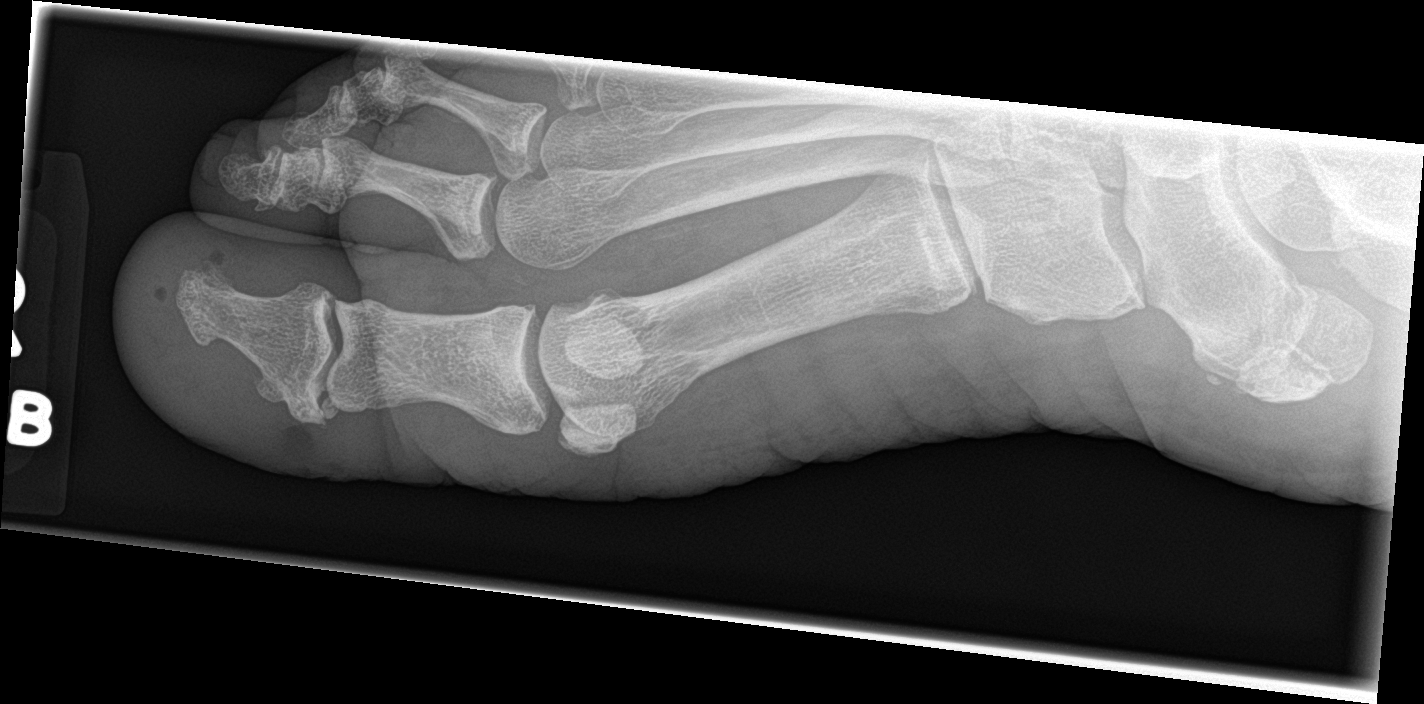

[toe lat]
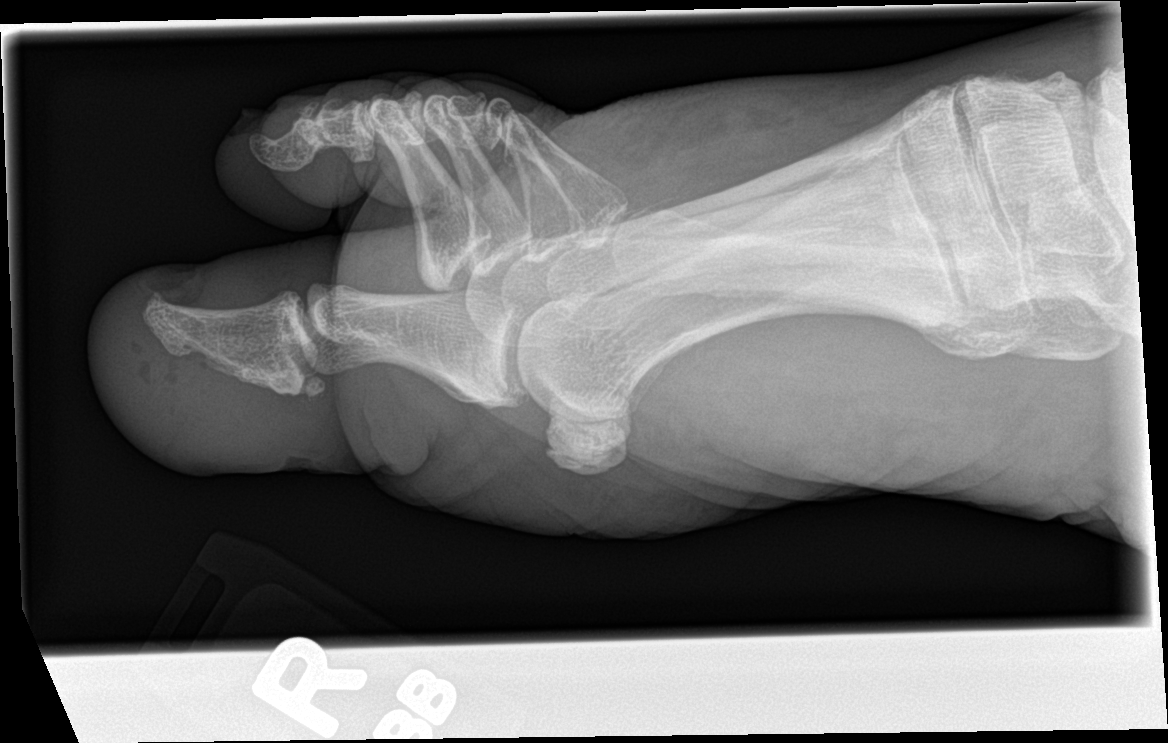

[3 of 3 positions shown; findings below may reference images not displayed]

FINDINGS: There soft tissue swelling of the right great toe with plantar soft
tissue ulceration noted. Speckled soft tissue emphysema is seen
along the plantar aspect of the great toe at the level of the tuft.
Subtle osteopenic appearance of the tuft is noted more so along the
lateral aspect and the possibility of osteomyelitis is not excluded.
No joint dislocation or acute fracture. Mild soft tissue swelling
over dorsum of the forefoot is also present.
IMPRESSION: 1. Soft tissue swelling of the great toe with plantar soft tissue
ulceration.
2. Soft tissue emphysema is identified of the great toe adjacent to
the tuft.
3. Subtle osteopenic appearance involving the lateral aspect of the
tuft of the great toe is identified. Findings raise suspicion for
subtle changes of osteomyelitis.

## 2019-08-10 IMAGING — DX DG FOOT 2V*R*
2 series · 2 of 2 positions shown · non-contrast
Comparison: MRI 05/21/2018

CLINICAL DATA: Postop check

EXAM:
RIGHT FOOT - 2 VIEW

[foot ap]
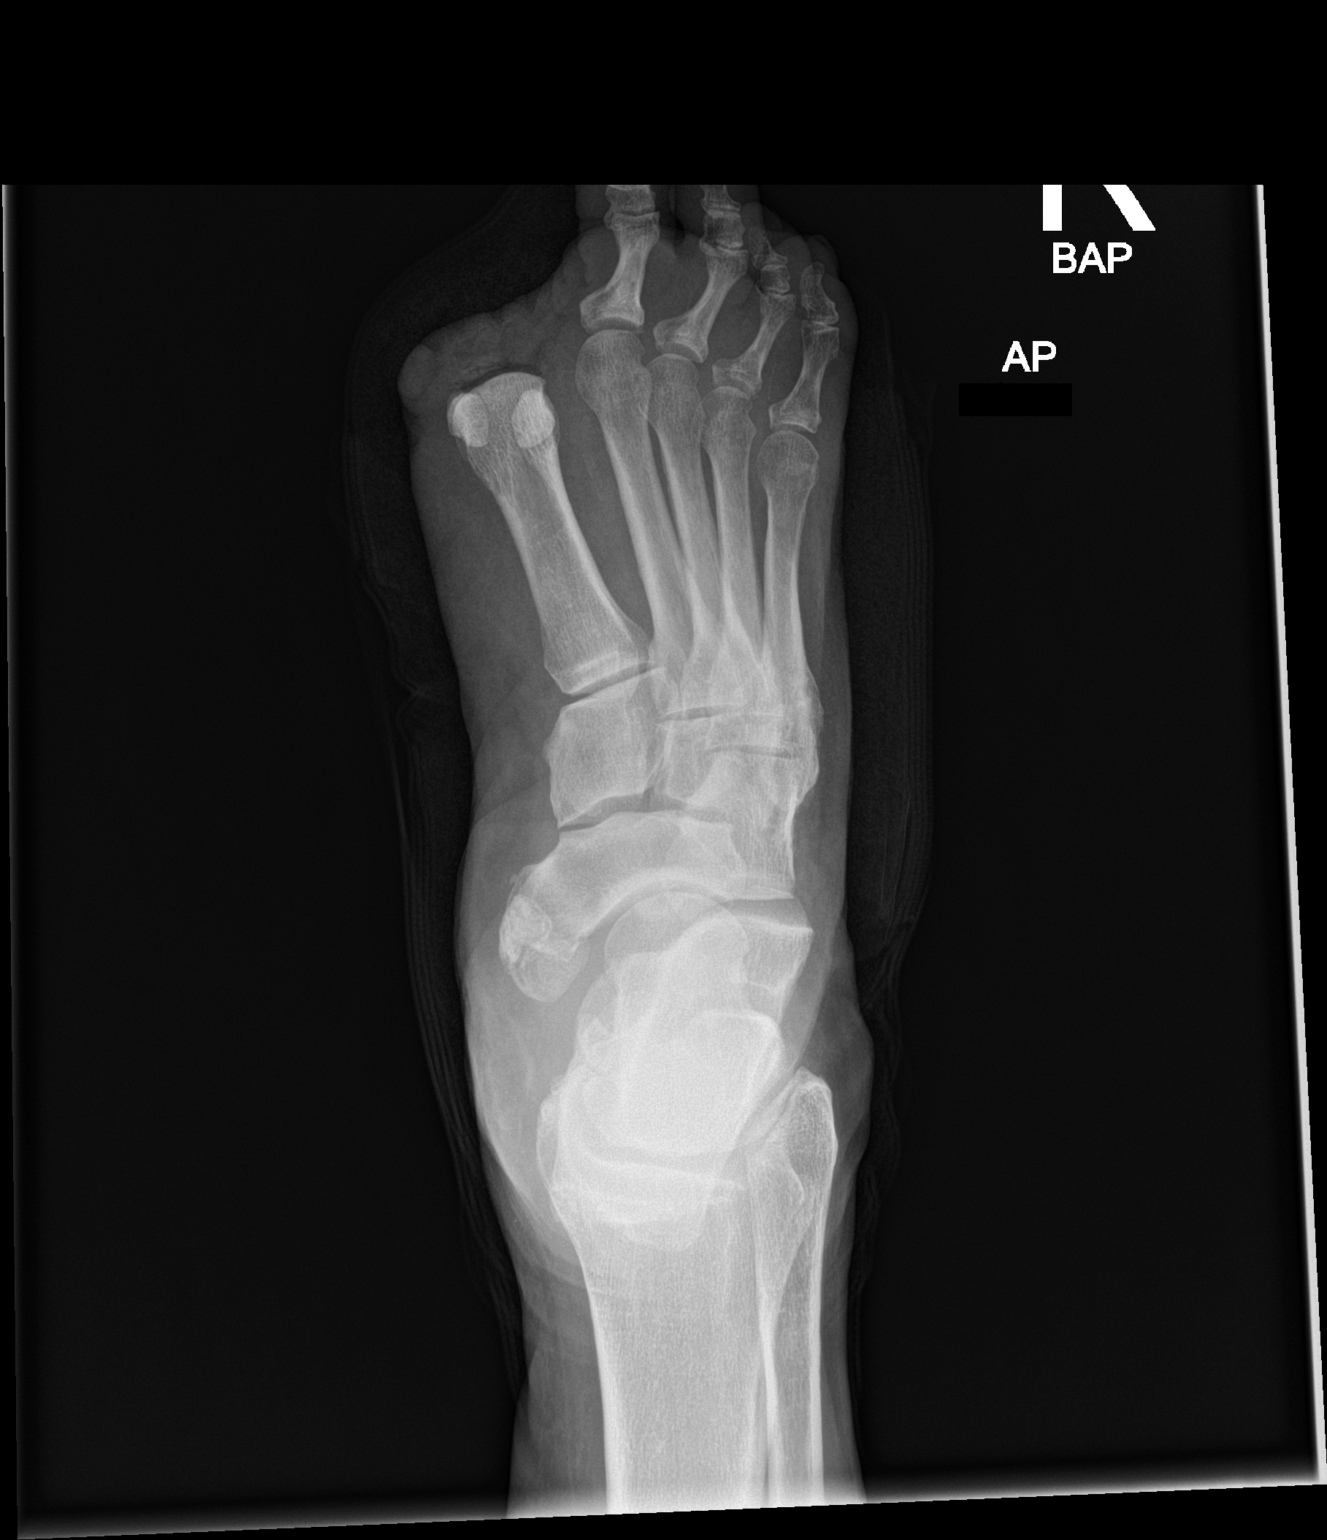

[foot lat]
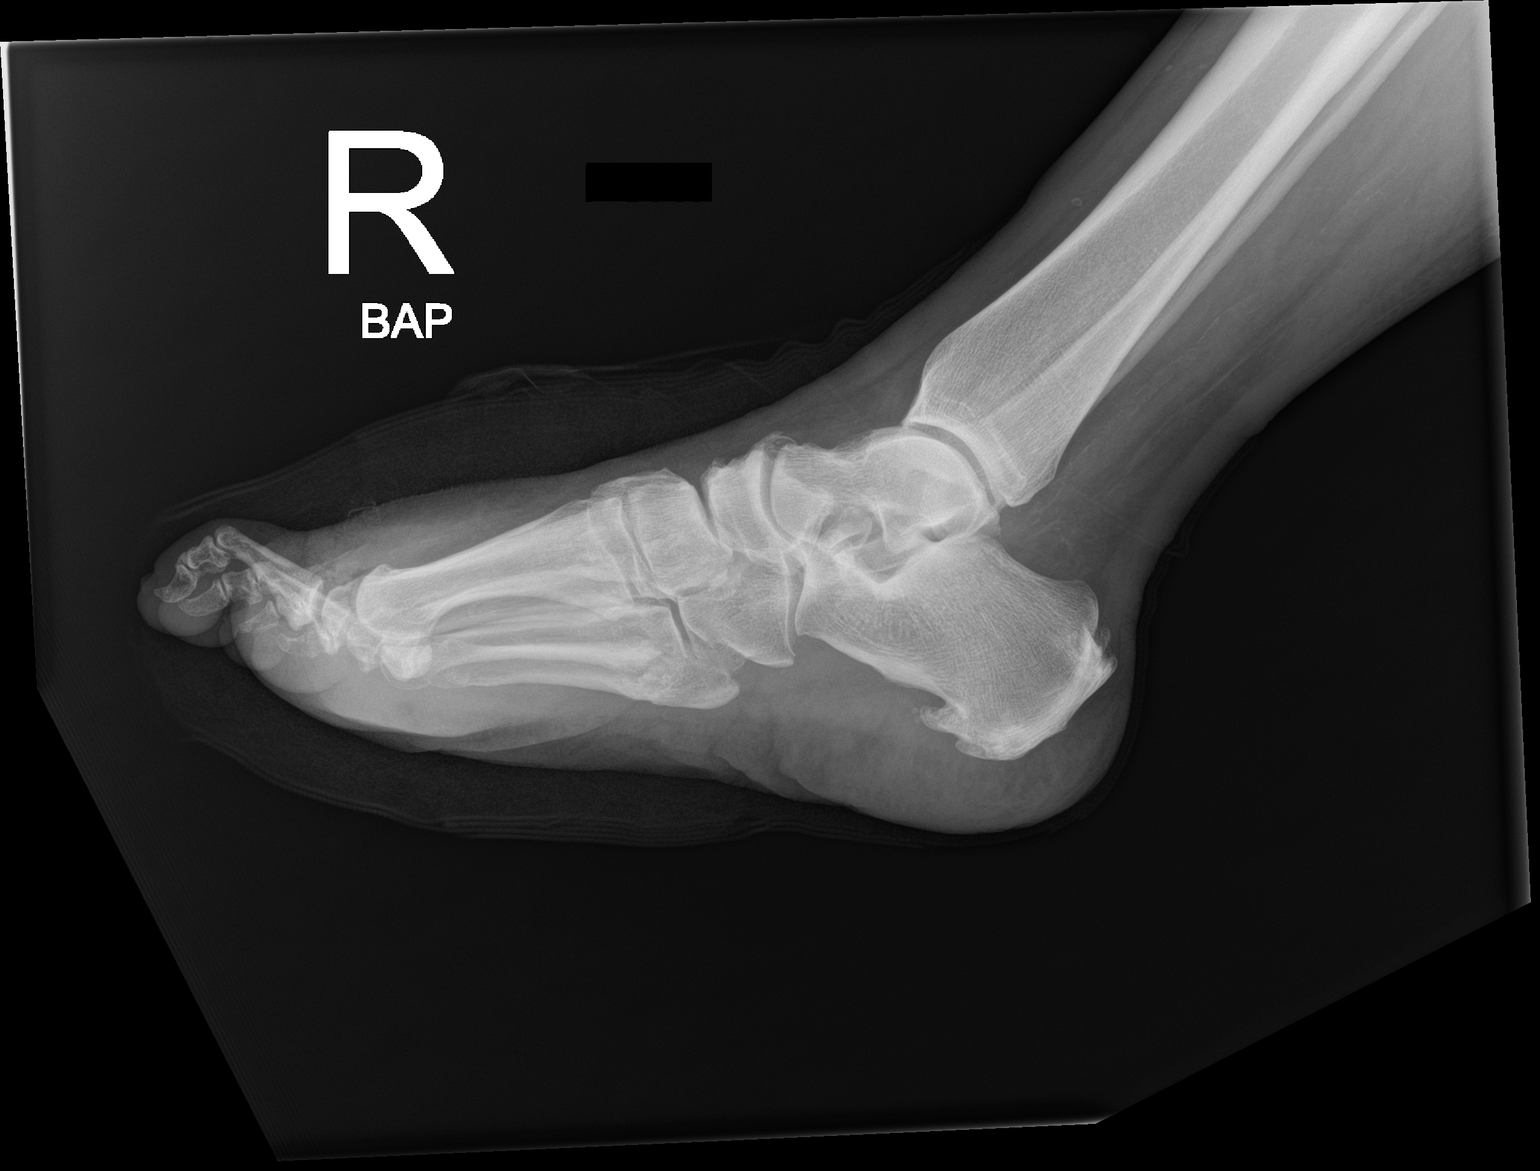

[2 of 2 positions shown; findings below may reference images not displayed]

FINDINGS: Amputation of the great toe at the first MTP joint without immediate
postoperative complicating features identified since recent
comparison MRI. Calcaneal enthesopathy is seen along plantar and
dorsal aspect. Degenerative spurring is noted across the dorsum of
the midfoot. No joint effusion, malalignment nor bone destruction.
Forefoot soft tissue swelling is identified. Osteoarthritis of the
second through fifth DIP and PIP joints with joint space narrowing
and minimal sclerosis. Prominent accessory ossicle adjacent to the
tarsal navicular.
IMPRESSION: No immediate complications status post amputation of the great toe
at the first MTP level.

## 2019-09-22 ENCOUNTER — Encounter: Payer: Self-pay | Admitting: Podiatry

## 2019-09-22 ENCOUNTER — Other Ambulatory Visit: Payer: Self-pay

## 2019-09-22 ENCOUNTER — Ambulatory Visit (INDEPENDENT_AMBULATORY_CARE_PROVIDER_SITE_OTHER): Payer: PRIVATE HEALTH INSURANCE | Admitting: Podiatry

## 2019-09-22 DIAGNOSIS — Z872 Personal history of diseases of the skin and subcutaneous tissue: Secondary | ICD-10-CM | POA: Diagnosis not present

## 2019-09-22 DIAGNOSIS — E1149 Type 2 diabetes mellitus with other diabetic neurological complication: Secondary | ICD-10-CM | POA: Diagnosis not present

## 2019-09-22 DIAGNOSIS — Z89419 Acquired absence of unspecified great toe: Secondary | ICD-10-CM | POA: Diagnosis not present

## 2019-09-22 DIAGNOSIS — E119 Type 2 diabetes mellitus without complications: Secondary | ICD-10-CM | POA: Diagnosis not present

## 2019-09-22 DIAGNOSIS — M79672 Pain in left foot: Secondary | ICD-10-CM

## 2019-09-22 NOTE — Progress Notes (Signed)
Subjective: 49 year old female presents the office today for diabetic foot evaluation.  She states that she is doing well and she has no new concerns.  Denies any open sores or any swelling.  She does have dry skin but uses moisturizer. Denies any systemic complaints such as fevers, chills, nausea, vomiting. No acute changes since last appointment, and no other complaints at this time.   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  Hammertoe contractures present without any skin breakdown.  The nails appear to be hypertrophic, dystrophic with discoloration. No signs of infection to the toenail sites.  No open lesoins No pain with calf compression, swelling, warmth, erythema  Assessment: Diabetic foot exam  Plan: -All treatment options discussed with the patient including all alternatives, risks, complications.  -Nails debrided x 9 without complications or bleeding -Moisturizer daily -Monitor feet daily for any changes.  -Patient encouraged to call the office with any questions, concerns, change in symptoms.   RTC 3 months or sooner if needed  Trula Slade DPM

## 2019-10-13 ENCOUNTER — Other Ambulatory Visit: Payer: Self-pay | Admitting: Nurse Practitioner

## 2019-10-13 DIAGNOSIS — Z1231 Encounter for screening mammogram for malignant neoplasm of breast: Secondary | ICD-10-CM

## 2019-11-20 ENCOUNTER — Ambulatory Visit: Payer: PRIVATE HEALTH INSURANCE

## 2019-12-11 ENCOUNTER — Ambulatory Visit
Admission: RE | Admit: 2019-12-11 | Discharge: 2019-12-11 | Disposition: A | Discharge status: Home / Self Care | Payer: PRIVATE HEALTH INSURANCE | Source: Ambulatory Visit | Attending: Nurse Practitioner | Admitting: Nurse Practitioner

## 2019-12-11 ENCOUNTER — Other Ambulatory Visit: Payer: Self-pay | Admitting: Nurse Practitioner

## 2019-12-11 ENCOUNTER — Other Ambulatory Visit: Payer: Self-pay

## 2019-12-11 DIAGNOSIS — Z1231 Encounter for screening mammogram for malignant neoplasm of breast: Secondary | ICD-10-CM

## 2019-12-22 ENCOUNTER — Ambulatory Visit: Payer: PRIVATE HEALTH INSURANCE | Admitting: Podiatry

## 2019-12-23 ENCOUNTER — Other Ambulatory Visit: Payer: Self-pay

## 2019-12-23 ENCOUNTER — Ambulatory Visit (INDEPENDENT_AMBULATORY_CARE_PROVIDER_SITE_OTHER): Payer: PRIVATE HEALTH INSURANCE | Admitting: Podiatry

## 2019-12-23 ENCOUNTER — Encounter: Payer: Self-pay | Admitting: Podiatry

## 2019-12-23 VITALS — Temp 97.3°F

## 2019-12-23 DIAGNOSIS — E1149 Type 2 diabetes mellitus with other diabetic neurological complication: Secondary | ICD-10-CM

## 2019-12-23 DIAGNOSIS — M79674 Pain in right toe(s): Secondary | ICD-10-CM

## 2019-12-23 DIAGNOSIS — L989 Disorder of the skin and subcutaneous tissue, unspecified: Secondary | ICD-10-CM | POA: Diagnosis not present

## 2019-12-23 DIAGNOSIS — B351 Tinea unguium: Secondary | ICD-10-CM

## 2019-12-23 NOTE — Progress Notes (Signed)
Subjective: 49 y.o. returns the office today for painful, elongated, thickened toenails which they cannot trim herself and for a callus on the left heel.  Otherwise she is been doing well.  She denies any open sores.  No increase in swelling or redness.Denies any systemic complaints such as fevers, chills, nausea, vomiting.   PCP: Lavone Orn, MD  Objective: AAO 3, NAD DP/PT pulses palpable, CRT less than 3 seconds Protective sensation decreased with Simms Weinstein monofilament Nails hypertrophic, dystrophic, elongated, brittle, discolored 9. There is tenderness overlying the nails 2-5 bilaterally.  Amputation site is well-healed on the right side.  No nails present in the left side.  Hyperkeratotic lesion left plantar medial heel without any underlying ulceration drainage or signs of infection. No pain with calf compression, swelling, warmth, erythema.  Assessment: Patient presents with symptomatic onychomycosis; hyperkeratotic lesion was  Plan: -Treatment options including alternatives, risks, complications were discussed -Nails sharply debrided 8 without complication/bleeding. -Hyperkeratotic lesions are debrided x1 without any complications or bleeding -Discussed daily foot inspection. If there are any changes, to call the office immediately.  -Follow-up in 3 months or sooner if any problems are to arise. In the meantime, encouraged to call the office with any questions, concerns, changes symptoms.  Celesta Gentile, DPM

## 2020-01-06 ENCOUNTER — Ambulatory Visit: Payer: PRIVATE HEALTH INSURANCE | Admitting: Orthotics

## 2020-01-06 ENCOUNTER — Other Ambulatory Visit: Payer: Self-pay

## 2020-01-06 DIAGNOSIS — Z89419 Acquired absence of unspecified great toe: Secondary | ICD-10-CM

## 2020-01-06 DIAGNOSIS — E1149 Type 2 diabetes mellitus with other diabetic neurological complication: Secondary | ICD-10-CM

## 2020-01-06 DIAGNOSIS — Z872 Personal history of diseases of the skin and subcutaneous tissue: Secondary | ICD-10-CM

## 2020-01-06 DIAGNOSIS — E119 Type 2 diabetes mellitus without complications: Secondary | ICD-10-CM

## 2020-01-06 DIAGNOSIS — L97521 Non-pressure chronic ulcer of other part of left foot limited to breakdown of skin: Secondary | ICD-10-CM

## 2020-01-06 NOTE — Progress Notes (Signed)
Patient was measured for med necessity extra depth shoes (x2) w/ 3 custom f/o and one toe filler.. Patient was measured w/ brannock to determine size and width.  Foam impression mold was achieved and deemed appropriate for fabrication of  cmfo.   See DPM chart notes for further documentation and dx codes for determination of medical necessity.  Appropriate forms will be sent to PCP to verify and sign off on medical necessity.

## 2020-01-16 ENCOUNTER — Ambulatory Visit
Admission: RE | Admit: 2020-01-16 | Discharge: 2020-01-16 | Disposition: A | Discharge status: Home / Self Care | Payer: PRIVATE HEALTH INSURANCE | Source: Ambulatory Visit | Attending: Physician Assistant | Admitting: Physician Assistant

## 2020-01-16 ENCOUNTER — Other Ambulatory Visit: Payer: Self-pay

## 2020-01-16 ENCOUNTER — Other Ambulatory Visit: Payer: Self-pay | Admitting: Physician Assistant

## 2020-01-16 DIAGNOSIS — R10814 Left lower quadrant abdominal tenderness: Secondary | ICD-10-CM

## 2020-01-16 MED ORDER — IOPAMIDOL (ISOVUE-300) INJECTION 61%
100.0000 mL | Freq: Once | INTRAVENOUS | Status: AC | PRN
  Administered 2020-01-16: 100 mL via INTRAVENOUS

## 2020-02-09 ENCOUNTER — Telehealth: Payer: Self-pay | Admitting: Podiatry

## 2020-02-09 NOTE — Telephone Encounter (Signed)
Pt left message Friday 5.21 checking on status of diabetic shoes that were ordered.  I returned call and left message that they arrived Friday when I was out of the office and to call so I can get her scheduled for next available appt next week or the week after as Liliane Channel is out of the office this week.

## 2020-03-02 ENCOUNTER — Ambulatory Visit (INDEPENDENT_AMBULATORY_CARE_PROVIDER_SITE_OTHER): Payer: PRIVATE HEALTH INSURANCE | Admitting: Orthotics

## 2020-03-02 ENCOUNTER — Other Ambulatory Visit: Payer: Self-pay

## 2020-03-02 DIAGNOSIS — Z89419 Acquired absence of unspecified great toe: Secondary | ICD-10-CM

## 2020-03-02 DIAGNOSIS — E1149 Type 2 diabetes mellitus with other diabetic neurological complication: Secondary | ICD-10-CM

## 2020-03-02 NOTE — Progress Notes (Signed)

## 2020-03-29 ENCOUNTER — Encounter: Payer: Self-pay | Admitting: Podiatry

## 2020-03-29 ENCOUNTER — Ambulatory Visit (INDEPENDENT_AMBULATORY_CARE_PROVIDER_SITE_OTHER): Payer: PRIVATE HEALTH INSURANCE | Admitting: Podiatry

## 2020-03-29 ENCOUNTER — Ambulatory Visit: Payer: PRIVATE HEALTH INSURANCE | Admitting: Podiatry

## 2020-03-29 ENCOUNTER — Other Ambulatory Visit: Payer: Self-pay

## 2020-03-29 DIAGNOSIS — B351 Tinea unguium: Secondary | ICD-10-CM

## 2020-03-29 DIAGNOSIS — E1149 Type 2 diabetes mellitus with other diabetic neurological complication: Secondary | ICD-10-CM | POA: Diagnosis not present

## 2020-03-29 DIAGNOSIS — Z89419 Acquired absence of unspecified great toe: Secondary | ICD-10-CM

## 2020-03-29 DIAGNOSIS — M79674 Pain in right toe(s): Secondary | ICD-10-CM | POA: Diagnosis not present

## 2020-04-05 NOTE — Progress Notes (Signed)
Subjective: 49 y.o. returns the office today for painful, elongated, thickened toenails. Otherwise she is been doing well.  She denies any open sores.  No increase in swelling or redness.Denies any systemic complaints such as fevers, chills, nausea, vomiting.   PCP: Lavone Orn, MD  Objective: AAO 3, NAD DP/PT pulses palpable, CRT less than 3 seconds Protective sensation decreased with Simms Weinstein monofilament Nails hypertrophic, dystrophic, elongated, brittle, discolored 9. There is tenderness overlying the nails 2-5 bilaterally.  Amputation site is well-healed on the right side.  No nails present in the left side.  No pain with calf compression, swelling, warmth, erythema.  Assessment: Patient presents with symptomatic onychomycosis; hyperkeratotic lesion was  Plan: -Treatment options including alternatives, risks, complications were discussed -Nails sharply debrided 8 without complication/bleeding. -Discussed daily foot inspection. If there are any changes, to call the office immediately.  -Follow-up in 3 months or sooner if any problems are to arise. In the meantime, encouraged to call the office with any questions, concerns, changes symptoms.  Celesta Gentile, DPM

## 2020-04-21 ENCOUNTER — Telehealth: Payer: Self-pay | Admitting: Podiatry

## 2020-04-21 ENCOUNTER — Ambulatory Visit (INDEPENDENT_AMBULATORY_CARE_PROVIDER_SITE_OTHER): Payer: PRIVATE HEALTH INSURANCE

## 2020-04-21 DIAGNOSIS — M779 Enthesopathy, unspecified: Secondary | ICD-10-CM | POA: Diagnosis not present

## 2020-04-21 DIAGNOSIS — M79672 Pain in left foot: Secondary | ICD-10-CM

## 2020-04-21 NOTE — Telephone Encounter (Signed)
Pt called and needed to get in sooner got appt for pt appt tomorrow afternoon but she wants sooner she asked me to send a message to see if that could be changed

## 2020-04-21 NOTE — Addendum Note (Signed)
Addended by: Teresita Madura on: 04/21/2020 04:51 PM   Modules accepted: Orders

## 2020-04-21 NOTE — Telephone Encounter (Signed)
I called the patient. She came in for x-rays and I reviewed them. I was at a meeting and she didn't want to wait. I called her to go over the results and recommended to go into CAM boot. Has appt for tomorrow.

## 2020-04-22 ENCOUNTER — Other Ambulatory Visit: Payer: Self-pay

## 2020-04-22 ENCOUNTER — Other Ambulatory Visit: Payer: Self-pay | Admitting: Podiatry

## 2020-04-22 ENCOUNTER — Ambulatory Visit (INDEPENDENT_AMBULATORY_CARE_PROVIDER_SITE_OTHER): Payer: PRIVATE HEALTH INSURANCE

## 2020-04-22 ENCOUNTER — Ambulatory Visit (INDEPENDENT_AMBULATORY_CARE_PROVIDER_SITE_OTHER): Payer: PRIVATE HEALTH INSURANCE | Admitting: Podiatry

## 2020-04-22 DIAGNOSIS — M79672 Pain in left foot: Secondary | ICD-10-CM

## 2020-04-22 DIAGNOSIS — M779 Enthesopathy, unspecified: Secondary | ICD-10-CM | POA: Diagnosis not present

## 2020-04-22 DIAGNOSIS — S93401A Sprain of unspecified ligament of right ankle, initial encounter: Secondary | ICD-10-CM

## 2020-04-22 DIAGNOSIS — S99912A Unspecified injury of left ankle, initial encounter: Secondary | ICD-10-CM | POA: Diagnosis not present

## 2020-04-22 DIAGNOSIS — Z89412 Acquired absence of left great toe: Secondary | ICD-10-CM

## 2020-04-25 NOTE — Progress Notes (Signed)
Subjective: 49 year old female presents the office today for concerns of ankle injury x2 yesterday.  She states that she twisted her ankle twice while in the shower and one with walking.  She did come by the office as a walk-in however I was not able to see her.  An x-ray was performed and I did call her with results.  She presents today for evaluation. Denies any systemic complaints such as fevers, chills, nausea, vomiting. No acute changes since last appointment, and no other complaints at this time.   Objective: AAO x3, NAD DP/PT pulses palpable bilaterally, CRT less than 3 seconds There is edema present to lateral aspect of the left ankle and foot.  There is tenderness on the fifth metatarsal base and to the lateral ankle complex.  No proximal tib-fib pain.  No pain of the talus or other areas of the foot. No pain with calf compression, swelling, warmth, erythema  Assessment: Left ankle sprain  Plan: -All treatment options discussed with the patient including all alternatives, risks, complications.  -X-rays obtained reviewed of the ankle today.  There is some edema noted but no obvious signs of acute fracture.  However I do want her to remain in the cam boot, elevate.  Tylenol as needed. -Patient encouraged to call the office with any questions, concerns, change in symptoms.   Return in about 2 weeks (around 05/06/2020).  Repeat x-rays if still having discomfort of the foot and ankle  Trula Slade DPM

## 2020-05-06 ENCOUNTER — Ambulatory Visit: Payer: PRIVATE HEALTH INSURANCE | Admitting: Podiatry

## 2020-07-05 ENCOUNTER — Other Ambulatory Visit: Payer: Self-pay

## 2020-07-05 ENCOUNTER — Ambulatory Visit (INDEPENDENT_AMBULATORY_CARE_PROVIDER_SITE_OTHER): Payer: PRIVATE HEALTH INSURANCE | Admitting: Podiatry

## 2020-07-05 DIAGNOSIS — E1149 Type 2 diabetes mellitus with other diabetic neurological complication: Secondary | ICD-10-CM | POA: Diagnosis not present

## 2020-07-05 DIAGNOSIS — L989 Disorder of the skin and subcutaneous tissue, unspecified: Secondary | ICD-10-CM

## 2020-07-05 DIAGNOSIS — B351 Tinea unguium: Secondary | ICD-10-CM

## 2020-07-05 DIAGNOSIS — Z89419 Acquired absence of unspecified great toe: Secondary | ICD-10-CM

## 2020-07-05 DIAGNOSIS — M79674 Pain in right toe(s): Secondary | ICD-10-CM

## 2020-07-05 NOTE — Progress Notes (Signed)
Subjective: 49 y.o. returns the office today for diabetic foot exam and for painful, elongated, thickened toenails. Otherwise she is been doing well.  She denies any open sores.  Ankle pain has resolved. Denies any systemic complaints such as fevers, chills, nausea, vomiting.   PCP: Lavone Orn, MD  Objective: AAO 3, NAD DP/PT pulses palpable, CRT less than 3 seconds Protective sensation decreased with Simms Weinstein monofilament Nails hypertrophic, dystrophic, elongated, brittle, discolored 9. There is tenderness overlying the nails 2-5 bilaterally.  Amputation site is well-healed on the right side.  No nails present in the left hallux  Hyperkeratotic lesion left plantar heel. No underlying ulceration, drainage, signs of infection.  No pain to the ankles and no swelling, redness.  No pain with calf compression, swelling, warmth, erythema.  Assessment: Patient presents with symptomatic onychomycosis; hyperkeratotic lesion   Plan: -Treatment options including alternatives, risks, complications were discussed -Nails sharply debrided 8 without complication/bleeding. -Hyperkeratotic lesion sharply debrided x 1 without complications or bleeding. -Discussed daily foot inspection. If there are any changes, to call the office immediately.  -Follow-up in 3 months or sooner if any problems are to arise. In the meantime, encouraged to call the office with any questions, concerns, changes symptoms.  Celesta Gentile, DPM

## 2020-07-07 ENCOUNTER — Other Ambulatory Visit (HOSPITAL_COMMUNITY): Payer: Self-pay | Admitting: Internal Medicine

## 2020-07-07 ENCOUNTER — Ambulatory Visit: Payer: PRIVATE HEALTH INSURANCE | Attending: Internal Medicine

## 2020-07-07 DIAGNOSIS — Z23 Encounter for immunization: Secondary | ICD-10-CM

## 2020-07-07 NOTE — Progress Notes (Signed)
   Covid-19 Vaccination [facility]  Name:  DEMETRIA IWAI    MRN: [id number] DOB: December 20, 1970  07/07/2020  [name] was observed post Covid-19 immunization for 15 minutes without incident. She was provided with Vaccine Information Sheet and instruction to access the V-Safe system.   [name] was instructed to call 911 with any severe reactions post vaccine: Marland Kitchen Difficulty breathing  . Swelling of face and throat  . A fast heartbeat  . A bad rash all over body  . Dizziness and weakness

## 2020-10-05 ENCOUNTER — Ambulatory Visit: Payer: PRIVATE HEALTH INSURANCE | Admitting: Podiatry

## 2020-11-01 ENCOUNTER — Encounter: Payer: Self-pay | Admitting: Podiatry

## 2020-11-02 ENCOUNTER — Encounter: Payer: Self-pay | Admitting: Podiatry

## 2020-11-02 ENCOUNTER — Other Ambulatory Visit: Payer: Self-pay

## 2020-11-02 ENCOUNTER — Ambulatory Visit (INDEPENDENT_AMBULATORY_CARE_PROVIDER_SITE_OTHER): Payer: PRIVATE HEALTH INSURANCE | Admitting: Podiatry

## 2020-11-02 ENCOUNTER — Ambulatory Visit (INDEPENDENT_AMBULATORY_CARE_PROVIDER_SITE_OTHER): Payer: PRIVATE HEALTH INSURANCE

## 2020-11-02 DIAGNOSIS — L97511 Non-pressure chronic ulcer of other part of right foot limited to breakdown of skin: Secondary | ICD-10-CM | POA: Diagnosis not present

## 2020-11-02 DIAGNOSIS — M79671 Pain in right foot: Secondary | ICD-10-CM

## 2020-11-02 DIAGNOSIS — L03039 Cellulitis of unspecified toe: Secondary | ICD-10-CM | POA: Diagnosis not present

## 2020-11-02 DIAGNOSIS — E1149 Type 2 diabetes mellitus with other diabetic neurological complication: Secondary | ICD-10-CM

## 2020-11-02 DIAGNOSIS — E08621 Diabetes mellitus due to underlying condition with foot ulcer: Secondary | ICD-10-CM

## 2020-11-02 DIAGNOSIS — L97512 Non-pressure chronic ulcer of other part of right foot with fat layer exposed: Secondary | ICD-10-CM

## 2020-11-02 MED ORDER — AMOXICILLIN-POT CLAVULANATE 875-125 MG PO TABS: 1.0000 | ORAL_TABLET | Freq: Two times a day (BID) | ORAL | 0 refills | Status: DC

## 2020-11-06 NOTE — Progress Notes (Signed)
Subjective: 50 year old female presents the office today for an acute appointment for right foot ulceration which turned green 1 week ago.  She said no recent treatment.  She thinks it started as the toenail.  She still with significant skin that she trimmed her nail.  She states that her blood sugar has been "horrible" but her medications have been adjusted and hopefully it is improving. She does not know her A1c. Denies any systemic complaints such as fevers, chills, nausea, vomiting. No acute changes since last appointment, and no other complaints at this time.   Objective: AAO x3, NAD DP/PT pulses palpable bilaterally, CRT less than 3 seconds Sensation is decreased with Semmes Weinstein monofilament.  On the right third toe there is superficial wound present there is edema and erythema noted to the toe.  No drainage or pus identified there is no fluctuation crepitation.  No ascending cellulitis. No pain with calf compression, swelling, warmth, erythema  Assessment: Right third toe ulceration with cellulitis  Plan: -All treatment options discussed with the patient including all alternatives, risks, complications.  -X-rays obtained reviewed.  No evidence of acute fracture, osteomyelitis or soft tissue emphysema. -Patient mupirocin ointment to apply to the wound daily.  Continue offloading at all times.  Recommend return to surgical shoe. -Augmentin -Monitor for any clinical signs or symptoms of infection and directed to call the office immediately should any occur or go to the ER. -Patient encouraged to call the office with any questions, concerns, change in symptoms.   Trula Slade DPM

## 2020-11-09 ENCOUNTER — Ambulatory Visit (INDEPENDENT_AMBULATORY_CARE_PROVIDER_SITE_OTHER): Payer: PRIVATE HEALTH INSURANCE | Admitting: Podiatry

## 2020-11-09 ENCOUNTER — Other Ambulatory Visit: Payer: Self-pay

## 2020-11-09 DIAGNOSIS — E08621 Diabetes mellitus due to underlying condition with foot ulcer: Secondary | ICD-10-CM | POA: Diagnosis not present

## 2020-11-09 DIAGNOSIS — L97511 Non-pressure chronic ulcer of other part of right foot limited to breakdown of skin: Secondary | ICD-10-CM | POA: Diagnosis not present

## 2020-11-09 DIAGNOSIS — E1149 Type 2 diabetes mellitus with other diabetic neurological complication: Secondary | ICD-10-CM | POA: Diagnosis not present

## 2020-11-13 NOTE — Progress Notes (Signed)
Subjective: 50 year old female presents the office today for follow-up evaluation of right third toe cellulitis, ulceration.  Overall she states that is doing better.  She is on antibiotics as it took her couple days to get them started.  Denies any drainage or pus and overall swelling is improved as well as the redness.  She denies any fevers, chills, nausea, vomiting.  No calf pain, chest pain, shortness of breath.  She has no other concerns today.  Objective: AAO x3, NAD DP/PT pulses palpable bilaterally, CRT less than 3 seconds Sensation is decreased with Semmes Weinstein monofilament.  On the right third toe superficial wound present to the lateral aspect which is almost healed.  There is decreased edema erythema.  There is no ascending cellulitis.  There is no probing, undermining or tunneling.  No fluctuation or crepitation but there is no malodor. No pain with calf compression, swelling, warmth, erythema  Assessment: Right third toe ulceration with cellulitis-improving  Plan: -All treatment options discussed with the patient including all alternatives, risks, complications.  -Overall the wound is improving and getting smaller and almost healed.  Recommended to continue with a small amount of antibiotic ointment to the wound daily.  Finish course of antibiotics.  Offloading.  She presents today wearing a regular shoe but encourage surgical shoe and offloading to help prevent any irritation in order to get the wound to heal.  Elevation. -Monitor for any clinical signs or symptoms of infection and directed to call the office immediately should any occur or go to the ER.  Return in about 2 weeks (around 11/23/2020).  Trula Slade DPM

## 2020-11-23 ENCOUNTER — Encounter: Payer: Self-pay | Admitting: Podiatry

## 2020-11-23 ENCOUNTER — Ambulatory Visit (INDEPENDENT_AMBULATORY_CARE_PROVIDER_SITE_OTHER): Payer: PRIVATE HEALTH INSURANCE | Admitting: Podiatry

## 2020-11-23 ENCOUNTER — Other Ambulatory Visit: Payer: Self-pay

## 2020-11-23 DIAGNOSIS — E08621 Diabetes mellitus due to underlying condition with foot ulcer: Secondary | ICD-10-CM | POA: Diagnosis not present

## 2020-11-23 DIAGNOSIS — L97511 Non-pressure chronic ulcer of other part of right foot limited to breakdown of skin: Secondary | ICD-10-CM | POA: Diagnosis not present

## 2020-11-23 DIAGNOSIS — E1149 Type 2 diabetes mellitus with other diabetic neurological complication: Secondary | ICD-10-CM | POA: Diagnosis not present

## 2020-11-23 MED ORDER — AMOXICILLIN-POT CLAVULANATE 875-125 MG PO TABS: 1.0000 | ORAL_TABLET | Freq: Two times a day (BID) | ORAL | 0 refills | Status: DC

## 2020-11-23 NOTE — Progress Notes (Signed)
Subjective: 50 year old female presents the office today for follow-up evaluation of right third toe cellulitis, ulceration.  She states the area was doing better however she was on her feet a lot yesterday at work and she thinks the toe is more irritated today.  Is somewhat more swollen or red but denies any drainage or pus.  Prior to yesterday she felt that the wound is healing better. She denies any fevers, chills, nausea, vomiting.  No calf pain, chest pain, shortness of breath.  She has no other concerns today.  Objective: AAO x3, NAD DP/PT pulses palpable bilaterally, CRT less than 3 seconds Sensation is decreased with Semmes Weinstein monofilament.  On the right third toe superficial wound present to the lateral aspect of the patient took a superficial abrasion.  There is no probing to bone or tunneling.  There is localized edema erythema to the area but there is no fluctuation or crepitation but there is no malodor.  There is no ascending cellulitis.  No pain with calf compression, swelling, warmth, erythema  Assessment: Right third toe ulceration with cellulitis  Plan: -All treatment options discussed with the patient including all alternatives, risks, complications.  -The wound itself is doing well but there is increased edema and erythema although localized.  Refilled Augmentin.  Encouraged elevation and offloading.  The note was provided for work to limit the amount of time she is on her feet and so only work with one provider.  Trula Slade DPM

## 2020-12-07 ENCOUNTER — Ambulatory Visit (INDEPENDENT_AMBULATORY_CARE_PROVIDER_SITE_OTHER): Payer: PRIVATE HEALTH INSURANCE | Admitting: Podiatry

## 2020-12-07 ENCOUNTER — Other Ambulatory Visit: Payer: Self-pay

## 2020-12-07 DIAGNOSIS — E08621 Diabetes mellitus due to underlying condition with foot ulcer: Secondary | ICD-10-CM

## 2020-12-07 DIAGNOSIS — L97511 Non-pressure chronic ulcer of other part of right foot limited to breakdown of skin: Secondary | ICD-10-CM

## 2020-12-07 DIAGNOSIS — E1149 Type 2 diabetes mellitus with other diabetic neurological complication: Secondary | ICD-10-CM

## 2020-12-07 NOTE — Progress Notes (Signed)
Subjective: 50 year old female presents the office today for follow-up evaluation of right third toe cellulitis, ulceration.  Overall states that she is doing well.  She has had to stop antibiotics due to GI upset but she has not seen any swelling or redness or any drainage and she feels the wound is healed.  She has no pain. She denies any fevers, chills, nausea, vomiting.  No calf pain, chest pain, shortness of breath.  She has no other concerns today.  Objective: AAO x3, NAD DP/PT pulses palpable bilaterally, CRT less than 3 seconds Sensation is decreased with Semmes Weinstein monofilament.  On the right third toe, lateral aspect, there is a small scab present on the area the previous ulceration and there is no underlying ulceration drainage or any signs of infection there is no significant edema, erythema.  No fluctuance or crepitation.  There is no malodor. No pain with calf compression, swelling, warmth, erythema  Assessment: Right third toe ulceration with cellulitis-resolved  Plan: -All treatment options discussed with the patient including all alternatives, risks, complications.  -Patient wound is healed.  I would continue a small amount of antibiotic ointment and a bandage until the scabs come off.  Is a small pinpoint scab.  Monitor for any reoccurrence of infection or ulceration.  Discussed that if inspection.  See back in 3 months or sooner if issues are to arise.  [name] DPM

## 2020-12-10 ENCOUNTER — Other Ambulatory Visit: Payer: Self-pay | Admitting: Internal Medicine

## 2020-12-10 DIAGNOSIS — Z1231 Encounter for screening mammogram for malignant neoplasm of breast: Secondary | ICD-10-CM

## 2021-02-03 ENCOUNTER — Ambulatory Visit
Admission: RE | Admit: 2021-02-03 | Discharge: 2021-02-03 | Disposition: A | Discharge status: Home / Self Care | Payer: PRIVATE HEALTH INSURANCE | Source: Ambulatory Visit | Attending: Internal Medicine | Admitting: Internal Medicine

## 2021-02-03 ENCOUNTER — Other Ambulatory Visit: Payer: Self-pay

## 2021-02-03 DIAGNOSIS — Z1231 Encounter for screening mammogram for malignant neoplasm of breast: Secondary | ICD-10-CM

## 2021-02-10 ENCOUNTER — Ambulatory Visit (INDEPENDENT_AMBULATORY_CARE_PROVIDER_SITE_OTHER): Payer: PRIVATE HEALTH INSURANCE

## 2021-02-10 ENCOUNTER — Encounter: Payer: Self-pay | Admitting: Podiatry

## 2021-02-10 ENCOUNTER — Telehealth: Payer: Self-pay | Admitting: Podiatry

## 2021-02-10 ENCOUNTER — Ambulatory Visit (INDEPENDENT_AMBULATORY_CARE_PROVIDER_SITE_OTHER): Payer: PRIVATE HEALTH INSURANCE | Admitting: Podiatry

## 2021-02-10 ENCOUNTER — Other Ambulatory Visit: Payer: Self-pay

## 2021-02-10 DIAGNOSIS — M7989 Other specified soft tissue disorders: Secondary | ICD-10-CM

## 2021-02-10 DIAGNOSIS — M14672 Charcot's joint, left ankle and foot: Secondary | ICD-10-CM

## 2021-02-10 DIAGNOSIS — E08621 Diabetes mellitus due to underlying condition with foot ulcer: Secondary | ICD-10-CM

## 2021-02-10 DIAGNOSIS — L97511 Non-pressure chronic ulcer of other part of right foot limited to breakdown of skin: Secondary | ICD-10-CM

## 2021-02-10 DIAGNOSIS — M79672 Pain in left foot: Secondary | ICD-10-CM | POA: Diagnosis not present

## 2021-02-10 NOTE — Telephone Encounter (Signed)
Patient woke up 5/23 with severe swelling of the left foot. She is having difficulty walking over several days and swelling has not gone down. Patient states no known injury to foot/ankle. Patient is requesting a call back from [facility] or nurse. If patient needs to be scheduled please let me know

## 2021-02-11 ENCOUNTER — Ambulatory Visit (HOSPITAL_COMMUNITY)
Admission: RE | Admit: 2021-02-11 | Discharge: 2021-02-11 | Disposition: A | Discharge status: Home / Self Care | Payer: PRIVATE HEALTH INSURANCE | Source: Ambulatory Visit | Attending: Podiatry | Admitting: Podiatry

## 2021-02-11 ENCOUNTER — Telehealth: Payer: Self-pay | Admitting: *Deleted

## 2021-02-11 DIAGNOSIS — M7989 Other specified soft tissue disorders: Secondary | ICD-10-CM | POA: Insufficient documentation

## 2021-02-11 NOTE — Telephone Encounter (Signed)
[name] w/ [facility] is wanted to let Dr Jacqualyn Posey know that patient's ultrasound Preliminary report is ready to view in patient"s chart.

## 2021-02-13 NOTE — Progress Notes (Signed)
Subjective: 50 year old female presents the office today for an acute appointment getting increased swelling, pain to her left foot.  She denies recent injury or trauma and she notes over the last couple days has been getting more swollen and painful. She states this started up the leg at times.  She said no recent treatment.  Denies any ulcerations.  Her blood sugars have been high running in the 200s.  Denies any systemic complaints such as fevers, chills, nausea, vomiting. No acute changes since last appointment, and no other complaints at this time.   Objective: AAO x3, NAD DP/PT pulses palpable bilaterally, CRT less than 3 seconds There is edema to the left foot as well as localized erythema and warmth of the foot.  Tenderness palpation of dorsal lateral aspect of the foot.  Flexor, extensor tendons appear to be intact.  No open lesions.  No areas of fluctuance or crepitation. No pain with calf compression, swelling, warmth, erythema  Assessment: Charcot left foot  Plan: -All treatment options discussed with the patient including all alternatives, risks, complications.  -X-rays obtained and present reviewed.  Navicular fracture noted and concern for Charcot given her clinical appearance.  I did not put her into a total contact cast today given the swelling in the cam boot was dispensed and she has a knee scooter at home to stay off the foot.  Elevation.  I would like to do antibiotics but she states that she has a prescription of Augmentin at home that she never started and she could not take this.  I ordered a venous duplex to rule out DVT although unlikely.  New cam boot dispensed today. -Patient encouraged to call the office with any questions, concerns, change in symptoms.   Return for Charcot. in 1 week-x-ray foot, ankle left side  Trula Slade DPM

## 2021-02-17 ENCOUNTER — Encounter: Payer: Self-pay | Admitting: Podiatry

## 2021-02-17 ENCOUNTER — Other Ambulatory Visit: Payer: Self-pay

## 2021-02-17 ENCOUNTER — Ambulatory Visit (INDEPENDENT_AMBULATORY_CARE_PROVIDER_SITE_OTHER): Payer: PRIVATE HEALTH INSURANCE

## 2021-02-17 ENCOUNTER — Ambulatory Visit (INDEPENDENT_AMBULATORY_CARE_PROVIDER_SITE_OTHER): Payer: PRIVATE HEALTH INSURANCE | Admitting: Podiatry

## 2021-02-17 DIAGNOSIS — M14672 Charcot's joint, left ankle and foot: Secondary | ICD-10-CM | POA: Diagnosis not present

## 2021-02-17 DIAGNOSIS — S99912D Unspecified injury of left ankle, subsequent encounter: Secondary | ICD-10-CM | POA: Diagnosis not present

## 2021-02-17 DIAGNOSIS — M14671 Charcot's joint, right ankle and foot: Secondary | ICD-10-CM | POA: Diagnosis not present

## 2021-02-18 ENCOUNTER — Telehealth: Payer: Self-pay | Admitting: Podiatry

## 2021-02-18 ENCOUNTER — Encounter: Payer: Self-pay | Admitting: Podiatry

## 2021-02-18 NOTE — Telephone Encounter (Signed)
[name]- I decided to order a MRI of the left foot and ankle. Can you please follow up? Thanks

## 2021-02-18 NOTE — Telephone Encounter (Signed)
Patient calling to check the status of referral to [facility]. Patient called [facility] to get scheduled for MRI, but no order is in epic to be scheduled. Please advise; send order to [facility] as soon as possible. Patient states she would like to have MRI done asap.

## 2021-02-18 NOTE — Telephone Encounter (Signed)
Called and spoke with the patient and the MRI is scheduled for 03-09-2021 at 3 pm. Lattie Haw

## 2021-02-23 NOTE — Progress Notes (Signed)
Subjective: 50 year old female presents the office today for follow-up evaluation of likely Charcot of her left foot.  Since last saw her the swelling has improved and she has been dressing off the foot is much as possible and she has been seen at work.  Today she does report a injury that she had prior to when she came in as she states that she did trip twisting her foot.  Swelling the redness is improved.  She did take antibiotics as directed.  Objective: AAO x3, NAD DP/PT pulses palpable bilaterally, CRT less than 3 seconds The edema, erythema to the foot mostly lateral aspect has improved.  There is decreased tenderness palpation to the foot.  There is no specific area of pinpoint tenderness.  Flexor, extensor tendons appear to be intact.  Overall the foot has not changed position compared to when I last saw her.  No open lesions. No pain with calf compression, swelling, warmth, erythema  Assessment: Charcot left foot versus injury  Plan: -All treatment options discussed with the patient including all alternatives, risks, complications.  -Repeat x-rays obtained and reviewed.  Navicular fracture present but appears to be unchanged compared to prior x-rays.  This could be from the injury but still concern for Charcot.  I will order an MRI of the foot and ankle to further evaluate. -For now continue with immobilization, elevation limit activity. -Compression  Trula Slade DPM

## 2021-02-27 ENCOUNTER — Ambulatory Visit
Admission: RE | Admit: 2021-02-27 | Discharge: 2021-02-27 | Disposition: A | Discharge status: Home / Self Care | Payer: PRIVATE HEALTH INSURANCE | Source: Ambulatory Visit | Attending: Podiatry | Admitting: Podiatry

## 2021-02-27 ENCOUNTER — Other Ambulatory Visit: Payer: Self-pay

## 2021-02-27 DIAGNOSIS — M14672 Charcot's joint, left ankle and foot: Secondary | ICD-10-CM

## 2021-02-27 DIAGNOSIS — S99912D Unspecified injury of left ankle, subsequent encounter: Secondary | ICD-10-CM

## 2021-03-09 ENCOUNTER — Other Ambulatory Visit: Payer: PRIVATE HEALTH INSURANCE

## 2021-03-10 ENCOUNTER — Ambulatory Visit (INDEPENDENT_AMBULATORY_CARE_PROVIDER_SITE_OTHER): Payer: No Typology Code available for payment source | Admitting: Podiatry

## 2021-03-10 ENCOUNTER — Encounter: Payer: Self-pay | Admitting: Podiatry

## 2021-03-10 ENCOUNTER — Other Ambulatory Visit: Payer: Self-pay

## 2021-03-10 DIAGNOSIS — M7989 Other specified soft tissue disorders: Secondary | ICD-10-CM | POA: Diagnosis not present

## 2021-03-10 DIAGNOSIS — M14672 Charcot's joint, left ankle and foot: Secondary | ICD-10-CM

## 2021-03-10 NOTE — Patient Instructions (Signed)
[facility] An [employer] is a type of bandage (dressing) for the foot and leg. The dressing is a gauze wrap that is soaked with a type of medicine called zinc oxide. The gauze may also include other lotions and medicines that help in wound healing, such as calamine. An Unna boot may be used to treat: Open sores (ulcers) on the foot, heel, or leg. Swelling from disorders that affect the veins or lymphatic system (lymphedema). Skin conditions such as chronic inflammation caused by poor blood flow (stasis dermatitis). The dressing is applied by a health care provider. The gauze is wrapped around your lower extremity in several layers, usually starting at the toes and going upward to the knee. A dry outer wrap goes over the medicated wrap for supportand compression.  Before applying the [employer], your health care provider will clean your leg and foot and may apply an antibiotic ointment. You may be asked to raise (elevate) your leg for a while to reduce swelling before the boot is applied. The boot will dry and harden after it is applied. The boot may need to be changed orreplaced about twice a week. Follow these instructions at home: [place] as told by your health care provider. You may need to wear a slipper or shoe over the boot that is one or two sizes larger than normal. Check the skin around the boot every day. Tell your health care provider about any concerns. Do not stick anything inside the boot to scratch your skin. Doing that increases your risk of infection. Keep your [employer] clean and dry. Check every day for signs of infection. Check for: Redness, swelling, or pain in your foot or toes. Fluid or blood coming from the boot. Pus or a bad smell coming from the boot. Remove the boot and call your health care provider if you have signs of poor blood flow, such as: Your toes tingle or become numb. Your toes turn cold or turn blue or pale. Your toes are more swollen  or painful. You are unable to move your toes. Activity You may walk with the boot once it has dried. Ask your health care provider how much walking is safe for you. Avoid sitting for a long time without moving. Get up to take short walks as told by your health care provider. This is important to improve blood flow. Bathing Do not take baths, swim, or use a hot tub until your health care provider approves. Ask your health care provider if you may take showers. If your health care provider approves a bath or a shower, do not let the Unna boot get wet. If you take a shower, cover the boot with a watertight covering. If you take a bath, keep your leg with the boot out of the tub. General instructions Keep your leg elevated above the level of your heart while you are sitting or lying down. This will decrease swelling. Do not sit with your knee bent for long periods of time. Take over-the-counter and prescription medicines only as told by your health care provider. Do not use any products that contain nicotine or tobacco, such as cigarettes, e-cigarettes, and chewing tobacco. These can delay healing. If you need help quitting, ask your health care provider. Keep all follow-up visits as told by your health care provider. This is important. Contact a health care provider if: Your skin feels itchy inside the boot. You have a burning sensation, a rash, or  itchy, red, swollen areas of skin (hives) in the boot area. You have a fever or chills. You have any signs of infection, such as: New redness, swelling, or pain. More fluid or blood coming from the boot. Pus or a bad smell coming from the boot. You have increased numbness or pain in your foot or toes. You have any changes in skin color on your foot or toes, such as the skin turning blue or pale or developing patchy areas with spots. Your boot has been damaged or feels like it is no longer fitting properly. Summary An [name] boot is a type of bandage  (dressing) system for the foot and leg. The dressing is a gauze wrap that is soaked with a type of medicine (zinc oxide) to treat foot, heel, or leg ulcers, swelling from disorders that affect the veins or lymphatic system (lymphedema), and skin conditions caused by poor blood flow (stasis dermatitis). This dressing is applied by a health care provider. After it is applied, the boot will dry and harden. The boot may need to be changed or replaced about twice a week. Let your health care provider know if you have any signs of poor blood flow or infection. This information is not intended to replace advice given to you by your health care provider. Make sure you discuss any questions you have with your healthcare provider. Document Revised: 12/24/2018 Document Reviewed: 05/15/2018 Elsevier Patient Education  [place].

## 2021-03-10 NOTE — Progress Notes (Addendum)
Subjective: 50 year old female presents the office today for follow-up evaluation of left foot injury, Charcot.  She states that she has been on her feet a lot recently as her dad was in the hospital but other than that she has been try to keep the foot elevated and limit her weightbearing.  She feels the swelling has improved some as well as the redness. Denies any systemic complaints such as fevers, chills, nausea, vomiting. No acute changes since last appointment, and no other complaints at this time.   Objective: AAO x3, NAD DP/PT pulses palpable bilaterally, CRT less than 3 seconds No significant pain of the left lower extremity.  There is still mild to moderate edema on the left foot there is no erythema or warmth associated with this.  On anterior leg there is a light skin rash noted there is no skin breakdown or warmth.  Flexor, extensor tendons are to be intact.  The foot has not changed position and no collapse. No pain with calf compression, swelling, warmth, erythema  Assessment: Left foot Charcot  Plan: -All treatment options discussed with the patient including all alternatives, risks, complications.  -Reviewed the MRI with her.  Discussed total contact cast however she still having some swelling and it appears some irritation of the skin and due to this I did not apply the total contact cast today.  We will keep her in the cam boot but I will discussed with her in regards to management she states that her foot elevated and only very limited walking.  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 -Unna boot was applied and precautions were advised on when to remove this as well. -Patient encouraged to call the office with any questions, concerns, change in symptoms.   *x-ray foot next appointment   Trula Slade DPM

## 2021-03-15 ENCOUNTER — Ambulatory Visit: Payer: PRIVATE HEALTH INSURANCE | Admitting: Podiatry

## 2021-03-22 ENCOUNTER — Ambulatory Visit (INDEPENDENT_AMBULATORY_CARE_PROVIDER_SITE_OTHER): Payer: No Typology Code available for payment source | Admitting: Podiatry

## 2021-03-22 ENCOUNTER — Encounter: Payer: Self-pay | Admitting: Podiatry

## 2021-03-22 ENCOUNTER — Other Ambulatory Visit: Payer: Self-pay

## 2021-03-22 ENCOUNTER — Ambulatory Visit (INDEPENDENT_AMBULATORY_CARE_PROVIDER_SITE_OTHER): Payer: No Typology Code available for payment source

## 2021-03-22 DIAGNOSIS — M14672 Charcot's joint, left ankle and foot: Secondary | ICD-10-CM

## 2021-03-22 DIAGNOSIS — M7989 Other specified soft tissue disorders: Secondary | ICD-10-CM

## 2021-03-22 NOTE — Progress Notes (Signed)
Subjective: 50 year old female presents the office today for follow-up evaluation of left foot injury, Charcot.  States that she is doing better not having any pain.  The swelling is also improved only in the boot was helpful.  She does continue to walk in the cam boot but she is limiting her activity and she is sitting at work. Denies any systemic complaints such as fevers, chills, nausea, vomiting. No acute changes since last appointment, and no other complaints at this time.   Objective: AAO x3, NAD DP/PT pulses palpable bilaterally, CRT less than 3 seconds No significant pain of the left lower extremity.  Still some mild edema but overall improved.  No significant warmth there is no erythema.  The foot has not changed position is no significant collapse of the arch noted today.  No open lesions. No pain with calf compression, swelling, warmth, erythema  Assessment: Left foot Charcot  Plan: -All treatment options discussed with the patient including all alternatives, risks, complications.  -Repeat x-rays obtained and reviewed.  Charcot changes present of the midfoot particular on the navicular area. -Would recommend total contact cast which she has to go to town this week and she is driving to [place] and has not been in total contact cast on her.  We will continue the cam boot.  Prescription for was given for compression socks.  Encouraged elevation.  Trula Slade DPM

## 2021-03-29 ENCOUNTER — Ambulatory Visit (INDEPENDENT_AMBULATORY_CARE_PROVIDER_SITE_OTHER): Payer: No Typology Code available for payment source

## 2021-03-29 ENCOUNTER — Other Ambulatory Visit: Payer: Self-pay

## 2021-03-29 ENCOUNTER — Encounter: Payer: Self-pay | Admitting: Podiatry

## 2021-03-29 ENCOUNTER — Ambulatory Visit (INDEPENDENT_AMBULATORY_CARE_PROVIDER_SITE_OTHER): Payer: No Typology Code available for payment source | Admitting: Podiatry

## 2021-03-29 DIAGNOSIS — M14672 Charcot's joint, left ankle and foot: Secondary | ICD-10-CM

## 2021-03-29 DIAGNOSIS — E1149 Type 2 diabetes mellitus with other diabetic neurological complication: Secondary | ICD-10-CM | POA: Diagnosis not present

## 2021-03-29 NOTE — Progress Notes (Signed)
Subjective: 50 year old female presents the office today for follow-up evaluation of left foot injury, Charcot.  She recently just got back from medication but she wore the boot.  No recent injury or falls.  She does note some peeling skin as the swelling has improved.  No redness or warmth.  She has no other concerns today.   Objective: AAO x3, NAD DP/PT pulses palpable bilaterally, CRT less than 3 seconds No significant pain of the left lower extremity.  Still some mild edema but overall is continue to improve.  No significant warmth there is no erythema.  The foot has not changed position is no significant collapse of the arch noted today.  No open lesions. No pain with calf compression, swelling, warmth, erythema  Assessment: Left foot Charcot  Plan: -All treatment options discussed with the patient including all alternatives, risks, complications.  -Repeat x-rays obtained and reviewed.  Charcot changes present of the midfoot particular on the navicular area.  Stable compared to last x-ray. -At this point recommend total contact cast.  This was applied and sure to pad all bony prominences.  My plan on seeing her on Thursday to change the cast to check to see if there is any skin irritation.  Discussed she can walk in the boot although limit the amount of walking.  Encouraged elevation.  There is any rubbing or irritation of the skin that she can feel to let me know immediately.  Trula Slade DPM PM

## 2021-03-31 ENCOUNTER — Ambulatory Visit (INDEPENDENT_AMBULATORY_CARE_PROVIDER_SITE_OTHER): Payer: No Typology Code available for payment source | Admitting: Podiatry

## 2021-03-31 ENCOUNTER — Other Ambulatory Visit: Payer: Self-pay

## 2021-03-31 ENCOUNTER — Encounter: Payer: Self-pay | Admitting: Podiatry

## 2021-03-31 DIAGNOSIS — L600 Ingrowing nail: Secondary | ICD-10-CM | POA: Diagnosis not present

## 2021-03-31 DIAGNOSIS — M14672 Charcot's joint, left ankle and foot: Secondary | ICD-10-CM | POA: Diagnosis not present

## 2021-03-31 DIAGNOSIS — L84 Corns and callosities: Secondary | ICD-10-CM | POA: Diagnosis not present

## 2021-03-31 DIAGNOSIS — E1149 Type 2 diabetes mellitus with other diabetic neurological complication: Secondary | ICD-10-CM | POA: Diagnosis not present

## 2021-03-31 NOTE — Progress Notes (Signed)
Subjective: 50 year old female presents the office today for follow-up evaluation of left foot injury, Charcot.  She states that the cast is "miserable".  She does note that night after the cast was applied the swelling went down her foot was sliding.  She did not go to work yesterday because of the discomfort.  Objective: AAO x3, NAD DP/PT pulses palpable bilaterally, CRT less than 3 seconds No significant pain of the left lower extremity.  There is decreased edema.  Hyperkeratotic tissue plantar aspect the heel without any underlying ulceration drainage or any signs of infection.  There is slight erythema to the medial fourth digit on the left side with mild incurvation of the nail.  I was able to stop it with the nail and there is no drainage or pus.  No ascending cellulitis. No pain with calf compression, swelling, warmth, erythema  Assessment: Left foot Charcot; new callus left heel, ingrown toenail fourth toenail  Plan: -All treatment options discussed with the patient including all alternatives, risks, complications.  -Total contact cast was removed.  It did cause some skin irritation at the swelling went down almost immediately.  I sharply debrided the hyperkeratotic tissue and complications or bleeding.  Also sharply debrided the ingrown toenail fourth nail without any complications or bleeding.  Recommend antibiotic ointment dressing changes to this area daily.  Discussed daily foot inspection.  As the callus did cause irritation she had multiple issues with the cast we will hold off on reapplying this but discussed and stressed importance of transfer of the foot is much as possible, with immobilization in cam boot and elevation.  RTC 10 days or sooner if needed- repeat foot x-ray  Trula Slade DPM

## 2021-04-11 ENCOUNTER — Ambulatory Visit (INDEPENDENT_AMBULATORY_CARE_PROVIDER_SITE_OTHER): Payer: No Typology Code available for payment source

## 2021-04-11 ENCOUNTER — Other Ambulatory Visit: Payer: Self-pay

## 2021-04-11 ENCOUNTER — Encounter: Payer: Self-pay | Admitting: Podiatry

## 2021-04-11 ENCOUNTER — Ambulatory Visit (INDEPENDENT_AMBULATORY_CARE_PROVIDER_SITE_OTHER): Payer: No Typology Code available for payment source | Admitting: Podiatry

## 2021-04-11 DIAGNOSIS — M14672 Charcot's joint, left ankle and foot: Secondary | ICD-10-CM

## 2021-04-11 DIAGNOSIS — E1149 Type 2 diabetes mellitus with other diabetic neurological complication: Secondary | ICD-10-CM | POA: Diagnosis not present

## 2021-04-11 NOTE — Patient Instructions (Signed)
[facility] An [employer] is a type of bandage (dressing) for the foot and leg. The dressing is a gauze wrap that is soaked with a type of medicine called zinc oxide. The gauze may also include other lotions and medicines that help in wound healing, such as calamine. An Unna boot may be used to treat: Open sores (ulcers) on the foot, heel, or leg. Swelling from disorders that affect the veins or lymphatic system (lymphedema). Skin conditions such as chronic inflammation caused by poor blood flow (stasis dermatitis). The dressing is applied by a health care provider. The gauze is wrapped around your lower extremity in several layers, usually starting at the toes and going upward to the knee. A dry outer wrap goes over the medicated wrap for supportand compression.  Before applying the [employer], your health care provider will clean your leg and foot and may apply an antibiotic ointment. You may be asked to raise (elevate) your leg for a while to reduce swelling before the boot is applied. The boot will dry and harden after it is applied. The boot may need to be changed orreplaced about twice a week. Follow these instructions at home: [name] as told by your health care provider. You may need to wear a slipper or shoe over the boot that is one or two sizes larger than normal. Check the skin around the boot every day. Tell your health care provider about any concerns. Do not stick anything inside the boot to scratch your skin. Doing that increases your risk of infection. Keep your [employer] clean and dry. Check every day for signs of infection. Check for: Redness, swelling, or pain in your foot or toes. Fluid or blood coming from the boot. Pus or a bad smell coming from the boot. Remove the boot and call your health care provider if you have signs of poor blood flow, such as: Your toes tingle or become numb. Your toes turn cold or turn blue or pale. Your toes are more swollen  or painful. You are unable to move your toes. Activity You may walk with the boot once it has dried. Ask your health care provider how much walking is safe for you. Avoid sitting for a long time without moving. Get up to take short walks as told by your health care provider. This is important to improve blood flow. Bathing Do not take baths, swim, or use a hot tub until your health care provider approves. Ask your health care provider if you may take showers. If your health care provider approves a bath or a shower, do not let the Unna boot get wet. If you take a shower, cover the boot with a watertight covering. If you take a bath, keep your leg with the boot out of the tub. General instructions Keep your leg elevated above the level of your heart while you are sitting or lying down. This will decrease swelling. Do not sit with your knee bent for long periods of time. Take over-the-counter and prescription medicines only as told by your health care provider. Do not use any products that contain nicotine or tobacco, such as cigarettes, e-cigarettes, and chewing tobacco. These can delay healing. If you need help quitting, ask your health care provider. Keep all follow-up visits as told by your health care provider. This is important. Contact a health care provider if: Your skin feels itchy inside the boot. You have a burning sensation, a rash, or  itchy, red, swollen areas of skin (hives) in the boot area. You have a fever or chills. You have any signs of infection, such as: New redness, swelling, or pain. More fluid or blood coming from the boot. Pus or a bad smell coming from the boot. You have increased numbness or pain in your foot or toes. You have any changes in skin color on your foot or toes, such as the skin turning blue or pale or developing patchy areas with spots. Your boot has been damaged or feels like it is no longer fitting properly. Summary An [name] boot is a type of bandage  (dressing) system for the foot and leg. The dressing is a gauze wrap that is soaked with a type of medicine (zinc oxide) to treat foot, heel, or leg ulcers, swelling from disorders that affect the veins or lymphatic system (lymphedema), and skin conditions caused by poor blood flow (stasis dermatitis). This dressing is applied by a health care provider. After it is applied, the boot will dry and harden. The boot may need to be changed or replaced about twice a week. Let your health care provider know if you have any signs of poor blood flow or infection. This information is not intended to replace advice given to you by your health care provider. Make sure you discuss any questions you have with your healthcare provider. Document Revised: 12/24/2018 Document Reviewed: 05/15/2018 Elsevier Patient Education  [name].

## 2021-04-14 NOTE — Progress Notes (Signed)
Subjective: 50 year old female presents the office today for follow-up evaluation of left foot injury, Charcot.  Swelling is improving not having any significant pain at this time.  No recent injury or falls or changes otherwise.    Objective: AAO x3, NAD DP/PT pulses palpable bilaterally, CRT less than 3 seconds No significant pain of the left lower extremity.  There is decreased edema, some toe swelling although mild.  There is no erythema or warmth of the foot today.  No pain the ankle and ankle range of motion intact.  No significant collapse of the midfoot noted. No significant callus formation today. No other open lesions or signs of infection. No pain with calf compression, swelling, warmth, erythema  Assessment: Left foot Charcot  Plan: -All treatment options discussed with the patient including all alternatives, risks, complications.  -X-rays obtained and reviewed.  Charcot changes present to the talonavicular joint but appears to be stable compared to prior x-rays. -Cast was causing discomfort, irritation of the skin as well follow-up on this.  On continued light duty at work as well as continue mobilization and transfer of the foot is much as possible.  Consider [employer].  Return in about 2 weeks (around 04/25/2021) for Charcot, x-ray.  Trula Slade DPM

## 2021-04-15 ENCOUNTER — Other Ambulatory Visit: Payer: Self-pay | Admitting: Podiatry

## 2021-04-15 ENCOUNTER — Encounter: Payer: Self-pay | Admitting: Podiatry

## 2021-04-15 MED ORDER — AMOXICILLIN-POT CLAVULANATE 875-125 MG PO TABS: 1.0000 | ORAL_TABLET | Freq: Two times a day (BID) | ORAL | 0 refills | Status: DC

## 2021-04-25 ENCOUNTER — Encounter: Payer: Self-pay | Admitting: Podiatry

## 2021-04-25 ENCOUNTER — Other Ambulatory Visit: Payer: Self-pay

## 2021-04-25 ENCOUNTER — Ambulatory Visit (INDEPENDENT_AMBULATORY_CARE_PROVIDER_SITE_OTHER): Payer: No Typology Code available for payment source

## 2021-04-25 ENCOUNTER — Ambulatory Visit (INDEPENDENT_AMBULATORY_CARE_PROVIDER_SITE_OTHER): Payer: No Typology Code available for payment source | Admitting: Podiatry

## 2021-04-25 DIAGNOSIS — S90112A Contusion of left great toe without damage to nail, initial encounter: Secondary | ICD-10-CM

## 2021-04-25 DIAGNOSIS — E1149 Type 2 diabetes mellitus with other diabetic neurological complication: Secondary | ICD-10-CM | POA: Diagnosis not present

## 2021-04-25 DIAGNOSIS — M14672 Charcot's joint, left ankle and foot: Secondary | ICD-10-CM

## 2021-04-27 NOTE — Progress Notes (Signed)
Subjective: 50 year old female presents the office today for follow-up evaluation of left foot injury, Charcot.  States from a Charcot standpoint she is stable and doing well without any pain.  Still gets some swelling but no redness or warmth.  She did hit her left big toe and she sent the message and she was started on Augmentin.  This is been doing better.  No purulence.  No recent fevers or chills.  No other concerns.  Objective: AAO x3, NAD DP/PT pulses palpable bilaterally, CRT less than 3 seconds No significant pain of the left lower extremity.  There is still some mild edema present to the foot, ankle there is no erythema or warmth associated with this.  The left hallux there is some dried blood present with a superficial abrasion but there is no purulence.  Minimal edema.  There is any cellulitis.  No fluctuance or crepitation.  No malodor. No other open lesions or signs of infection. No pain with calf compression, swelling, warmth, erythema  Assessment: Left foot Charcot; left hallux contusion  Plan: -All treatment options discussed with the patient including all alternatives, risks, complications.  -X-rays obtained and reviewed.  Charcot changes present to the talonavicular joint but appears to be stable compared to prior x-rays.  No notes of acute fracture or osteomyelitis of the hallux. -At this point she is stable.  She has been in the cam boot but she is try to stay nonweightbearing much as possible when she is sitting at work.  Cast cause irritation is on hold off on this.  This point at this point is improved.  Try to get her [employer].  In the future may need to have surgical intervention but I would like for this to calm down prior to that. -Continue antibiotic ointment dressing changes left hallux.  Finish course of antibiotics. -Monitor for any clinical signs or symptoms of infection and directed to call the office immediately should any occur or go to the ER. -Follow up for  CROW  No follow-ups on file.  Trula Slade DPM

## 2021-05-09 ENCOUNTER — Encounter: Payer: Self-pay | Admitting: Podiatry

## 2021-05-09 ENCOUNTER — Ambulatory Visit (INDEPENDENT_AMBULATORY_CARE_PROVIDER_SITE_OTHER): Payer: No Typology Code available for payment source

## 2021-05-09 ENCOUNTER — Ambulatory Visit (INDEPENDENT_AMBULATORY_CARE_PROVIDER_SITE_OTHER): Payer: No Typology Code available for payment source | Admitting: Podiatry

## 2021-05-09 ENCOUNTER — Other Ambulatory Visit: Payer: Self-pay

## 2021-05-09 DIAGNOSIS — E1149 Type 2 diabetes mellitus with other diabetic neurological complication: Secondary | ICD-10-CM

## 2021-05-09 DIAGNOSIS — S90112A Contusion of left great toe without damage to nail, initial encounter: Secondary | ICD-10-CM

## 2021-05-09 DIAGNOSIS — M14672 Charcot's joint, left ankle and foot: Secondary | ICD-10-CM | POA: Diagnosis not present

## 2021-05-16 NOTE — Progress Notes (Signed)
Subjective: 50 year old female presents the office today for follow-up evaluation of left foot injury, Charcot.  She states that she is doing good.  She is been in the cam boot.  She gets some swelling.  No increasing redness or warmth that she has noticed.  She states that the left hallux abrasion has healed and she denies any swelling redness or any drainage.  She has had her A1c rechecked it was 9.9.   Objective: AAO x3, NAD DP/PT pulses palpable bilaterally, CRT less than 3 seconds No significant pain of the left lower extremity.  There is still mild edema present but there is no significant erythema or warmth.  The foot appears to be stable and there is no significant collapse of the arch or change in position clinically of the foot.  The area of abrasion on the left hallux is healed.  There is no edema, erythema to the skin there is no drainage or pus. No pain with calf compression, swelling, warmth, erythema  Assessment: Left foot Charcot; left hallux contusion; uncontrolled diabetes  Plan: -All treatment options discussed with the patient including all alternatives, risks, complications.  -X-rays obtained and reviewed.  Charcot changes present to the talonavicular joint but appears to be stable compared to prior x-rays.  Compared to the initial x-rays the talonavicular joint has changed some but for the last couple x-rays has been stable.  Clinically the position of the foot looks good and swelling is much improved.  I want to go and try to get her into a CROW so that way she can get back to work and help stabilize the foot more.  Prescription for [facility] was provided.  At some point she will likely have surgical intervention but discussed glucose control as well. -She reports that she is taking magnesium.  Discussed getting levels checked and she can discuss with her primary care physician magnesium, vitamin D.  Trula Slade DPM

## 2021-05-31 ENCOUNTER — Encounter: Payer: Self-pay | Admitting: Podiatry

## 2021-05-31 ENCOUNTER — Other Ambulatory Visit: Payer: Self-pay

## 2021-05-31 ENCOUNTER — Ambulatory Visit (INDEPENDENT_AMBULATORY_CARE_PROVIDER_SITE_OTHER): Payer: No Typology Code available for payment source | Admitting: Podiatry

## 2021-05-31 DIAGNOSIS — E1149 Type 2 diabetes mellitus with other diabetic neurological complication: Secondary | ICD-10-CM

## 2021-05-31 DIAGNOSIS — M14672 Charcot's joint, left ankle and foot: Secondary | ICD-10-CM

## 2021-05-31 NOTE — Progress Notes (Signed)
Subjective: 50 year old female presents the office today for follow-up evaluation of left foot injury, Charcot.  States that she still having some discomfort of the foot but is not gotten any worse.  She is also pending a psoriatic arthritis flare and she is not sure if some of the joint pain can be coming from that as well.  The pain itself has not worsened and there is never any worsening swelling or redness or any warmth.  No open sores. Awaiting CROW boot.   A1c is 9.9  Objective: AAO x3, NAD DP/PT pulses palpable bilaterally, CRT less than 3 seconds There is still some mild residual edema to the left foot and there is no significant erythema.  There is minimal warmth compared to contra lower extremity.  There is no skin breakdown.  Overall position of the foot has not changed and there is no collapse of the foot.  Ankle range of motion intact. No pain with calf compression, swelling, warmth, erythema  Assessment: Left foot Charcot; left hallux contusion; uncontrolled diabetes  Plan: -All treatment options discussed with the patient including all alternatives, risks, complications.  -Follow-up on repeat x-rays today as she is stable.  Continuing cam boot, limit weightbearing.  She is on restrictions at work to be sitting.  She is awaiting her [employer].  Hopefully once we get the boot and break this and we can start to increase some activity.  Still need to monitor very closely for any breakdown of the skin or collapse of the foot. -Vitamin D level was checked and was 51.3 magnesium 2.1.

## 2021-06-16 NOTE — Telephone Encounter (Signed)
Error

## 2021-06-21 ENCOUNTER — Ambulatory Visit: Payer: No Typology Code available for payment source | Admitting: Podiatry

## 2021-07-05 ENCOUNTER — Encounter: Payer: Self-pay | Admitting: Podiatry

## 2021-07-05 ENCOUNTER — Ambulatory Visit (INDEPENDENT_AMBULATORY_CARE_PROVIDER_SITE_OTHER): Payer: No Typology Code available for payment source | Admitting: Podiatry

## 2021-07-05 ENCOUNTER — Ambulatory Visit (INDEPENDENT_AMBULATORY_CARE_PROVIDER_SITE_OTHER): Payer: No Typology Code available for payment source

## 2021-07-05 ENCOUNTER — Other Ambulatory Visit: Payer: Self-pay

## 2021-07-05 DIAGNOSIS — Z79899 Other long term (current) drug therapy: Secondary | ICD-10-CM | POA: Insufficient documentation

## 2021-07-05 DIAGNOSIS — M2550 Pain in unspecified joint: Secondary | ICD-10-CM | POA: Insufficient documentation

## 2021-07-05 DIAGNOSIS — Z6836 Body mass index (BMI) 36.0-36.9, adult: Secondary | ICD-10-CM | POA: Insufficient documentation

## 2021-07-05 DIAGNOSIS — R5383 Other fatigue: Secondary | ICD-10-CM | POA: Insufficient documentation

## 2021-07-05 DIAGNOSIS — E1142 Type 2 diabetes mellitus with diabetic polyneuropathy: Secondary | ICD-10-CM | POA: Insufficient documentation

## 2021-07-05 DIAGNOSIS — L409 Psoriasis, unspecified: Secondary | ICD-10-CM | POA: Insufficient documentation

## 2021-07-05 DIAGNOSIS — M1460 Charcot's joint, unspecified site: Secondary | ICD-10-CM | POA: Insufficient documentation

## 2021-07-05 DIAGNOSIS — M14672 Charcot's joint, left ankle and foot: Secondary | ICD-10-CM | POA: Diagnosis not present

## 2021-07-05 DIAGNOSIS — E1149 Type 2 diabetes mellitus with other diabetic neurological complication: Secondary | ICD-10-CM | POA: Diagnosis not present

## 2021-07-12 NOTE — Progress Notes (Signed)
Subjective: 50 year old female presents the office today for follow-up evaluation of left foot injury, Charcot.  She did get the [employer] but she states that it is too short and the toe area and 1 strap is  broken.  Otherwise she states that she doing well.  Swelling is improved.  No increase in redness or warmth.  No recent injury.  No other concerns.  A1c is 9.9  Objective: AAO x3, NAD DP/PT pulses palpable bilaterally, CRT less than 3 seconds There is still some mild residual edema to the left foot and there is no significant erythema.  Swelling appears to be some improved compared to last appointment.  There is no significant tenderness palpation.  Clinically the foot is in a rectus position without any significant collapse. No pain with calf compression, swelling, warmth, erythema  Assessment: Left foot Charcot; uncontrolled diabetes  Plan: -All treatment options discussed with the patient including all alternatives, risks, complications.  -Repeat x-rays obtained reviewed.  Appears to be stable.  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 -For now continue with the [facility] boot but recommended her to follow-up with [place] [facility] for modifications.  Discussed to monitor for any signs or symptoms of acute Charcot.  At some point will likely need surgical intervention but would like for A1c to improve before considering any surgical intervention.  Trula Slade DPM

## 2021-07-26 ENCOUNTER — Ambulatory Visit: Payer: No Typology Code available for payment source | Admitting: Podiatry

## 2021-08-01 ENCOUNTER — Encounter: Payer: Self-pay | Admitting: Podiatry

## 2021-08-04 ENCOUNTER — Other Ambulatory Visit: Payer: Self-pay

## 2021-08-04 ENCOUNTER — Ambulatory Visit (INDEPENDENT_AMBULATORY_CARE_PROVIDER_SITE_OTHER): Payer: No Typology Code available for payment source | Admitting: Podiatry

## 2021-08-04 ENCOUNTER — Ambulatory Visit (INDEPENDENT_AMBULATORY_CARE_PROVIDER_SITE_OTHER): Payer: No Typology Code available for payment source

## 2021-08-04 ENCOUNTER — Encounter: Payer: Self-pay | Admitting: Podiatry

## 2021-08-04 DIAGNOSIS — M14672 Charcot's joint, left ankle and foot: Secondary | ICD-10-CM

## 2021-08-04 DIAGNOSIS — E1149 Type 2 diabetes mellitus with other diabetic neurological complication: Secondary | ICD-10-CM

## 2021-08-04 NOTE — Progress Notes (Signed)
Subjective: 50 year old female presents the office today for follow-up evaluation of left foot injury, Charcot.  She said that she is doing well she does not have any significant swelling or redness.  Denies any open sores.  She has been using her [employer] still.  She has no new concerns today.  A1c is 9.9  Objective: AAO x3, NAD DP/PT pulses palpable bilaterally, CRT less than 3 seconds On the left foot there is minimal edema present there is no erythema or warmth of the foot.  There is no open lesions.  There is no significant collapse of the foot.  No pain on exam today.  MMT 5/5. No pain with calf compression, swelling, warmth, erythema  Assessment: Left foot Charcot; uncontrolled diabetes  Plan: -All treatment options discussed with the patient including all alternatives, risks, complications.  -Repeat x-rays obtained reviewed.  Appears to be stable.  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 -At this point we discussed with conservative as well as surgical options.  Given her uncontrolled diabetes would recommend holding off on the surgery.  Continue improvement.  Monitor for any worsening or any active signs of acute Charcot.  Trula Slade DPM

## 2021-09-05 ENCOUNTER — Ambulatory Visit (INDEPENDENT_AMBULATORY_CARE_PROVIDER_SITE_OTHER): Payer: No Typology Code available for payment source | Admitting: Podiatry

## 2021-09-05 ENCOUNTER — Encounter: Payer: Self-pay | Admitting: Podiatry

## 2021-09-05 ENCOUNTER — Ambulatory Visit (INDEPENDENT_AMBULATORY_CARE_PROVIDER_SITE_OTHER): Payer: No Typology Code available for payment source

## 2021-09-05 ENCOUNTER — Other Ambulatory Visit: Payer: Self-pay

## 2021-09-05 DIAGNOSIS — M14672 Charcot's joint, left ankle and foot: Secondary | ICD-10-CM

## 2021-09-08 NOTE — Progress Notes (Signed)
Subjective: 50 year old female presents the office today for follow-up evaluation of left foot injury, Charcot.  This is been doing well.  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  She gets some swelling in the day but no significant pain.  No open sores or any redness.   A1c is 9.9 States that her sugars have been running in the 200s.  Objective: AAO x3, NAD DP/PT pulses palpable bilaterally, CRT less than 3 seconds On the left foot there is minimal edema present there is no erythema or warmth of the foot.  There is no open lesions.  There is no significant collapse of the foot.  No pain on exam today.  MMT 5/5.  Overall seems to be stable. She has trimmed out the ingrown toenail from her left big toenail.  There is no edema, erythema or signs of infection today. No pain with calf compression, swelling, warmth, erythema  Assessment: Left foot Charcot; uncontrolled diabetes  Plan: -All treatment options discussed with the patient including all alternatives, risks, complications.  -Repeat x-rays obtained reviewed.  Appears to be stable in comparison to prior x-rays..  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 -At this point we discussed with conservative as well as surgical options.  Given her uncontrolled diabetes would recommend holding off on the surgery.  Continue with [employer].  Monitor for any worsening or any active signs of acute Charcot.  Trula Slade DPM

## 2021-10-03 ENCOUNTER — Ambulatory Visit (INDEPENDENT_AMBULATORY_CARE_PROVIDER_SITE_OTHER): Payer: No Typology Code available for payment source | Admitting: Podiatry

## 2021-10-03 ENCOUNTER — Encounter: Payer: Self-pay | Admitting: Student

## 2021-10-03 ENCOUNTER — Ambulatory Visit (INDEPENDENT_AMBULATORY_CARE_PROVIDER_SITE_OTHER): Payer: No Typology Code available for payment source

## 2021-10-03 ENCOUNTER — Other Ambulatory Visit: Payer: Self-pay

## 2021-10-03 DIAGNOSIS — E1149 Type 2 diabetes mellitus with other diabetic neurological complication: Secondary | ICD-10-CM

## 2021-10-03 DIAGNOSIS — M14672 Charcot's joint, left ankle and foot: Secondary | ICD-10-CM | POA: Diagnosis not present

## 2021-10-08 NOTE — Progress Notes (Signed)
Subjective: 51 year old female presents the office today for follow-up evaluation of left foot injury, Charcot.  States that she is having little more soreness.  She has been walking some more.  She is still in the [employer].  Swelling has remained but appears to be improving.  No recent injury or changes.  Denies any open sores.  She has no other concerns today.   A1c is 9.9 States that her sugars have been running in the 200s.  Objective: AAO x3, NAD DP/PT pulses palpable bilaterally, CRT less than 3 seconds On the left foot there is minimal edema present there is no erythema or warmth of the foot.  Overall the swelling appears to be stable or improved compared to prior appointments.  Mild diffuse tenderness on midfoot but no specific area of but point tenderness.  There is no open lesions.  There is no significant collapse of the foot.  No pain on exam today.  MMT 5/5.  Overall seems to be stable. She has trimmed out the ingrown toenail from her left big toenail.  There is no edema, erythema or signs of infection today. No pain with calf compression, swelling, warmth, erythema  Assessment: Left foot Charcot; uncontrolled diabetes  Plan: -All treatment options discussed with the patient including all alternatives, risks, complications.  -Repeat x-rays obtained reviewed. 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  No significant collapse noted. -At this point we discussed with conservative as well as surgical options.  Given her uncontrolled diabetes would recommend holding off on the surgery.  Continue with [employer].  Monitor for any worsening or any active signs of acute Charcot.  Continue monitor for any increase in swelling or discomfort in disorder occur to let me know.  Return in about 6 weeks (around 11/14/2021) for charcot, x-ray.  Trula Slade DPM

## 2021-10-31 ENCOUNTER — Encounter: Payer: Self-pay | Admitting: Podiatry

## 2021-10-31 ENCOUNTER — Other Ambulatory Visit: Payer: Self-pay | Admitting: Podiatry

## 2021-10-31 MED ORDER — HYDROCODONE-ACETAMINOPHEN 5-325 MG PO TABS: 1.0000 | ORAL_TABLET | Freq: Four times a day (QID) | ORAL | 0 refills | Status: DC | PRN

## 2021-10-31 NOTE — Progress Notes (Signed)
Vicodin sent

## 2021-11-14 ENCOUNTER — Ambulatory Visit: Payer: No Typology Code available for payment source | Admitting: Podiatry

## 2021-11-21 ENCOUNTER — Other Ambulatory Visit: Payer: Self-pay

## 2021-11-21 ENCOUNTER — Ambulatory Visit (INDEPENDENT_AMBULATORY_CARE_PROVIDER_SITE_OTHER): Payer: No Typology Code available for payment source

## 2021-11-21 ENCOUNTER — Encounter: Payer: Self-pay | Admitting: Podiatry

## 2021-11-21 ENCOUNTER — Ambulatory Visit (INDEPENDENT_AMBULATORY_CARE_PROVIDER_SITE_OTHER): Payer: No Typology Code available for payment source | Admitting: Podiatry

## 2021-11-21 DIAGNOSIS — S91209A Unspecified open wound of unspecified toe(s) with damage to nail, initial encounter: Secondary | ICD-10-CM

## 2021-11-21 DIAGNOSIS — M14672 Charcot's joint, left ankle and foot: Secondary | ICD-10-CM | POA: Diagnosis not present

## 2021-11-22 NOTE — Progress Notes (Signed)
Subjective: ?51 year old female presents the office today for follow-up evaluation of left foot injury, Charcot.  Previously she did have some increased pain to the foot but she has been wearing the cam boot and that has resolved.  She has not seen any increase in swelling or any increase in temperature.  She states the left big toenail did get caught and she did have to pull it off as it became loose.  She has been applying Medihoney to the wound.  Denies any drainage or pus from the area.  No open sores otherwise.  No other concerns. ? ? ?A1c is 9.9 ? ?Objective: ?AAO x3, NAD ?DP/PT pulses palpable bilaterally, CRT less than 3 seconds ?On the left foot there is minimal edema present there is no erythema or warmth of the foot.  There is still some mild residual swelling present the left foot without any significant increased temperature compared to the contralateral extremity.  Foot appears to be stable.  No specific area pinpoint tenderness.  There is no pain to the ankle today.  Dorsal spurring present of the midfoot but no significant collapse noted.  The nail is no longer present left hallux there is a superficial wound present with any drainage or pus or cellulitis noted.  No fluctuation or crepitation.  No malodor. ?No pain with calf compression, swelling, warmth, erythema ? ?Assessment: ?Left foot Charcot; uncontrolled diabetes; traumatic left hallux nail avulsion ? ?Plan: ?-All treatment options discussed with the patient including all alternatives, risks, complications.  ?-Repeat x-rays obtained reviewed.  Chronic changes present to the talonavicular joint, midfoot consistent with Charcot but does not appear to have any significant worsening. ?-Recommend continue immobilization in [place] for now.  Continue to elevate and limit activity. ?-Continue Medihoney dressing changes to the left hallux daily.  Monitor for any signs or symptoms of infection. ? ?Return in about 4 weeks (around 12/19/2021). ? ?Trula Slade DPM ?

## 2021-12-07 ENCOUNTER — Other Ambulatory Visit: Payer: Self-pay | Admitting: Internal Medicine

## 2021-12-07 DIAGNOSIS — Z1231 Encounter for screening mammogram for malignant neoplasm of breast: Secondary | ICD-10-CM

## 2021-12-19 ENCOUNTER — Ambulatory Visit (INDEPENDENT_AMBULATORY_CARE_PROVIDER_SITE_OTHER): Payer: No Typology Code available for payment source | Admitting: Podiatry

## 2021-12-19 ENCOUNTER — Ambulatory Visit (INDEPENDENT_AMBULATORY_CARE_PROVIDER_SITE_OTHER): Payer: No Typology Code available for payment source

## 2021-12-19 ENCOUNTER — Encounter: Payer: Self-pay | Admitting: Podiatry

## 2021-12-19 DIAGNOSIS — S91209D Unspecified open wound of unspecified toe(s) with damage to nail, subsequent encounter: Secondary | ICD-10-CM

## 2021-12-19 DIAGNOSIS — M14672 Charcot's joint, left ankle and foot: Secondary | ICD-10-CM

## 2021-12-26 DIAGNOSIS — M14672 Charcot's joint, left ankle and foot: Secondary | ICD-10-CM | POA: Insufficient documentation

## 2021-12-26 NOTE — Progress Notes (Signed)
Subjective: ?51 year old female presents the office today for follow-up evaluation of left foot injury, Charcot.  States that she has been doing well.  She has not seen any increase in swelling or redness and no increased temperature to her foot.  No new ulcerations noted.  She denies any fevers or chills.  She states her blood sugar has been high as she has not had her insulin. ? ?A1c is 9.9 ? ?Objective: ?AAO x3, NAD ?DP/PT pulses palpable bilaterally, CRT less than 3 seconds ?On the left foot there is minimal edema present there is no erythema or warmth of the foot.  Overall the swelling X appears to be improved compared to last appointment but there is no significant erythema there is no increased temperature gradient to the foot.  The foot is rectus without any significant collapse.  On the dorsal aspect the hallux is dried hyperkeratotic tissue from where the nails come off but there is no edema, erythema or signs of infection to this area. ?No pain with calf compression, swelling, warmth, erythema ? ?Assessment: ?Left foot Charcot; uncontrolled diabetes; traumatic left hallux nail avulsion ? ?Plan: ?-All treatment options discussed with the patient including all alternatives, risks, complications.  ?-Repeat x-rays obtained reviewed.  Chronic changes present to the talonavicular joint, midfoot consistent with Charcot.  No significant collapse noted. ?-Continue recommend mobilization of the [employer] for now.  Continue to elevate and limit activity. ?-Monitor the left hallux.  Can apply a small amount of moisturizer.   ?-Monitor any signs or symptoms of infection.  Discussed glucose control. ? ?Return in about 4 weeks (around 01/16/2022). ? ?Trula Slade DPM ?

## 2022-01-19 ENCOUNTER — Telehealth: Payer: Self-pay | Admitting: Podiatry

## 2022-01-19 ENCOUNTER — Encounter: Payer: Self-pay | Admitting: Podiatry

## 2022-01-19 NOTE — Telephone Encounter (Signed)
Pt called in and stated that she will not be able to make her appt on Monday, 5/8 @ 430pm bc she has to work. She requested an appt for tomorrow but your schedule is booked. She asked me to send a message to see if you would approve her coming in tomorrow. Please advise ?

## 2022-01-20 ENCOUNTER — Ambulatory Visit: Payer: PRIVATE HEALTH INSURANCE | Admitting: Podiatry

## 2022-01-23 ENCOUNTER — Ambulatory Visit: Payer: PRIVATE HEALTH INSURANCE | Admitting: Podiatry

## 2022-01-31 ENCOUNTER — Ambulatory Visit (INDEPENDENT_AMBULATORY_CARE_PROVIDER_SITE_OTHER): Payer: No Typology Code available for payment source | Admitting: Podiatry

## 2022-01-31 ENCOUNTER — Encounter: Payer: Self-pay | Admitting: Podiatry

## 2022-01-31 ENCOUNTER — Ambulatory Visit (INDEPENDENT_AMBULATORY_CARE_PROVIDER_SITE_OTHER): Payer: No Typology Code available for payment source

## 2022-01-31 DIAGNOSIS — M14672 Charcot's joint, left ankle and foot: Secondary | ICD-10-CM

## 2022-01-31 DIAGNOSIS — E1149 Type 2 diabetes mellitus with other diabetic neurological complication: Secondary | ICD-10-CM | POA: Diagnosis not present

## 2022-02-04 NOTE — Progress Notes (Signed)
Subjective: 51 year old female presents the office today for follow-up evaluation of left Charcot.  She states that she has been doing well she has not had any significant increased pain.  She has some occasional discomfort but no significant pain.  She has been using the [employer] still.  She is asking to try to come out of the boot.  No ulcerations that she reports no new concerns.  A1c is 9.9-states her daily blood sugars have been better.  Her average this week was 185 and last week was 178.  Objective: AAO x3, NAD DP/PT pulses palpable bilaterally, CRT less than 3 seconds On the left foot there is minimal edema present there is no erythema or warmth of the foot.  There is no significant pain on exam and clinically the foot is in a rectus position.  Occasionally she gets some discomfort in the ankle but no significant pain today.  There is no open lesions noted bilaterally. No pain with calf compression, swelling, warmth, erythema  Assessment: Left foot Charcot; uncontrolled diabetes;   Plan: -All treatment options discussed with the patient including all alternatives, risks, complications.  -X-rays obtained reviewed.  3 views of the foot and 2 views of the ankle were obtained.  Chronic Charcot changes with fragmentation navicular noted but does not appear to be significantly worsened compared to prior x-rays. 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  Discussed with her that at home she will try to wear a good supportive sneaker.  However if at any point there is any increasing swelling, redness she needs to return to the cam boot immediately.  Return in about 6 weeks (around 03/14/2022).  Trula Slade DPM

## 2022-02-06 ENCOUNTER — Ambulatory Visit
Admission: RE | Admit: 2022-02-06 | Discharge: 2022-02-06 | Disposition: A | Discharge status: Home / Self Care | Payer: No Typology Code available for payment source | Source: Ambulatory Visit | Attending: Internal Medicine | Admitting: Internal Medicine

## 2022-02-06 DIAGNOSIS — Z1231 Encounter for screening mammogram for malignant neoplasm of breast: Secondary | ICD-10-CM

## 2022-02-20 ENCOUNTER — Encounter: Payer: Self-pay | Admitting: Podiatry

## 2022-03-13 ENCOUNTER — Encounter: Payer: Self-pay | Admitting: Podiatry

## 2022-03-13 ENCOUNTER — Ambulatory Visit (INDEPENDENT_AMBULATORY_CARE_PROVIDER_SITE_OTHER): Payer: No Typology Code available for payment source | Admitting: Podiatry

## 2022-03-13 ENCOUNTER — Ambulatory Visit (INDEPENDENT_AMBULATORY_CARE_PROVIDER_SITE_OTHER): Payer: No Typology Code available for payment source

## 2022-03-13 DIAGNOSIS — E1149 Type 2 diabetes mellitus with other diabetic neurological complication: Secondary | ICD-10-CM | POA: Diagnosis not present

## 2022-03-13 DIAGNOSIS — M14672 Charcot's joint, left ankle and foot: Secondary | ICD-10-CM | POA: Diagnosis not present

## 2022-03-20 NOTE — Progress Notes (Signed)
Subjective: 51 year old female presents the office today for follow-up evaluation of left Charcot.  States that she has been doing well.  Still has some mild swelling but overall improved.  No pain.  No recent injuries.  She wears the [employer] while at work and she wears a regular shoe at home.   She has noticed a new spot on the inside of her third toe.  No open lesions or drainage.  Sugars have been better controlled.  Based on the report that she brings in from her Dexcom her GMI is 7.2 and average glucose of 163.  Objective: AAO x3, NAD DP/PT pulses palpable bilaterally, CRT less than 3 seconds On the left foot there is minimal edema present there is no erythema or warmth of the foot.  There is no pain on exam and clinically the foot is in a rectus position.  Occasionally she gets some discomfort in the ankle but no significant pain today.  Callus formation present on the lateral aspect of the left third toe today.  There is no underlying ulceration drainage or any signs of infection.  There is no open lesions noted bilaterally. No pain with calf compression, swelling, warmth, erythema  Assessment: Left foot Charcot; uncontrolled diabetes; preulcerative callus  Plan: -All treatment options discussed with the patient including all alternatives, risks, complications.  -X-rays obtained reviewed.  3 views were obtained.  There is fragmentation talonavicular joint but position does not appear to be significantly changed.  When comparing to the initial x-rays it has changed but seems to be stable more recently. -Clinically she is also improving.  We discussed that she can start to wear a regular shoe at work half days but should she see any increase in swelling or any discomfort she is to go back to the [employer].  Continue compression to help with any edema. -Offloading pad.  I did sharply be the minimal calyceal any complications or bleeding.  Return in about 6 weeks (around 04/24/2022) for  charcot, x-ray.  Trula Slade DPM

## 2022-04-28 ENCOUNTER — Encounter: Payer: Self-pay | Admitting: Podiatry

## 2022-04-28 ENCOUNTER — Ambulatory Visit (INDEPENDENT_AMBULATORY_CARE_PROVIDER_SITE_OTHER): Payer: No Typology Code available for payment source | Admitting: Podiatry

## 2022-04-28 ENCOUNTER — Ambulatory Visit (INDEPENDENT_AMBULATORY_CARE_PROVIDER_SITE_OTHER): Payer: No Typology Code available for payment source

## 2022-04-28 DIAGNOSIS — E1149 Type 2 diabetes mellitus with other diabetic neurological complication: Secondary | ICD-10-CM | POA: Diagnosis not present

## 2022-04-28 DIAGNOSIS — M14672 Charcot's joint, left ankle and foot: Secondary | ICD-10-CM

## 2022-04-28 NOTE — Progress Notes (Unsigned)
Subjective:  Chief Complaint  Patient presents with   Foot Pain    Pt came in today for charcot left, pt is having some pain, pain rate is a 5 out of 10, twisted ankle over the weekend, Xrays taken today    She feel straight down in a regulr shoe. No increase in swelling.  51 year old female presents the office today for follow-up evaluation of left Charcot.  States that she has been doing well.  Still has some mild swelling but overall improved.  No pain.  No recent injuries.  She wears the [employer] while at work and she wears a regular shoe at home.   She has noticed a new spot on the inside of her third toe.  No open lesions or drainage.  A1c is 7.7  Objective: AAO x3, NAD DP/PT pulses palpable bilaterally, CRT less than 3 seconds On the left foot there is minimal edema present there is no erythema or warmth of the foot.  There is no pain on exam and clinically the foot is in a rectus position.  Occasionally she gets some discomfort in the ankle but no significant pain today.  Callus formation present on the lateral aspect of the left third toe today.  There is no underlying ulceration drainage or any signs of infection.  There is no open lesions noted bilaterally. No pain with calf compression, swelling, warmth, erythema  Assessment: Left foot Charcot; uncontrolled diabetes; preulcerative callus  Plan: -All treatment options discussed with the patient including all alternatives, risks, complications.  -X-rays obtained reviewed.  3 views were obtained.  There is fragmentation talonavicular joint but position does not appear to be significantly changed.  When comparing to the initial x-rays it has changed but seems to be stable more recently. -Clinically she is also improving.  We discussed that she can start to wear a regular shoe at work half days but should she see any increase in swelling or any discomfort she is to go back to the [employer].  Continue compression to help with any  edema. -Offloading pad.  I did sharply be the minimal calyceal any complications or bleeding.  Return in about 6 weeks (around 04/24/2022) for charcot, x-ray.  Trula Slade DPM

## 2022-06-06 ENCOUNTER — Other Ambulatory Visit: Payer: Self-pay

## 2022-06-07 ENCOUNTER — Other Ambulatory Visit: Payer: Self-pay

## 2022-06-07 MED ORDER — CLOTRIMAZOLE 10 MG MT TROC
10.0000 mg | Freq: Every day | OROMUCOSAL | 0 refills | Status: DC
  Filled 2022-06-07 – 2022-06-29 (×2): qty 70, 14d supply, fill #0

## 2022-06-08 ENCOUNTER — Other Ambulatory Visit: Payer: Self-pay

## 2022-06-09 ENCOUNTER — Other Ambulatory Visit: Payer: Self-pay

## 2022-06-13 ENCOUNTER — Ambulatory Visit (INDEPENDENT_AMBULATORY_CARE_PROVIDER_SITE_OTHER): Payer: No Typology Code available for payment source | Admitting: Podiatry

## 2022-06-13 ENCOUNTER — Encounter: Payer: Self-pay | Admitting: Podiatry

## 2022-06-13 ENCOUNTER — Ambulatory Visit (INDEPENDENT_AMBULATORY_CARE_PROVIDER_SITE_OTHER): Payer: No Typology Code available for payment source

## 2022-06-13 DIAGNOSIS — M14672 Charcot's joint, left ankle and foot: Secondary | ICD-10-CM | POA: Diagnosis not present

## 2022-06-13 DIAGNOSIS — L600 Ingrowing nail: Secondary | ICD-10-CM

## 2022-06-16 ENCOUNTER — Other Ambulatory Visit: Payer: Self-pay

## 2022-06-16 NOTE — Progress Notes (Signed)
Subjective:  Chief Complaint  Patient presents with   Diabetes    Diabetic A1c-7.8 BG- 150 (yesterday) Patient denies nay pain X-rays taken today     51 year old female presents with the above complaints.  She states that she is doing well.  She denies any increasing swelling or redness.  She has no pain.  She is back to her regular shoe full-time.  Also states that she did take off the ingrown toenail left big toe.  States it has been healing well.  No drainage or pus.    Objective: AAO x3, NAD DP/PT pulses palpable bilaterally, CRT less than 3 seconds On the left foot there is minimal edema present there is no erythema or warmth of the foot.  Foot clinically appears to be stable.  Foot is rectus.  Small granulation tissue present on the nailbed on the left foot she has remove the ingrown toenail.  Minimal edema and erythema but there is no drainage or pus.  This is likely more from inflammation as opposed to infection.  There is no fluctuation or crepitation.  There is no malodor. No pain with calf compression, swelling, warmth, erythema  Assessment: Left foot Charcot; ingrown toenail left hallux  Plan: -All treatment options discussed with the patient including all alternatives, risks, complications.  -X-rays were obtained and reviewed.  3 views left foot were obtained.  Notes acute fracture.  Subluxation of the long toe navicular joint.  No evidence of acute fracture. -Clinically she appears to be doing well and she is stable.  She is back on a regular shoe.  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  Discussed redness is much as possible.  Should she notice any increase in swelling or redness or any active signs of Charcot treatment turn to the boot. -Recommended antibiotic ointment to the hallux on the left side.  Monitor for any signs or symptoms of infection.  Return in about 9 weeks (around  08/15/2022) for Charcot . X-ray next appt  Trula Slade DPM

## 2022-06-25 ENCOUNTER — Emergency Department (HOSPITAL_BASED_OUTPATIENT_CLINIC_OR_DEPARTMENT_OTHER)
Admission: EM | Admit: 2022-06-25 | Discharge: 2022-06-25 | Disposition: A | Discharge status: Home / Self Care | Payer: No Typology Code available for payment source | Attending: Emergency Medicine | Admitting: Emergency Medicine

## 2022-06-25 ENCOUNTER — Other Ambulatory Visit: Payer: Self-pay

## 2022-06-25 ENCOUNTER — Encounter (HOSPITAL_BASED_OUTPATIENT_CLINIC_OR_DEPARTMENT_OTHER): Payer: Self-pay | Admitting: Emergency Medicine

## 2022-06-25 DIAGNOSIS — E119 Type 2 diabetes mellitus without complications: Secondary | ICD-10-CM | POA: Diagnosis not present

## 2022-06-25 DIAGNOSIS — L03221 Cellulitis of neck: Secondary | ICD-10-CM | POA: Insufficient documentation

## 2022-06-25 MED ORDER — DOXYCYCLINE HYCLATE 100 MG PO CAPS: 100.0000 mg | ORAL_CAPSULE | Freq: Two times a day (BID) | ORAL | 0 refills | Status: AC

## 2022-06-25 MED ORDER — DOXYCYCLINE HYCLATE 100 MG PO TABS
100.0000 mg | ORAL_TABLET | Freq: Once | ORAL | Status: AC
  Administered 2022-06-25: 100 mg via ORAL
  Filled 2022-06-25: qty 1

## 2022-06-25 NOTE — ED Triage Notes (Signed)
Left lump/bump under left chin. Came up yesterday. Red and inflamed today

## 2022-06-25 NOTE — ED Notes (Signed)
Levada Dy - Triage RN aware of pt's pulse

## 2022-06-25 NOTE — ED Notes (Signed)
Dc instructions reviewed with patient. Patient voiced understanding. Dc with belongings.  °

## 2022-06-25 NOTE — ED Provider Notes (Signed)
Emergency Department Provider Note   I have reviewed the triage vital signs and the nursing notes.   HISTORY  Chief Complaint Abscess   HPI Aayushi Solorzano is a 51 y.o. female with past history reviewed below including diabetes presents to the emergency department for evaluation of redness to the left neck.  She has had several days of symptoms which began as a small pimple type area which she squeezed with some minimal drainage.  She is developed some hardened, surrounding area and erythema.  No fevers or chills.  No severe headaches.  No body aches, fatigue, lightheadedness.  No difficulty swallowing or breathing. No dental pain.    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 Cardiovascular: Denies chest pain. Respiratory: Denies shortness of breath. Gastrointestinal: No abdominal pain. Skin: Rash to neck.   ____________________________________________   PHYSICAL EXAM:  VITAL SIGNS: ED Triage Vitals  Enc Vitals Group     BP 06/25/22 1547 134/70     Pulse Rate 06/25/22 1547 (!) 126     Resp 06/25/22 1547 18     Temp 06/25/22 1547 99 F (37.2 C)     Temp Source 06/25/22 1547 Oral     SpO2 06/25/22 1547 98 %    Constitutional: Alert and oriented. Well appearing and in no acute distress. Eyes: Conjunctivae are normal.  Head: Atraumatic. Nose: No congestion/rhinnorhea. Mouth/Throat: Mucous membranes are moist.  Oropharynx non-erythematous. Neck: No stridor.  Mild induration surrounding  a pinpoint area of skin breakdown.  No deeper ulceration or fluctuance.  Submandibular compartment is soft.  Cardiovascular: Good peripheral circulation.  Respiratory: Normal respiratory effort.   Gastrointestinal: No distention.  Musculoskeletal: No gross deformities of extremities. Neurologic:  Normal speech and language.  Skin:  Skin is warm and dry. Neck skin exam as above.    ____________________________________________   PROCEDURES  Procedure(s) performed:   Procedures  EMERGENCY DEPARTMENT US SOFT TISSUE INTERPRETATION "Study: Limited Soft Tissue Ultrasound"  INDICATIONS: Pain and Soft tissue infection Multiple views of the body part were obtained in real-time with a multi-frequency linear probe PERFORMED BY:  Myself SIDE:Left BODY PART:Neck FINDINGS: No abcess noted and Cellulitis present INTERPRETATION:  No abcess noted and Cellulitis present   CPT: Neck 29562-13   ____________________________________________   INITIAL IMPRESSION / ASSESSMENT AND PLAN / ED COURSE  Pertinent labs & imaging results that were available during my care of the patient were reviewed by me and considered in my medical decision making (see chart for details).   This patient is Presenting for Evaluation of rash, which does require a range of treatment options, and is a complaint that involves a high risk of morbidity and mortality.  The Differential Diagnoses include cellulitis, abscess, insect bite, allergic rash, etc.   Critical Interventions-    Medications  doxycycline (VIBRA-TABS) tablet 100 mg (has no administration in time range)    Medical Decision Making: Summary:  Patient presents emergency department for  evaluation of redness and mild swelling to the left submandibular space.  No dental pain to suspect developing Ludwig's angina.  I performed a bedside ultrasound I do not see area of fluid collection which I would be able to drain at the bedside.  Plan for antibiotic  treatment as an outpatient along with warm compress.  Patient to follow with her primary care physician in 48 hours for wound reevaluation.  Discussed strict ED return precautions.  Disposition: discharge  ____________________________________________  FINAL CLINICAL IMPRESSION(S) / ED DIAGNOSES  Final diagnoses:  Cellulitis of neck     NEW OUTPATIENT MEDICATIONS STARTED DURING THIS VISIT:  New Prescriptions   DOXYCYCLINE (VIBRAMYCIN) 100 MG CAPSULE    Take 1 capsule (100 mg total) by mouth 2 (two) times daily for 7 days.    Note:  This document was prepared using Dragon voice recognition software and may include unintentional dictation errors.  Nanda Quinton, MD, [facility] Emergency [facility]    Tremond Shimabukuro, Wonda Olds, MD 06/25/22 1726

## 2022-06-25 NOTE — Discharge Instructions (Signed)
Please continue the antibiotics along with warm compress to the area.  Please follow with your primary care physician in the coming 48 hours to make sure this is improving.  If you develop any new or suddenly worsening symptoms please return to the emergency department immediately for reevaluation.

## 2022-06-29 ENCOUNTER — Other Ambulatory Visit (HOSPITAL_COMMUNITY): Payer: Self-pay

## 2022-06-29 ENCOUNTER — Other Ambulatory Visit: Payer: Self-pay

## 2022-06-30 ENCOUNTER — Other Ambulatory Visit: Payer: Self-pay

## 2022-08-18 ENCOUNTER — Encounter: Payer: Self-pay | Admitting: Podiatry

## 2022-08-18 ENCOUNTER — Other Ambulatory Visit: Payer: Self-pay | Admitting: Podiatry

## 2022-08-18 MED ORDER — SULFAMETHOXAZOLE-TRIMETHOPRIM 800-160 MG PO TABS: 1.0000 | ORAL_TABLET | Freq: Two times a day (BID) | ORAL | 0 refills | Status: DC

## 2022-08-21 ENCOUNTER — Ambulatory Visit (INDEPENDENT_AMBULATORY_CARE_PROVIDER_SITE_OTHER): Payer: No Typology Code available for payment source

## 2022-08-21 ENCOUNTER — Encounter: Payer: Self-pay | Admitting: Podiatry

## 2022-08-21 ENCOUNTER — Other Ambulatory Visit: Payer: Self-pay | Admitting: Podiatry

## 2022-08-21 ENCOUNTER — Ambulatory Visit (INDEPENDENT_AMBULATORY_CARE_PROVIDER_SITE_OTHER): Payer: No Typology Code available for payment source | Admitting: Podiatry

## 2022-08-21 DIAGNOSIS — M14672 Charcot's joint, left ankle and foot: Secondary | ICD-10-CM

## 2022-08-21 DIAGNOSIS — L03031 Cellulitis of right toe: Secondary | ICD-10-CM

## 2022-08-25 ENCOUNTER — Encounter: Payer: Self-pay | Admitting: Podiatry

## 2022-08-25 ENCOUNTER — Other Ambulatory Visit: Payer: Self-pay | Admitting: Podiatry

## 2022-08-25 MED ORDER — SULFAMETHOXAZOLE-TRIMETHOPRIM 800-160 MG PO TABS: 1.0000 | ORAL_TABLET | Freq: Two times a day (BID) | ORAL | 0 refills | Status: DC

## 2022-08-28 NOTE — Progress Notes (Signed)
Subjective:  Chief Complaint  Patient presents with   Diabetes    Patient came in today for left foot Charcot,     51 year old female presents with the above complaints.  Revision she was coming in for Charcot of the left foot however she has tried cutting her right second toe has become swollen and red.  No drainage or pus.  No fevers or chills.  No red streaks.  Objective: AAO x3, NAD DP/PT pulses palpable bilaterally, CRT less than 3 seconds On the left foot there is minimal edema present there is no erythema or warmth of the foot.  This appears to be stable.  Main concern today is the right second toe there is edema and erythema present to the toe and there is a granular wound present distal aspect the toe measuring 0.6 x 0.6 x 0.1 cm without any probing to bone, undermining or tunneling.  There is no fluctuation or crepitation.  No ascending status. No pain with calf compression, swelling, warmth, erythema   Assessment: Left foot Charcot; right second toe cellulitis  Plan: -All treatment options discussed with the patient including all alternatives, risks, complications.  -X-rays were obtained and reviewed.  3 views right foot were obtained.  No definitive cortical changes suggestive of osteomyelitis at this time.  No soft tissue edema. -Quite concerned about the tail of the right foot.  Bactrim prescribed.  Offloading.  She has a surgical shoe at home.  Medical images and changes to the wound. -Monitor for any clinical signs or symptoms of infection and directed to call the office immediately should any occur or go to the ER.  Return in about 10 days (around 08/31/2022) for toe infection right 2nd; repeat x-ray.  Trula Slade DPM

## 2022-09-07 ENCOUNTER — Encounter: Payer: Self-pay | Admitting: Podiatry

## 2022-09-07 ENCOUNTER — Ambulatory Visit (INDEPENDENT_AMBULATORY_CARE_PROVIDER_SITE_OTHER): Payer: No Typology Code available for payment source | Admitting: Podiatry

## 2022-09-07 ENCOUNTER — Ambulatory Visit (INDEPENDENT_AMBULATORY_CARE_PROVIDER_SITE_OTHER): Payer: No Typology Code available for payment source

## 2022-09-07 VITALS — BP 137/62

## 2022-09-07 DIAGNOSIS — L03039 Cellulitis of unspecified toe: Secondary | ICD-10-CM

## 2022-09-07 DIAGNOSIS — L03031 Cellulitis of right toe: Secondary | ICD-10-CM

## 2022-09-07 DIAGNOSIS — L03032 Cellulitis of left toe: Secondary | ICD-10-CM | POA: Diagnosis not present

## 2022-09-07 DIAGNOSIS — M2041 Other hammer toe(s) (acquired), right foot: Secondary | ICD-10-CM

## 2022-09-07 MED ORDER — SILVER SULFADIAZINE 1 % EX CREA: 1.0000 | TOPICAL_CREAM | Freq: Every day | CUTANEOUS | 0 refills | Status: DC

## 2022-09-09 NOTE — Progress Notes (Signed)
Subjective: Chief Complaint  Patient presents with   Foot Pain    Rm 13 Follow up Charcot of left toe. Pt states she is doing feeling well. No new concerns or questions. Pt states there is some viscus drainage but nothing concerning.      51 year old female presents with the above complaints.  She presents today for follow-up evaluation of the infection of the right second toe.  She states the swelling the redness is getting better.  Denies any fevers or chills.     Objective: AAO x3, NAD DP/PT pulses palpable bilaterally, CRT less than 3 seconds On the right foot there is still granular wound at the distal aspect the toe which is about the same in size measuring 0.5 x 0.5 x 0.1 cm without any probing to bone, undermining or tunneling.  There is still edema and mild erythema to the toe but does appear to be improving.  There is no ascending cellulitis.  Hammertoes present.  There is no fluctuance or crepitation.  No pain with calf compression, swelling, warmth, erythema   Assessment: Right second toe cellulitis; hammertoe right foot  Plan: -All treatment options discussed with the patient including all alternatives, risks, complications.  -X-rays were obtained and reviewed.  3 views right foot were obtained.  No definitive cortical changes suggestive of osteomyelitis at this time.  No soft tissue emphysema. -Although the edema and erythema is improving to the right second toe with still remains some still concerned with ulceration that continues.  I do think a lot of this has to do with the hammertoe.  We discussed different treatment options that would help save the toe but it seems she is still at risk of amputation.  We discussed attempting a flexor tenotomy today in the office and she wishes to proceed and consent was obtained.  We discussed alternatives risks and complications.  The skin was cleaned with alcohol and 3 mL of lidocaine, Marcaine plain was infiltrated in a digital block  fashion.  Skin was then prepped with Betadine.  I then introduced an 18-gauge needle to the plantar aspect the toe to transect the flexor tendon.  The toe was sitting in a somewhat more rectus position.  Hemostasis achieved.  Antibiotic ointment was applied to the wound as well as the puncture wound.  Dressing applied.  She tolerated well.  Postprocedure instructions discussed.  Monitor for any signs or symptoms of infection. -On her to continue antibiotics.  She states that she has a prescription at the pharmacy still.  She was using old antibiotics that she already had at home.  If she needs a refill to let me know. -Monitor for any clinical signs or symptoms of infection and directed to call the office immediately should any occur or go to the ER.  Return in about 2 weeks (around 09/21/2022) for toe ulcer.  Trula Slade DPM

## 2022-09-12 ENCOUNTER — Other Ambulatory Visit: Payer: Self-pay | Admitting: Podiatry

## 2022-09-12 DIAGNOSIS — M14672 Charcot's joint, left ankle and foot: Secondary | ICD-10-CM

## 2022-09-12 DIAGNOSIS — L03031 Cellulitis of right toe: Secondary | ICD-10-CM

## 2022-09-13 ENCOUNTER — Other Ambulatory Visit: Payer: Self-pay | Admitting: Podiatry

## 2022-09-13 DIAGNOSIS — M14672 Charcot's joint, left ankle and foot: Secondary | ICD-10-CM

## 2022-09-13 DIAGNOSIS — L03031 Cellulitis of right toe: Secondary | ICD-10-CM

## 2022-09-22 ENCOUNTER — Encounter: Payer: Self-pay | Admitting: Podiatry

## 2022-09-22 ENCOUNTER — Ambulatory Visit (INDEPENDENT_AMBULATORY_CARE_PROVIDER_SITE_OTHER): Payer: No Typology Code available for payment source | Admitting: Podiatry

## 2022-09-22 VITALS — BP 115/84 | HR 88

## 2022-09-22 DIAGNOSIS — L97512 Non-pressure chronic ulcer of other part of right foot with fat layer exposed: Secondary | ICD-10-CM | POA: Diagnosis not present

## 2022-09-22 DIAGNOSIS — L03031 Cellulitis of right toe: Secondary | ICD-10-CM

## 2022-09-25 ENCOUNTER — Encounter: Payer: Self-pay | Admitting: Podiatry

## 2022-09-25 NOTE — Progress Notes (Signed)
Subjective: Chief Complaint  Patient presents with   Diabetic Ulcer    Diabetic, A1c- BG- left 5th toe ulcer       52 year old female presents with the above complaints.  She states that the second toe is doing better.  Still has a wound still some swelling but the redness is improved.  No purulence.  No red streaks.  Denies any fevers or chills.   Objective: AAO x3, NAD DP/PT pulses palpable bilaterally, CRT less than 3 seconds On the right foot there is still granular wound present the distal aspect of the toe.  Smaller measuring 0.3 x 0.3 x 0.1 cm.  Still some localized edema and erythema to the distal portion of toe but appears to be improved.  There is no ascending cellulitis.  No drainage or pus.  No fluctuance or crepitation.  No malodor. No pain with calf compression, swelling, warmth, erythema   Assessment: Right second toe cellulitis; hammertoe right foot-improving  Plan: -All treatment options discussed with the patient including all alternatives, risks, complications.  -Medically necessary wound debridement was performed today.  Today I sharply debrided the wound utilizing a 312 with scalpel to remove nonviable devitalized tissue to promote wound healing.  There is normal blood loss and hemostasis achieved through manual compression.  Clean with saline.  Silvadene was applied followed by dressing.  Continue daily dressing changes, offloading at all times. -Monitor for any clinical signs or symptoms of infection and directed to call the office immediately should any occur or go to the ER.  Return in about 3 weeks (around 10/13/2022) for right 2nd toe ulcer.  X-ray next appointment  Trula Slade DPM

## 2022-09-29 ENCOUNTER — Telehealth: Payer: Self-pay | Admitting: Family

## 2022-10-01 ENCOUNTER — Encounter: Payer: Self-pay | Admitting: Podiatry

## 2022-10-13 ENCOUNTER — Ambulatory Visit (INDEPENDENT_AMBULATORY_CARE_PROVIDER_SITE_OTHER): Payer: No Typology Code available for payment source | Admitting: Podiatry

## 2022-10-13 ENCOUNTER — Encounter: Payer: Self-pay | Admitting: Podiatry

## 2022-10-13 DIAGNOSIS — L97512 Non-pressure chronic ulcer of other part of right foot with fat layer exposed: Secondary | ICD-10-CM

## 2022-10-13 NOTE — Progress Notes (Unsigned)
Subjective: Chief Complaint  Patient presents with   Follow-up    Right foot, 2nd digit ulcer check     52 year old female presents with the above complaints.  She states that the second toe is doing better.  She has been silvadeine on the ulcer.   Objective: AAO x3, NAD DP/PT pulses palpable bilaterally, CRT less than 3 seconds On the right foot there is still granular wound present the distal aspect of the toe.  Smaller measuring ***.  Still some localized edema and erythema to the distal portion of toe but appears to be improved.  There is no ascending cellulitis.  No drainage or pus.  No fluctuance or crepitation.  No malodor. No pain with calf compression, swelling, warmth, erythema   Assessment: Right second toe cellulitis; hammertoe right foot-improving  Plan: -All treatment options discussed with the patient including all alternatives, risks, complications.  -Medically necessary wound debridement was performed today.  Today I sharply debrided the wound utilizing a 312 with scalpel to remove nonviable devitalized tissue to promote wound healing.  There is normal blood loss and hemostasis achieved through manual compression.  Clean with saline.  Silvadene was applied followed by dressing.  Continue daily dressing changes, offloading at all times. -Monitor for any clinical signs or symptoms of infection and directed to call the office immediately should any occur or go to the ER.  Return in about 3 weeks (around 10/13/2022) for right 2nd toe ulcer.  X-ray next appointment  Trula Slade DPM

## 2022-10-17 ENCOUNTER — Encounter: Payer: Self-pay | Admitting: Podiatry

## 2022-10-31 ENCOUNTER — Ambulatory Visit: Payer: No Typology Code available for payment source | Admitting: Podiatry

## 2022-11-03 ENCOUNTER — Encounter: Payer: Self-pay | Admitting: Podiatry

## 2022-11-03 ENCOUNTER — Ambulatory Visit (INDEPENDENT_AMBULATORY_CARE_PROVIDER_SITE_OTHER): Payer: No Typology Code available for payment source

## 2022-11-03 ENCOUNTER — Ambulatory Visit: Payer: No Typology Code available for payment source | Admitting: Podiatry

## 2022-11-03 DIAGNOSIS — L97511 Non-pressure chronic ulcer of other part of right foot limited to breakdown of skin: Secondary | ICD-10-CM | POA: Diagnosis not present

## 2022-11-03 DIAGNOSIS — L97512 Non-pressure chronic ulcer of other part of right foot with fat layer exposed: Secondary | ICD-10-CM

## 2022-11-03 NOTE — Progress Notes (Unsigned)
pinpoint

## 2022-11-24 ENCOUNTER — Telehealth: Payer: Self-pay | Admitting: Podiatry

## 2022-11-24 NOTE — Telephone Encounter (Signed)
Patient called and stated that her right foot great toe callus has come off and some drainage.  Patient has been added to the schedule for Monday but she is starting her antibiotics and she has cleaned the area this morning, she put betadine and bandaged it.   Please advise

## 2022-11-27 ENCOUNTER — Ambulatory Visit (INDEPENDENT_AMBULATORY_CARE_PROVIDER_SITE_OTHER): Payer: No Typology Code available for payment source

## 2022-11-27 ENCOUNTER — Encounter: Payer: Self-pay | Admitting: Podiatry

## 2022-11-27 ENCOUNTER — Ambulatory Visit: Payer: No Typology Code available for payment source | Admitting: Podiatry

## 2022-11-27 DIAGNOSIS — L97511 Non-pressure chronic ulcer of other part of right foot limited to breakdown of skin: Secondary | ICD-10-CM

## 2022-11-27 DIAGNOSIS — L97301 Non-pressure chronic ulcer of unspecified ankle limited to breakdown of skin: Secondary | ICD-10-CM

## 2022-11-27 DIAGNOSIS — L03031 Cellulitis of right toe: Secondary | ICD-10-CM

## 2022-11-27 DIAGNOSIS — M2041 Other hammer toe(s) (acquired), right foot: Secondary | ICD-10-CM

## 2022-11-27 MED ORDER — SULFAMETHOXAZOLE-TRIMETHOPRIM 800-160 MG PO TABS: 1.0000 | ORAL_TABLET | Freq: Two times a day (BID) | ORAL | 0 refills | Status: DC

## 2022-11-27 NOTE — Progress Notes (Signed)
Subjective: Chief Complaint  Patient presents with   Ulcer    Right foot, some drainage, clear, 2nd digit    52 year old female presents with the above complaints.  She states that the second toe is doing better.  On Wednesday she started walking up dnad down the steps.  At first she felt to be stinging sensation.  There is a mention of some redness and swelling of the total bit of drainage.  After that no further drainage but Friday morning during her normal routine she noticed the skin started to slough off the top of the tip of the toe.  She had Bactrim at home and she started using this.  Currently denies any fevers or chills.   Objective: AAO x3, NAD DP/PT pulses palpable bilaterally, CRT less than 3 seconds On the right foot there is still granular wound present the distal aspect of the toe.  Hyperkeratotic lesion the distal aspect of the toe.  There is edema erythema present to the toe but is no drainage or pus.  There is no fluctuance or crepitation but there is no malodor.  No probing to bone or tunneling. No pain with calf compression, swelling, warmth, erythema   Assessment: Right second toe cellulitis; hammertoe right foot  Plan: -All treatment options discussed with the patient including all alternatives, risks, complications.  -X-rays were obtained reviewed.  3 views of the foot were obtained.  Hammertoes are present.  No definitive cortical destruction to suggest osteomyelitis today.  There is no soft tissue edema.  Edema is present to the distal portion of the toe. -Medically necessary wound debridement was performed today.  Today I sharply debrided the wound utilizing a 312 with scalpel to remove nonviable devitalized tissue to promote wound healing.  Unable to measure the wound prior to debridement post wound measurements are noted above.  There is normal blood loss and hemostasis achieved through manual compression.  Clean with saline.  Medihoney was applied followed by  dressing.  Continue daily dressing changes, offloading at all times. -Continue Bactrim for the cellulitis.  Monitor closely signs or symptoms of worsening infection. -Discussed possible hammertoe repair in the future if needed. -Monitor for any clinical signs or symptoms of infection and directed to call the office immediately should any occur or go to the ER.  RTC 10 to 10 days or sooner if needed  Trula Slade DPM

## 2022-12-01 ENCOUNTER — Ambulatory Visit: Payer: No Typology Code available for payment source | Admitting: Podiatry

## 2022-12-08 ENCOUNTER — Encounter: Payer: Self-pay | Admitting: Podiatry

## 2022-12-08 ENCOUNTER — Ambulatory Visit (INDEPENDENT_AMBULATORY_CARE_PROVIDER_SITE_OTHER): Payer: No Typology Code available for payment source | Admitting: Podiatry

## 2022-12-08 DIAGNOSIS — M2041 Other hammer toe(s) (acquired), right foot: Secondary | ICD-10-CM

## 2022-12-08 DIAGNOSIS — L97511 Non-pressure chronic ulcer of other part of right foot limited to breakdown of skin: Secondary | ICD-10-CM | POA: Diagnosis not present

## 2022-12-10 NOTE — Progress Notes (Signed)
Subjective: Chief Complaint  Patient presents with   Foot Ulcer    Right foot     52 year old female presents with the above complaints.  She states that the wound is doing better but still present.  She does not report any drainage or pus.  Still some swelling redness to the tip of the toe.  No red streaks.  She does not report any fevers or chills.  She has no other concerns today.    Objective: AAO x3, NAD DP/PT pulses palpable bilaterally, CRT less than 3 seconds On the right foot there is still a granular wound present the distal aspect of the toe. There is edema and erythema present to the toe but is no drainage or pus.  There is no increased temperature noted.  There is no fluctuance or crepitation but there is no malodor.  No probing to bone or tunneling.  Hammertoe present No pain with calf compression, swelling, warmth, erythema   Assessment: Right second toe cellulitis; hammertoe right foot  Plan: -All treatment options discussed with the patient including all alternatives, risks, complications.  -The screening appears to be healthy.  There is no purulence but there is still some residual edema and erythema.  She is still at risk of amputation given the ongoing wound I think a lot of this coming from her hammertoe.  Discussed flexor tenotomy at the site decrease pressure and after discussion regards to risks she wishes to proceed.  Consent was signed.  I cleaned the skin with alcohol and mixture of lidocaine, Mark a total of 3 mL was infiltrated in a digital block fashion.  Once anesthetized cleaned with Betadine, alcohol.  I then utilized an 18-gauge needle introduced to the plantar aspect the toe in order to cut the flexor tendon.  The toe was more rectus position although since its semirigid still contracted but this is better.  But this will decrease the pressure to facilitate healing. -Monitor for any clinical signs or symptoms of infection and directed to call the office  immediately should any occur or go to the ER.  Return in about 2 weeks (around 12/22/2022).  Trula Slade DPM

## 2022-12-22 ENCOUNTER — Ambulatory Visit (INDEPENDENT_AMBULATORY_CARE_PROVIDER_SITE_OTHER): Payer: No Typology Code available for payment source | Admitting: Podiatry

## 2022-12-22 ENCOUNTER — Encounter: Payer: Self-pay | Admitting: Student

## 2022-12-22 DIAGNOSIS — L97511 Non-pressure chronic ulcer of other part of right foot limited to breakdown of skin: Secondary | ICD-10-CM

## 2022-12-24 NOTE — Progress Notes (Signed)
Subjective: Chief Complaint  Patient presents with   Diabetic Ulcer     Week follow up right second  toe - looks much better     52 year old female presents with the above complaints.  She said the toe is doing better.  She has not seen any drainage or pus.  No increased swelling or redness.  She does not report any fevers or chills.  No other concerns.    Objective: AAO x3, NAD DP/PT pulses palpable bilaterally, CRT less than 3 seconds On the right foot there is still a small superficial granular wound but doing much better.  Some localized edema still present but there is no ascending cellulitis.  There is no fluctuance or crepitation.  There is no malodor.  Hammertoes are present.  No other open lesions.  Procedure site appears to have healed from where she had the flexor tenotomy. No pain with calf compression, swelling, warmth, erythema  Assessment: Right second toe cellulitis; hammertoe right foot  Plan: -All treatment options discussed with the patient including all alternatives, risks, complications.  -Debrided hyperkeratotic tissue at distal aspect of the talotibial 1 small superficial granular wound..  Doing better.  Continue with daily dressing changes, offloading.  Monitor closely for any signs or symptoms of infection.  Vivi Barrack DPM

## 2023-01-12 ENCOUNTER — Encounter: Payer: Self-pay | Admitting: Podiatry

## 2023-01-12 ENCOUNTER — Ambulatory Visit: Payer: No Typology Code available for payment source | Admitting: Podiatry

## 2023-01-12 DIAGNOSIS — L97511 Non-pressure chronic ulcer of other part of right foot limited to breakdown of skin: Secondary | ICD-10-CM | POA: Diagnosis not present

## 2023-01-12 DIAGNOSIS — M2041 Other hammer toe(s) (acquired), right foot: Secondary | ICD-10-CM

## 2023-01-12 NOTE — Progress Notes (Unsigned)
Subjective: Chief Complaint  Patient presents with   Foot Ulcer    Follow up ulcer 2nd toe right   "Its looking good"    52 year old female presents with the above complaints.  She said the toe is doing better.  She does not see any drainage or pus.  She does have the nail trimmed as clean long as she does not want to do it herself as she will be bleeding a lot at times when she tries to trim her nails.  No fever or chills.  Objective: AAO x3, NAD DP/PT pulses palpable bilaterally, CRT less than 3 seconds Preulcerative hyperkeratotic lesion noted distal aspect the right second toe with some dried blood but upon debridement there is superficial area of skin breakdown which appears to be almost healed at this time.  There is no probing, no tunneling.  Localized edema.  There is no cellulitis noted at this time.  Nails hypertrophic, dystrophic and elongated. No pain with calf compression, swelling, warmth, erythema  Assessment: Right second toe cellulitis; hammertoe right foot-improving  Plan: -All treatment options discussed with the patient including all alternatives, risks, complications.  -Debrided hyperkeratotic tissue at distal aspect of the toe and only very small superficial wound still remaining almost healed.  Monitoring signs or symptoms of infection.  Continue offloading.   -Sharply debriding the nails occurs with any complications including  -Monitor for any clinical signs or symptoms of infection and directed to call the office immediately should any occur or go to the ER.  Return in about 3 weeks (around 02/02/2023) for toe ulcer right 2nd.  Vivi Barrack DPM

## 2023-02-02 ENCOUNTER — Ambulatory Visit (INDEPENDENT_AMBULATORY_CARE_PROVIDER_SITE_OTHER): Payer: No Typology Code available for payment source | Admitting: Podiatry

## 2023-02-02 ENCOUNTER — Encounter: Payer: Self-pay | Admitting: Podiatry

## 2023-02-02 DIAGNOSIS — M2041 Other hammer toe(s) (acquired), right foot: Secondary | ICD-10-CM

## 2023-02-02 DIAGNOSIS — L97511 Non-pressure chronic ulcer of other part of right foot limited to breakdown of skin: Secondary | ICD-10-CM

## 2023-02-04 NOTE — Progress Notes (Signed)
Subjective: Chief Complaint  Patient presents with   Foot Ulcer    Follow up ulcer 2nd toe right   "Its looking pretty good"    52 year old female presents with the above complaints.  Overall she states is doing better.  Some cough still remains but she does not report any drainage or pus or any increase in swelling or redness.  No fevers or chills.  No other concerns.    Objective: AAO x3, NAD DP/PT pulses palpable bilaterally, CRT less than 3 seconds Of the distal aspect of the right second toe with mild hyperkeratotic tissue.  Upon debridement the area is preulcerative but there is no definitive skin breakdown.  No edema, erythema.  There is no tenderness although she does have neuropathy.  No open lesions noted today. No pain with calf compression, swelling, warmth, erythema  Assessment: Right second toe cellulitis; hammertoe right foot-improving  Plan: -All treatment options discussed with the patient including all alternatives, risks, complications.  -Debrided the hyperkeratotic tissue and complications of bleeding today.  Appears the wound is healed but is still preulcerative.  I continue with daily dressing changes and offloading.  Monitor for any signs or symptoms of infection or reoccurrence of the ulceration.  Return in about 2 months (around 04/04/2023) for diabetic foot exam.  Vivi Barrack DPM

## 2023-04-06 ENCOUNTER — Ambulatory Visit (INDEPENDENT_AMBULATORY_CARE_PROVIDER_SITE_OTHER): Payer: No Typology Code available for payment source | Admitting: Podiatry

## 2023-04-06 DIAGNOSIS — M2041 Other hammer toe(s) (acquired), right foot: Secondary | ICD-10-CM

## 2023-04-06 NOTE — Patient Instructions (Signed)

## 2023-04-08 NOTE — Progress Notes (Signed)
Subjective: Chief Complaint  Patient presents with   Routine Post Op    2 month follow up    52 year old female presents with the above complaints.  She states she was doing well she not meet any open lesions or pain or any swelling or redness.  No concerns.   Objective: AAO x3, NAD DP/PT pulses palpable bilaterally, CRT less than 3 seconds Hammertoes are present.  The distal aspect of right second toe there is no significant hyperkeratotic tissue there is no ulceration today.  The nails mildly elongated and hypertrophic presents with return of open lesions otherwise noted.  No pain with calf compression, swelling, warmth, erythema  Assessment: Right second toe cellulitis; hammertoe right foot-improving  Plan: -All treatment options discussed with the patient including all alternatives, risks, complications.  -Overall doing much better.  As a courtesy I debrided the nail without any complications or bleeding.  Continue offloading pads.  Discussed the modification to avoid excess pressure.  Daily for inspection. -Monitor for any clinical signs or symptoms of infection and directed to call the office immediately should any occur or go to the ER.  Return in about 3 months (around 07/07/2023).  Vivi Barrack DPM

## 2023-06-12 ENCOUNTER — Other Ambulatory Visit: Payer: Self-pay | Admitting: Internal Medicine

## 2023-06-12 DIAGNOSIS — Z1231 Encounter for screening mammogram for malignant neoplasm of breast: Secondary | ICD-10-CM

## 2023-06-28 ENCOUNTER — Ambulatory Visit: Payer: Medicaid Other

## 2023-07-06 ENCOUNTER — Ambulatory Visit (INDEPENDENT_AMBULATORY_CARE_PROVIDER_SITE_OTHER): Payer: No Typology Code available for payment source | Admitting: Podiatry

## 2023-07-06 DIAGNOSIS — E1149 Type 2 diabetes mellitus with other diabetic neurological complication: Secondary | ICD-10-CM

## 2023-07-06 DIAGNOSIS — M79674 Pain in right toe(s): Secondary | ICD-10-CM

## 2023-07-06 DIAGNOSIS — M79675 Pain in left toe(s): Secondary | ICD-10-CM | POA: Diagnosis not present

## 2023-07-06 DIAGNOSIS — B351 Tinea unguium: Secondary | ICD-10-CM

## 2023-07-06 NOTE — Patient Instructions (Signed)

## 2023-07-06 NOTE — Progress Notes (Signed)
Subjective: 52 year old female presents to the office for diabetic foot exam.  States she cannot states that she is doing well.  No open lesions.  Her feet are dry.  No other concerns.   Objective: AAO x3, NAD DP/PT pulses palpable bilaterally, CRT less than 3 seconds Sensation decreased Nails are hypertrophic, dystrophic, brittle, discolored, elongated 10. No surrounding redness or drainage. Tenderness nails 2-5 on the right and 1-5 on the left. No open lesions or pre-ulcerative lesions are identified today.  Right hallux amputation  Dry skin present without any skin fissures, open lesions No pain with calf compression, swelling, warmth, erythema  Assessment: 52 year old female with symptomatic onychomycosis, diabetic foot exam  Plan: -All treatment options discussed with the patient including all alternatives, risks, complications.  -We also debrided her leg without any complications or bleeding pneumatic right -Moisturizer daily -Daily foot inspection, glucose control. -Patient encouraged to call the office with any questions, concerns, change in symptoms.   Vivi Barrack DPM

## 2023-07-11 ENCOUNTER — Other Ambulatory Visit: Payer: Self-pay

## 2023-07-11 MED ORDER — SULFAMETHOXAZOLE-TRIMETHOPRIM 800-160 MG PO TABS
1.0000 | ORAL_TABLET | Freq: Two times a day (BID) | ORAL | 0 refills | Status: DC
  Filled 2023-07-11: qty 20, 10d supply, fill #0

## 2023-07-12 ENCOUNTER — Other Ambulatory Visit: Payer: Self-pay

## 2023-07-12 ENCOUNTER — Encounter (HOSPITAL_BASED_OUTPATIENT_CLINIC_OR_DEPARTMENT_OTHER): Payer: Self-pay | Admitting: *Deleted

## 2023-07-12 ENCOUNTER — Emergency Department (HOSPITAL_BASED_OUTPATIENT_CLINIC_OR_DEPARTMENT_OTHER): Payer: No Typology Code available for payment source

## 2023-07-12 ENCOUNTER — Inpatient Hospital Stay (HOSPITAL_BASED_OUTPATIENT_CLINIC_OR_DEPARTMENT_OTHER)
Admission: EM | Admit: 2023-07-12 | Discharge: 2023-07-14 | DRG: 603 | Disposition: A | Discharge status: Home / Self Care | Payer: No Typology Code available for payment source | Attending: Student | Admitting: Student

## 2023-07-12 DIAGNOSIS — B9562 Methicillin resistant Staphylococcus aureus infection as the cause of diseases classified elsewhere: Secondary | ICD-10-CM | POA: Diagnosis present

## 2023-07-12 DIAGNOSIS — R000 Tachycardia, unspecified: Secondary | ICD-10-CM | POA: Diagnosis present

## 2023-07-12 DIAGNOSIS — L299 Pruritus, unspecified: Secondary | ICD-10-CM | POA: Diagnosis present

## 2023-07-12 DIAGNOSIS — E785 Hyperlipidemia, unspecified: Secondary | ICD-10-CM | POA: Diagnosis present

## 2023-07-12 DIAGNOSIS — Z91199 Patient's noncompliance with other medical treatment and regimen due to unspecified reason: Secondary | ICD-10-CM

## 2023-07-12 DIAGNOSIS — L03213 Periorbital cellulitis: Secondary | ICD-10-CM | POA: Diagnosis present

## 2023-07-12 DIAGNOSIS — K219 Gastro-esophageal reflux disease without esophagitis: Secondary | ICD-10-CM | POA: Diagnosis present

## 2023-07-12 DIAGNOSIS — E1165 Type 2 diabetes mellitus with hyperglycemia: Secondary | ICD-10-CM | POA: Diagnosis present

## 2023-07-12 DIAGNOSIS — Z79899 Other long term (current) drug therapy: Secondary | ICD-10-CM

## 2023-07-12 DIAGNOSIS — L4050 Arthropathic psoriasis, unspecified: Secondary | ICD-10-CM | POA: Diagnosis present

## 2023-07-12 DIAGNOSIS — E119 Type 2 diabetes mellitus without complications: Secondary | ICD-10-CM

## 2023-07-12 DIAGNOSIS — Z89421 Acquired absence of other right toe(s): Secondary | ICD-10-CM

## 2023-07-12 DIAGNOSIS — L0201 Cutaneous abscess of face: Secondary | ICD-10-CM | POA: Diagnosis present

## 2023-07-12 DIAGNOSIS — Z833 Family history of diabetes mellitus: Secondary | ICD-10-CM

## 2023-07-12 DIAGNOSIS — L03211 Cellulitis of face: Secondary | ICD-10-CM | POA: Diagnosis not present

## 2023-07-12 DIAGNOSIS — Z791 Long term (current) use of non-steroidal anti-inflammatories (NSAID): Secondary | ICD-10-CM

## 2023-07-12 DIAGNOSIS — Z825 Family history of asthma and other chronic lower respiratory diseases: Secondary | ICD-10-CM

## 2023-07-12 DIAGNOSIS — Z794 Long term (current) use of insulin: Secondary | ICD-10-CM

## 2023-07-12 DIAGNOSIS — Z881 Allergy status to other antibiotic agents status: Secondary | ICD-10-CM

## 2023-07-12 DIAGNOSIS — F39 Unspecified mood [affective] disorder: Secondary | ICD-10-CM | POA: Diagnosis present

## 2023-07-12 DIAGNOSIS — Z91040 Latex allergy status: Secondary | ICD-10-CM

## 2023-07-12 DIAGNOSIS — I480 Paroxysmal atrial fibrillation: Secondary | ICD-10-CM | POA: Diagnosis present

## 2023-07-12 DIAGNOSIS — L409 Psoriasis, unspecified: Secondary | ICD-10-CM | POA: Diagnosis present

## 2023-07-12 DIAGNOSIS — T383X6A Underdosing of insulin and oral hypoglycemic [antidiabetic] drugs, initial encounter: Secondary | ICD-10-CM | POA: Diagnosis present

## 2023-07-12 DIAGNOSIS — Z6837 Body mass index (BMI) 37.0-37.9, adult: Secondary | ICD-10-CM

## 2023-07-12 DIAGNOSIS — I10 Essential (primary) hypertension: Secondary | ICD-10-CM | POA: Diagnosis present

## 2023-07-12 DIAGNOSIS — Z8249 Family history of ischemic heart disease and other diseases of the circulatory system: Secondary | ICD-10-CM

## 2023-07-12 DIAGNOSIS — R21 Rash and other nonspecific skin eruption: Secondary | ICD-10-CM | POA: Diagnosis present

## 2023-07-12 DIAGNOSIS — Y92009 Unspecified place in unspecified non-institutional (private) residence as the place of occurrence of the external cause: Secondary | ICD-10-CM

## 2023-07-12 DIAGNOSIS — Z91148 Patient's other noncompliance with medication regimen for other reason: Secondary | ICD-10-CM

## 2023-07-12 DIAGNOSIS — Z888 Allergy status to other drugs, medicaments and biological substances status: Secondary | ICD-10-CM

## 2023-07-12 DIAGNOSIS — Z6836 Body mass index (BMI) 36.0-36.9, adult: Secondary | ICD-10-CM

## 2023-07-12 DIAGNOSIS — E88819 Insulin resistance, unspecified: Secondary | ICD-10-CM | POA: Diagnosis present

## 2023-07-12 DIAGNOSIS — E282 Polycystic ovarian syndrome: Secondary | ICD-10-CM | POA: Diagnosis present

## 2023-07-12 DIAGNOSIS — E66812 Obesity, class 2: Secondary | ICD-10-CM | POA: Diagnosis present

## 2023-07-12 LAB — CBC WITH DIFFERENTIAL/PLATELET
Abs Immature Granulocytes: 0.03 10*3/uL (ref 0.00–0.07)
Basophils Absolute: 0 10*3/uL (ref 0.0–0.1)
Basophils Relative: 0 %
Eosinophils Absolute: 0.1 10*3/uL (ref 0.0–0.5)
Eosinophils Relative: 1 %
HCT: 43.8 % (ref 36.0–46.0)
Hemoglobin: 14.8 g/dL (ref 12.0–15.0)
Immature Granulocytes: 0 %
Lymphocytes Relative: 12 %
Lymphs Abs: 0.9 10*3/uL (ref 0.7–4.0)
MCH: 29.9 pg (ref 26.0–34.0)
MCHC: 33.8 g/dL (ref 30.0–36.0)
MCV: 88.5 fL (ref 80.0–100.0)
Monocytes Absolute: 0.5 10*3/uL (ref 0.1–1.0)
Monocytes Relative: 7 %
Neutro Abs: 6.1 10*3/uL (ref 1.7–7.7)
Neutrophils Relative %: 80 %
Platelets: 254 10*3/uL (ref 150–400)
RBC: 4.95 MIL/uL (ref 3.87–5.11)
RDW: 13.6 % (ref 11.5–15.5)
WBC: 7.7 10*3/uL (ref 4.0–10.5)
nRBC: 0 % (ref 0.0–0.2)

## 2023-07-12 LAB — COMPREHENSIVE METABOLIC PANEL
ALT: 10 U/L (ref 0–44)
AST: 11 U/L — ABNORMAL LOW (ref 15–41)
Albumin: 3.9 g/dL (ref 3.5–5.0)
Alkaline Phosphatase: 92 U/L (ref 38–126)
Anion gap: 7 (ref 5–15)
BUN: 8 mg/dL (ref 6–20)
CO2: 26 mmol/L (ref 22–32)
Calcium: 9 mg/dL (ref 8.9–10.3)
Chloride: 97 mmol/L — ABNORMAL LOW (ref 98–111)
Creatinine, Ser: 0.58 mg/dL (ref 0.44–1.00)
GFR, Estimated: >60 mL/min (ref >60)
Glucose, Bld: 397 mg/dL — ABNORMAL HIGH (ref 70–99)
Potassium: 3.8 mmol/L (ref 3.5–5.1)
Sodium: 130 mmol/L — ABNORMAL LOW (ref 135–145)
Total Bilirubin: 0.7 mg/dL (ref 0.3–1.2)
Total Protein: 8.2 g/dL — ABNORMAL HIGH (ref 6.5–8.1)

## 2023-07-12 MED ORDER — DIPHENHYDRAMINE HCL 50 MG/ML IJ SOLN
12.5000 mg | Freq: Once | INTRAMUSCULAR | Status: AC
  Administered 2023-07-12: 12.5 mg via INTRAVENOUS
  Filled 2023-07-12: qty 1

## 2023-07-12 MED ORDER — SODIUM CHLORIDE 0.9 % IV SOLN
2.0000 g | Freq: Once | INTRAVENOUS | Status: AC
  Administered 2023-07-12: 2 g via INTRAVENOUS
  Filled 2023-07-12: qty 20

## 2023-07-12 MED ORDER — IOHEXOL 300 MG/ML  SOLN
100.0000 mL | Freq: Once | INTRAMUSCULAR | Status: AC | PRN
  Administered 2023-07-12: 80 mL via INTRAVENOUS

## 2023-07-12 MED ORDER — VANCOMYCIN HCL IN DEXTROSE 1-5 GM/200ML-% IV SOLN
1000.0000 mg | Freq: Once | INTRAVENOUS | Status: AC
  Administered 2023-07-12: 1000 mg via INTRAVENOUS

## 2023-07-12 MED ORDER — ACETAMINOPHEN 325 MG PO TABS
650.0000 mg | ORAL_TABLET | Freq: Once | ORAL | Status: AC
  Administered 2023-07-12: 650 mg via ORAL
  Filled 2023-07-12: qty 2

## 2023-07-12 MED ORDER — ACETAMINOPHEN 500 MG PO TABS
1000.0000 mg | ORAL_TABLET | Freq: Four times a day (QID) | ORAL | Status: DC | PRN
  Administered 2023-07-13 – 2023-07-14 (×2): 1000 mg via ORAL
  Filled 2023-07-12 (×2): qty 2

## 2023-07-12 MED ORDER — IBUPROFEN 200 MG PO TABS
600.0000 mg | ORAL_TABLET | Freq: Four times a day (QID) | ORAL | Status: DC | PRN
  Administered 2023-07-13 (×2): 600 mg via ORAL
  Filled 2023-07-12 (×2): qty 3

## 2023-07-12 MED ORDER — VANCOMYCIN HCL 2000 MG/400ML IV SOLN: 2000.0000 mg | Freq: Once | INTRAVENOUS | Status: DC

## 2023-07-12 MED ORDER — SODIUM CHLORIDE 0.9 % IV BOLUS
1000.0000 mL | Freq: Once | INTRAVENOUS | Status: AC
  Administered 2023-07-12: 1000 mL via INTRAVENOUS

## 2023-07-12 MED ORDER — ENOXAPARIN SODIUM 60 MG/0.6ML IJ SOSY: 55.0000 mg | PREFILLED_SYRINGE | INTRAMUSCULAR | Status: DC

## 2023-07-12 MED ORDER — VANCOMYCIN HCL IN DEXTROSE 1-5 GM/200ML-% IV SOLN
1000.0000 mg | Freq: Once | INTRAVENOUS | Status: AC
  Administered 2023-07-12: 1000 mg via INTRAVENOUS
  Filled 2023-07-12: qty 200

## 2023-07-12 MED ORDER — SODIUM CHLORIDE 0.9 % IV SOLN
2.0000 g | INTRAVENOUS | Status: DC
  Administered 2023-07-13: 2 g via INTRAVENOUS
  Filled 2023-07-12 (×2): qty 20

## 2023-07-12 NOTE — ED Triage Notes (Signed)
Pt has skin infection on left cheek which has been getting worse.  Pt was placed on bactrim yesterday but swelling has increased.  Pt has swelling to left cheek and eye.

## 2023-07-12 NOTE — Progress Notes (Signed)
Plan of Care Note for accepted transfer   Patient: [name] MRN: [id number]   DOA: 07/12/2023  Facility requesting transfer: [facility] Requesting Provider: Jerene Pitch Reason for transfer: Cellulitis Facility course: 52 yo F with several days of facial erythema, swelling.  Failed outpt bactrim.    Cellulitis vs erysipelas (looks more like erysipelas to me on that photo, but can't really see how well demarcated / raised the outer area of erythema is or isnt as I'm looking at photo, not pt in person) of face and PERIorbital area.  No abscess on CT.  T 99.3 and tachy to 110 in ED.  Admitting pt for IV ABx, failed outpt ABx.  Plan of care: The patient is accepted for admission to Telemetry unit, at [facility]..   Author: Hillary Bow., DO 07/12/2023  Check [email or web address] for on-call coverage.  Nursing staff, Please call [facility] Admits & Consults System-Wide number on Amion as soon as patient's arrival, so appropriate admitting provider can evaluate the pt.

## 2023-07-12 NOTE — ED Provider Triage Note (Signed)
Emergency Medicine Provider Triage Evaluation Note  [name] , a 52 y.o. female  was evaluated in triage.  Pt complains of a facial abscess.  Pt started on Bactrim but is getting worse.  Pt reports she now has swelling around her eye.  Pt's Md sent pt here for evalaution and ct scan  Review of Systems  Positive: swelling Negative: fever  Physical Exam  Pulse (!) 113   Temp 99.3 F (37.4 C) (Oral)   Resp 20  Gen:   Awake, no distress   Resp:  Normal effort  MSK:   Moves extremities without difficulty  Other:  Swollen area left cheek, swollen left upper and lower eyelic  Medical Decision Making  Medically screening exam initiated at 5:29 PM.  Appropriate orders placed.  Heylee Greenlief was informed that the remainder of the evaluation will be completed by another provider, this initial triage assessment does not replace that evaluation, and the importance of remaining in the ED until their evaluation is complete.     Elson Areas, [place] 07/12/23 1730

## 2023-07-12 NOTE — ED Notes (Signed)
[facility] at bedside to transport pt to [place] 

## 2023-07-12 NOTE — ED Provider Notes (Signed)
[place] EMERGENCY DEPARTMENT AT [facility] Provider Note   CSN: [id number] Arrival date & time: 07/12/23  1651     History  Chief Complaint  Patient presents with   Cellulitis    [name] is a 52 y.o. female history of diabetes scented with aphasia rash for the past 4 days that is spread from her left cheek to her left eye and now swelling around her eye.  Patient denies pain with extraocular movements, fevers, vision changes.  Patient was placed on Bactrim few days ago and has taken 3 doses of the patient however states that the rash is gotten worse.  Patient is concerned that abscess is formed.  Patient denies any trouble swallowing or oral symptoms.  Patient denies any neck swelling, trouble breathing, chest pain, headaches, vision changes.  Patient denied any recent illnesses.   Home Medications Prior to Admission medications   Medication Sig Start Date End Date Taking? Authorizing Provider  acetaminophen (TYLENOL) 500 MG tablet Take 1,000 mg by mouth every 6 (six) hours as needed for mild pain or headache.    [provider]  albuterol (VENTOLIN HFA) 108 (90 Base) MCG/ACT inhaler INHALE 2 PUFFS PO Q 6 H PRN 12/03/18   [provider]  amoxicillin-clavulanate (AUGMENTIN) 875-125 MG tablet Take 1 tablet by mouth 2 (two) times daily. 04/15/21   Vivi Barrack, DPM  Cholecalciferol (VITAMIN D-3) 125 MCG (5000 UT) TABS 1 capsule    [provider]  Cholecalciferol (VITAMIN D3) 5000 units CAPS Take 5,000 Units by mouth daily.    [provider]  clotrimazole (MYCELEX) 10 MG troche Dissolve 1 troche (tablet) (10 mg total) by mouth 4 to 5 (five) times daily until gone. 06/07/22     Dapagliflozin-metFORMIN HCl ER (XIGDUO XR) 01-999 MG TB24 Take 2 tablets by mouth daily.    [provider]  escitalopram (LEXAPRO) 10 MG tablet Take by mouth. 04/13/21   [provider]  estradiol (ESTRACE) 0.1 MG/GM vaginal cream  INSERT 0.5G VAGINAL TWICE DAILY FOR 7 DAYS. FOLLOWED BY TWICE WEEKLY FOR 30 10/28/20   [provider]  FEROSUL 325 (65 Fe) MG tablet Take 650 mg by mouth daily with breakfast.  12/04/17   [provider]  fluconazole (DIFLUCAN) 150 MG tablet Take 150 mg by mouth once a week. 12/19/19   [provider]  fluconazole (DIFLUCAN) 150 MG tablet 1 tablet    [provider]  HUMALOG MIX 50/50 KWIKPEN (50-50) 100 UNIT/ML Kwikpen Inject into the skin. 03/27/20   [provider]  HUMULIN R U-500 KWIKPEN 500 UNIT/ML KwikPen Inject into the skin.    [provider]  HYDROcodone-acetaminophen (NORCO/VICODIN) 5-325 MG tablet Take 1 tablet by mouth every 6 (six) hours as needed. 10/31/21   Vivi Barrack, DPM  ibuprofen (ADVIL,MOTRIN) 200 MG tablet Take 800 mg by mouth daily as needed (pain).    [provider]  insulin glargine (LANTUS) 100 unit/mL SOPN Inject 160 Units into the skin at bedtime.    [provider]  Insulin Lispro Prot & Lispro (HUMALOG 50/50 MIX) (50-50) 100 UNIT/ML Kwikpen Inject into the skin.    [provider]  LUCENTIS 0.3 MG/0.05ML SOSY  12/31/19   [provider]  Multiple Vitamin (MULTIVITAMIN WITH MINERALS) TABS tablet Take 1 tablet by mouth daily. 05/25/18   Osvaldo Shipper, MD  mupirocin ointment (BACTROBAN) 2 % Apply 1 application topically 2 (two) times daily. 09/13/18   Vivi Barrack, DPM  nitrofurantoin, macrocrystal-monohydrate, (MACROBID) 100 MG capsule Take 100 mg by mouth 2 (two) times daily. for 7 days 10/11/20   [provider]  norethindrone (AYGESTIN) 5 MG tablet TK 1 T PO QD 11/10/18   [provider]  norethindrone (AYGESTIN) 5 MG tablet Take by mouth.    [provider]  norethindrone (MICRONOR,CAMILA,ERRIN) 0.35 MG tablet Take 1 tablet by mouth daily.  02/13/18   [provider]  nystatin-triamcinolone (MYCOLOG II) cream Apply 1 application  topically daily as needed (rash/irritation).  10/01/18   [provider]  omeprazole (PRILOSEC) 20 MG capsule Take by mouth.    [provider]  omeprazole (PRILOSEC) 20 MG capsule Take 20 mg by mouth every morning. 02/26/20   [provider]  omeprazole (PRILOSEC) 20 MG capsule 1 capsule 30 minutes before morning meal    [provider]  rosuvastatin (CRESTOR) 5 MG tablet Take 5 mg by mouth 3 (three) times a week. 09/14/18   [provider]  Semaglutide,0.25 or 0.5MG /DOS, (OZEMPIC, 0.25 OR 0.5 MG/DOSE,) 2 MG/1.5ML SOPN Inject 0.5 mg into the skin every Tuesday.    [provider]  silver sulfADIAZINE (SILVADENE) 1 % cream Apply 1 application topically daily. Patient taking differently: Apply 1 application  topically daily as needed (for irritation). 09/07/17   Vivi Barrack, DPM  silver sulfADIAZINE (SILVADENE) 1 % cream Apply 1 Application topically daily. 09/07/22   Vivi Barrack, DPM  sulfamethoxazole-trimethoprim (BACTRIM DS) 800-160 MG tablet Take 1 tablet by mouth 2 (two) times daily. 11/27/22   Vivi Barrack, DPM  sulfamethoxazole-trimethoprim (BACTRIM DS) 800-160 MG tablet Take 1 tablet by mouth 2 (two) times daily for 10 days. 07/11/23     SYNJARDY XR 25-1000 MG TB24 Take 1 tablet by mouth every morning. 02/02/20   [provider]  TALTZ 80 MG/ML SOAJ Inject into the skin. 03/10/20   [provider]  traMADol (ULTRAM) 50 MG tablet Take 50 mg by mouth every 6 (six) hours as needed. 01/16/20   [provider]  traMADol (ULTRAM) 50 MG tablet 1 tablet as needed    [provider]  verapamil (CALAN-SR) 240 MG CR tablet TAKE 1 TABLET BY MOUTH AT BEDTIME Patient taking differently: Take 240 mg by mouth daily. 11/25/15   Corky Crafts, MD      Allergies    Cephalosporins, Apremilast, Pneumococcal vac polyvalent, Synjardy [empagliflozin-metformin hcl], Trulicity [dulaglutide], Ciprofloxacin,  Keflex [cephalexin], and Latex    Review of Systems   Review of Systems  Physical Exam Updated Vital Signs BP (!) 166/88   Pulse (!) 109   Temp 99.3 F (37.4 C) (Oral)   Resp 17   SpO2 96%  Physical Exam Constitutional:      General: She is not in acute distress. HENT:     Head:     Comments: Left cheek: Erythematous and warm however no areas of fluctuance or induration noted, no signs of purulent material    Mouth/Throat:     Mouth: Mucous membranes are moist.     Pharynx: No posterior oropharyngeal erythema.     Comments: No signs of abscess or purulent material Tolerance secretions No muffled voice Eyes:     Extraocular Movements: Extraocular movements intact.     Conjunctiva/sclera: Conjunctivae normal.     Pupils: Pupils are equal, round, and reactive to light.     Comments: Left eye: Edematous with erythema that is warm to the touch however nonfluctuant  Neck:  Comments: No swelling Cardiovascular:     Rate and Rhythm: Regular rhythm. Tachycardia present.     Pulses: Normal pulses.     Heart sounds: Normal heart sounds.  Pulmonary:     Effort: Pulmonary effort is normal. No respiratory distress.     Breath sounds: Normal breath sounds.  Abdominal:     Palpations: Abdomen is soft.     Tenderness: There is no abdominal tenderness. There is no guarding or rebound.  Musculoskeletal:     Cervical back: Normal range of motion.  Skin:    General: Skin is warm and dry.  Neurological:     Mental Status: She is alert and oriented to person, place, and time.     ED Results / Procedures / Treatments   Labs (all labs ordered are listed, but only abnormal results are displayed) Labs Reviewed  COMPREHENSIVE METABOLIC PANEL - Abnormal; Notable for the following components:      Result Value   Sodium 130 (*)    Chloride 97 (*)    Glucose, Bld 397 (*)    Total Protein 8.2 (*)    AST 11 (*)    All other components within normal limits  CBC WITH  DIFFERENTIAL/PLATELET    EKG None  Radiology CT Maxillofacial W Contrast  Result Date: 07/12/2023 CLINICAL DATA:  Skin infection on left cheek, swelling to left cheek and eye EXAM: CT MAXILLOFACIAL WITH CONTRAST TECHNIQUE: Multidetector CT imaging of the maxillofacial structures was performed with intravenous contrast. Multiplanar CT image reconstructions were also generated. RADIATION DOSE REDUCTION: This exam was performed according to the departmental dose-optimization program which includes automated exposure control, adjustment of the mA and/or kV according to patient size and/or use of iterative reconstruction technique. CONTRAST:  80mL OMNIPAQUE IOHEXOL 300 MG/ML  SOLN COMPARISON:  None Available. FINDINGS: Osseous: No fracture or mandibular dislocation. No destructive process. Orbits: Negative. No traumatic or inflammatory finding. Sinuses: Minimal mucosal thickening in the left maxillary sinus. Otherwise clear paranasal sinuses. The mastoids are well aerated. Soft tissues: Hyperenhancement in the medial left cheek (series 2, image 70 and series 7, image 75), with surrounding fat stranding and skin thickening that extends to the left periorbital and premalar soft tissues. No focal low-density collection to suggest abscess or phlegmon. Limited intracranial: No significant or unexpected finding. IMPRESSION: Cellulitis in the medial left cheek that extends to the left periorbital and premalar soft tissues. No focal low-density collection to suggest abscess or phlegmon. Electronically Signed   By: Wiliam Ke M.D.   On: 07/12/2023 19:30    Procedures Procedures    Medications Ordered in ED Medications  vancomycin (VANCOCIN) IVPB 1000 mg/200 mL premix (has no administration in time range)    And  vancomycin (VANCOCIN) IVPB 1000 mg/200 mL premix (has no administration in time range)  iohexol (OMNIPAQUE) 300 MG/ML solution 100 mL (80 mLs Intravenous Contrast Given 07/12/23 1844)   diphenhydrAMINE (BENADRYL) injection 12.5 mg (12.5 mg Intravenous Given 07/12/23 1930)  sodium chloride 0.9 % bolus 1,000 mL (1,000 mLs Intravenous New Bag/Given 07/12/23 1932)  acetaminophen (TYLENOL) tablet 650 mg (650 mg Oral Given 07/12/23 1929)  cefTRIAXone (ROCEPHIN) 2 g in sodium chloride 0.9 % 100 mL IVPB (2 g Intravenous New Bag/Given 07/12/23 2032)    ED Course/ Medical Decision Making/ A&P                                 Medical Decision Making  Risk OTC drugs. Prescription drug management.   [name] 52 y.o. presented today for left cheek/eye rash. Working DDx that I considered at this time includes, but not limited to, cellulitis, periorbital/orbital cellulitis, sepsis, necrotizing cellulitis, oropharynx abscess/rash, airway compromise.  R/o DDx: orbital cellulitis, sepsis, necrotizing cellulitis, oropharynx abscess/rash, airway compromise: These are considered less likely due to history of present illness, physical exam, labs/imaging findings  Review of prior external notes: 07/12/2023 unknown GI  Unique Tests and My Interpretation:  CBC: Unremarkable CMP: Hyperglycemia 397 CT maxillofacial with contrast: Periorbital cellulitis along with some malar cellulitis on the left side, no abscess  Social Determinants of Health: none  Discussion with Independent Historian: None  Discussion of Management of Tests:  Alinda Money, Pharmacist ; Julian Reil, MD Hospitalist  Risk: High: hospitalization or escalation of hospital-level care  Risk Stratification Score: None  Plan: On exam patient was in no acute distress with stable vitals.  On exam patient does have what appears to be cellulitis on the left cheek extending up into her left eye however does not have any pain with extraocular movements.  Patient denied any infectious symptoms but has been taking Bactrim to no relief and states that rash has been getting worse.  Labs do show patient is hyperglycemic which is most  likely contributing to the cellulitis.  CT scan does show cellulitis going to the periorbital space along with some Maller space however does not appear to have orbital cellulitis.  I spoke to the patient and we agreed that since patient has failed outpatient therapy that patient would benefit from admission for IV antibiotics.  Will consult the hospitalist.  Patient does have multiple allergies listed in the EMR and so I spoke to the [facility] health pharmacist to after reviewing the chart recommends 2 g of Vanco and 2 g of Rocephin initially.  Will order these.  Awaiting hospitalist callback.  Spoke to the hospitalist and patient was accepted for admission.  This chart was dictated using voice recognition software.  Despite best efforts to proofread,  errors can occur which can change the documentation meaning.         Final Clinical Impression(s) / ED Diagnoses Final diagnoses:  Facial cellulitis  Periorbital cellulitis of left eye  Hyperglycemia due to diabetes mellitus [facility])    Rx / DC Orders ED Discharge Orders     None         Remi Deter 07/12/23 2109    Tegeler, Canary Brim, MD 07/12/23 2326

## 2023-07-13 DIAGNOSIS — K219 Gastro-esophageal reflux disease without esophagitis: Secondary | ICD-10-CM | POA: Diagnosis present

## 2023-07-13 DIAGNOSIS — Y92009 Unspecified place in unspecified non-institutional (private) residence as the place of occurrence of the external cause: Secondary | ICD-10-CM | POA: Diagnosis not present

## 2023-07-13 DIAGNOSIS — Z91199 Patient's noncompliance with other medical treatment and regimen due to unspecified reason: Secondary | ICD-10-CM | POA: Diagnosis not present

## 2023-07-13 DIAGNOSIS — L03213 Periorbital cellulitis: Secondary | ICD-10-CM | POA: Diagnosis present

## 2023-07-13 DIAGNOSIS — Z791 Long term (current) use of non-steroidal anti-inflammatories (NSAID): Secondary | ICD-10-CM | POA: Diagnosis not present

## 2023-07-13 DIAGNOSIS — L4050 Arthropathic psoriasis, unspecified: Secondary | ICD-10-CM | POA: Diagnosis present

## 2023-07-13 DIAGNOSIS — Z794 Long term (current) use of insulin: Secondary | ICD-10-CM | POA: Diagnosis not present

## 2023-07-13 DIAGNOSIS — L03211 Cellulitis of face: Secondary | ICD-10-CM | POA: Diagnosis present

## 2023-07-13 DIAGNOSIS — T383X6A Underdosing of insulin and oral hypoglycemic [antidiabetic] drugs, initial encounter: Secondary | ICD-10-CM | POA: Diagnosis present

## 2023-07-13 DIAGNOSIS — Z79899 Other long term (current) drug therapy: Secondary | ICD-10-CM | POA: Diagnosis not present

## 2023-07-13 DIAGNOSIS — E88819 Insulin resistance, unspecified: Secondary | ICD-10-CM | POA: Diagnosis present

## 2023-07-13 DIAGNOSIS — Z888 Allergy status to other drugs, medicaments and biological substances status: Secondary | ICD-10-CM | POA: Diagnosis not present

## 2023-07-13 DIAGNOSIS — Z91148 Patient's other noncompliance with medication regimen for other reason: Secondary | ICD-10-CM | POA: Diagnosis not present

## 2023-07-13 DIAGNOSIS — E1165 Type 2 diabetes mellitus with hyperglycemia: Secondary | ICD-10-CM | POA: Diagnosis present

## 2023-07-13 DIAGNOSIS — L0201 Cutaneous abscess of face: Secondary | ICD-10-CM | POA: Diagnosis present

## 2023-07-13 DIAGNOSIS — E66812 Obesity, class 2: Secondary | ICD-10-CM | POA: Diagnosis present

## 2023-07-13 DIAGNOSIS — E785 Hyperlipidemia, unspecified: Secondary | ICD-10-CM | POA: Diagnosis present

## 2023-07-13 DIAGNOSIS — E119 Type 2 diabetes mellitus without complications: Secondary | ICD-10-CM | POA: Diagnosis not present

## 2023-07-13 DIAGNOSIS — Z881 Allergy status to other antibiotic agents status: Secondary | ICD-10-CM | POA: Diagnosis not present

## 2023-07-13 DIAGNOSIS — Z91040 Latex allergy status: Secondary | ICD-10-CM | POA: Diagnosis not present

## 2023-07-13 DIAGNOSIS — F39 Unspecified mood [affective] disorder: Secondary | ICD-10-CM | POA: Diagnosis present

## 2023-07-13 DIAGNOSIS — I480 Paroxysmal atrial fibrillation: Secondary | ICD-10-CM | POA: Diagnosis present

## 2023-07-13 DIAGNOSIS — Z6836 Body mass index (BMI) 36.0-36.9, adult: Secondary | ICD-10-CM | POA: Diagnosis not present

## 2023-07-13 DIAGNOSIS — E282 Polycystic ovarian syndrome: Secondary | ICD-10-CM | POA: Diagnosis present

## 2023-07-13 DIAGNOSIS — B9562 Methicillin resistant Staphylococcus aureus infection as the cause of diseases classified elsewhere: Secondary | ICD-10-CM | POA: Diagnosis present

## 2023-07-13 DIAGNOSIS — I10 Essential (primary) hypertension: Secondary | ICD-10-CM | POA: Diagnosis present

## 2023-07-13 LAB — GLUCOSE, CAPILLARY
Glucose-Capillary: 387 mg/dL — ABNORMAL HIGH (ref 70–99)
Glucose-Capillary: 315 mg/dL — ABNORMAL HIGH (ref 70–99)
Glucose-Capillary: 272 mg/dL — ABNORMAL HIGH (ref 70–99)
Glucose-Capillary: 232 mg/dL — ABNORMAL HIGH (ref 70–99)
Glucose-Capillary: 239 mg/dL — ABNORMAL HIGH (ref 70–99)
Glucose-Capillary: 280 mg/dL — ABNORMAL HIGH (ref 70–99)

## 2023-07-13 LAB — CBC
HCT: 43.5 % (ref 36.0–46.0)
Hemoglobin: 14.3 g/dL (ref 12.0–15.0)
MCH: 30 pg (ref 26.0–34.0)
MCHC: 32.9 g/dL (ref 30.0–36.0)
MCV: 91.4 fL (ref 80.0–100.0)
Platelets: 243 10*3/uL (ref 150–400)
RBC: 4.76 MIL/uL (ref 3.87–5.11)
RDW: 13.5 % (ref 11.5–15.5)
WBC: 7.1 10*3/uL (ref 4.0–10.5)
nRBC: 0 % (ref 0.0–0.2)

## 2023-07-13 LAB — PHOSPHORUS: Phosphorus: 3.1 mg/dL (ref 2.5–4.6)

## 2023-07-13 LAB — BASIC METABOLIC PANEL
Anion gap: 10 (ref 5–15)
BUN: 7 mg/dL (ref 6–20)
CO2: 23 mmol/L (ref 22–32)
Calcium: 8.4 mg/dL — ABNORMAL LOW (ref 8.9–10.3)
Chloride: 101 mmol/L (ref 98–111)
Creatinine, Ser: 0.38 mg/dL — ABNORMAL LOW (ref 0.44–1.00)
GFR, Estimated: >60 mL/min (ref >60)
Glucose, Bld: 272 mg/dL — ABNORMAL HIGH (ref 70–99)
Potassium: 3.3 mmol/L — ABNORMAL LOW (ref 3.5–5.1)
Sodium: 134 mmol/L — ABNORMAL LOW (ref 135–145)

## 2023-07-13 LAB — HIV ANTIBODY (ROUTINE TESTING W REFLEX): HIV Screen 4th Generation wRfx: NONREACTIVE

## 2023-07-13 LAB — HEMOGLOBIN A1C
Hgb A1c MFr Bld: 13.5 % — ABNORMAL HIGH (ref 4.8–5.6)
Mean Plasma Glucose: 340.75 mg/dL

## 2023-07-13 LAB — LACTIC ACID, PLASMA: Lactic Acid, Venous: 0.9 mmol/L (ref 0.5–1.9)

## 2023-07-13 LAB — MAGNESIUM: Magnesium: 1.9 mg/dL (ref 1.7–2.4)

## 2023-07-13 MED ORDER — MELOXICAM 15 MG PO TABS
15.0000 mg | ORAL_TABLET | Freq: Every day | ORAL | Status: DC
  Filled 2023-07-13: qty 1

## 2023-07-13 MED ORDER — ALBUTEROL SULFATE (2.5 MG/3ML) 0.083% IN NEBU: 2.5000 mg | INHALATION_SOLUTION | Freq: Four times a day (QID) | RESPIRATORY_TRACT | Status: DC | PRN

## 2023-07-13 MED ORDER — INSULIN NPH (HUMAN) (ISOPHANE) 100 UNIT/ML ~~LOC~~ SUSP
100.0000 [IU] | Freq: Two times a day (BID) | SUBCUTANEOUS | Status: DC
  Administered 2023-07-13 – 2023-07-14 (×3): 100 [IU] via SUBCUTANEOUS
  Filled 2023-07-13: qty 10

## 2023-07-13 MED ORDER — ALBUTEROL SULFATE HFA 108 (90 BASE) MCG/ACT IN AERS: 2.0000 | INHALATION_SPRAY | Freq: Four times a day (QID) | RESPIRATORY_TRACT | Status: DC | PRN

## 2023-07-13 MED ORDER — VERAPAMIL HCL ER 240 MG PO TBCR
240.0000 mg | EXTENDED_RELEASE_TABLET | Freq: Every day | ORAL | Status: DC
Start: 2023-07-13 — End: 2023-07-14
  Administered 2023-07-13 – 2023-07-14 (×2): 240 mg via ORAL
  Filled 2023-07-13 (×2): qty 1

## 2023-07-13 MED ORDER — INSULIN ASPART 100 UNIT/ML IJ SOLN
0.0000 [IU] | Freq: Three times a day (TID) | INTRAMUSCULAR | Status: DC
  Administered 2023-07-13 (×2): 7 [IU] via SUBCUTANEOUS
  Administered 2023-07-13: 11 [IU] via SUBCUTANEOUS
  Administered 2023-07-14 (×2): 7 [IU] via SUBCUTANEOUS

## 2023-07-13 MED ORDER — VANCOMYCIN HCL IN DEXTROSE 1-5 GM/200ML-% IV SOLN
1000.0000 mg | Freq: Two times a day (BID) | INTRAVENOUS | Status: DC
  Administered 2023-07-13 – 2023-07-14 (×3): 1000 mg via INTRAVENOUS
  Filled 2023-07-13 (×3): qty 200

## 2023-07-13 MED ORDER — ROSUVASTATIN CALCIUM 5 MG PO TABS
5.0000 mg | ORAL_TABLET | Freq: Every day | ORAL | Status: DC
  Administered 2023-07-13 – 2023-07-14 (×2): 5 mg via ORAL
  Filled 2023-07-13 (×2): qty 1

## 2023-07-13 MED ORDER — INSULIN ASPART 100 UNIT/ML IJ SOLN
0.0000 [IU] | Freq: Every day | INTRAMUSCULAR | Status: DC
  Administered 2023-07-13: 3 [IU] via SUBCUTANEOUS
  Administered 2023-07-13: 5 [IU] via SUBCUTANEOUS

## 2023-07-13 MED ORDER — ESCITALOPRAM OXALATE 20 MG PO TABS
20.0000 mg | ORAL_TABLET | Freq: Every day | ORAL | Status: DC
  Administered 2023-07-13 – 2023-07-14 (×2): 20 mg via ORAL
  Filled 2023-07-13 (×2): qty 1

## 2023-07-13 MED ORDER — DIPHENHYDRAMINE HCL 25 MG PO CAPS
25.0000 mg | ORAL_CAPSULE | Freq: Four times a day (QID) | ORAL | Status: DC | PRN
  Administered 2023-07-13 – 2023-07-14 (×3): 25 mg via ORAL
  Filled 2023-07-13 (×3): qty 1

## 2023-07-13 MED ORDER — PANTOPRAZOLE SODIUM 40 MG PO TBEC
40.0000 mg | DELAYED_RELEASE_TABLET | Freq: Every day | ORAL | Status: DC
  Administered 2023-07-13 – 2023-07-14 (×2): 40 mg via ORAL
  Filled 2023-07-13 (×2): qty 1

## 2023-07-13 NOTE — Inpatient Diabetes Management (Signed)
Inpatient Diabetes Program Recommendations  AACE/ADA: New Consensus Statement on Inpatient Glycemic Control (2015)  Target Ranges:  Prepandial:   less than 140 mg/dL      Peak postprandial:   less than 180 mg/dL (1-2 hours)      Critically ill patients:  140 - 180 mg/dL   Lab Results  Component Value Date   GLUCAP 232 (H) 07/13/2023   HGBA1C 13.5 (H) 07/13/2023    Review of Glycemic Control  Diabetes history: DM 2 Outpatient Diabetes medications: Humulin R U-500 180 units breakfast, 150 units lunch, 150 units dinner Current orders for Inpatient glycemic control:  NPH 100 units bid Novolog 0-20 units tid + hs  A1c 13.5% this admission  Spoke with pt at bedside regarding A1c level of 13.5% and glucose control at home. Pt is followed by Dr. Sharl Ma, Endocrinologist, and also works in the same office. Pt reports that she needs to be more compliant with taking her medications, checking her glucose levels, and with diet and beverage options at home. Pt reports needing a prescription for concentrated Humulin R U-500 insulin at home and then she will follow up with Dr. Sharl Ma on Monday. Pt reports getting her prescription filled at [facility]. Reviewed glucose and A1c goals.  Pt says she does not have any further needs at this time.   Thanks,  Christena Deem RN, MSN, BC-ADM Inpatient Diabetes Coordinator Team Pager [phone] (8a-5p)

## 2023-07-13 NOTE — Progress Notes (Signed)
Pharmacy Antibiotic Note  [name] is a 52 y.o. female admitted on 07/12/2023 with cellulitis.  Pharmacy has been consulted for Vancomycin dosing.  Plan: Vancomycin 1000 mg IV Q 12 hrs. Goal AUC 400-550.  Expected AUC: 486.1  SCr used: 0.8 (rounded up from 0.58) Ceftriaxone 2g IV q24h per MD Follow renal function  Height: 5\' 8"  (172.7 cm) (estimated) Weight: 112.3 kg (247 lb 9.2 oz) IBW/kg (Calculated) : 63.9  Temp (24hrs), Avg:99.4 F (37.4 C), Min:99.3 F (37.4 C), Max:99.5 F (37.5 C)  Recent Labs  Lab 07/12/23 1728  WBC 7.7  CREATININE 0.58    Estimated Creatinine Clearance: 108.2 mL/min (by C-G formula based on SCr of 0.58 mg/dL).    Allergies  Allergen Reactions   Cephalosporins Hives   Apremilast Diarrhea   Pneumococcal Vac Polyvalent     Other Reaction(s): swelling, itching, and redness at site of intradermal Pneumovax injection   Synjardy [Empagliflozin-Metformin Hcl] Other (See Comments)    Yeast Infection   Trulicity [Dulaglutide] Diarrhea and Nausea And Vomiting   Ciprofloxacin Rash   Keflex [Cephalexin] Rash   Latex Rash    Antimicrobials this admission: 10/24 Ceftriaxone >>   10/24 Vancomycin >>    Dose adjustments this admission:    Microbiology results:    Thank you for allowing pharmacy to be a part of this patient's care.  Maryellen Pile, PharmD 07/13/2023 12:49 AM

## 2023-07-13 NOTE — H&P (Signed)
History and Physical    Etheleen Vandenboom GUR:[id number] DOB: Jun 30, 1971 DOA: 07/12/2023  PCP: Thana Ates, MD   Patient coming from:  Transfer from [facility] ED   Chief Complaint:  Chief Complaint  Patient presents with   Cellulitis    HPI:  Katarena Finer is a 52 y.o. female with hx of DM type 2, obesity, psoriatic arthritis on Ixekizumab, hx SVT, who was transferred from [facility] ED for facial cellulitis with preseptal cellulitis.  She reports onset of focal swelling over the left cheek on Monday.  This progressed over the subsequent days with worsening erythema, induration, and involvement of her eyelid in the past day. Was prescribed Bactrim and had taken 3 doses prior to presenting to ED. She denies significant pain with EOMs currently, no visual loss.  Notably was at [facility] in the weekend prior to onset, also in the hot tub.  Her last fill of insulin appears to be in May 2024, however reports that she has been using her sisters insulin.  Stopped taking insulin completely 1 week prior to her admission.  She works as an MA for an endocrinologist.    Review of Systems:  ROS complete and negative except as marked above   Allergies  Allergen Reactions   Cephalosporins Hives   Apremilast Diarrhea   Pneumococcal Vac Polyvalent     Other Reaction(s): swelling, itching, and redness at site of intradermal Pneumovax injection   Synjardy [Empagliflozin-Metformin Hcl] Other (See Comments)    Yeast Infection   Trulicity [Dulaglutide] Diarrhea and Nausea And Vomiting   Ciprofloxacin Rash   Keflex [Cephalexin] Rash   Latex Rash    Prior to Admission medications   Medication Sig Start Date End Date Taking? Authorizing Provider  acetaminophen (TYLENOL) 500 MG tablet Take 1,000 mg by mouth every 6 (six) hours as needed for mild pain or headache.   Yes [provider]  albuterol (VENTOLIN HFA) 108 (90 Base) MCG/ACT inhaler Inhale 2 puffs into the lungs every 6 (six) hours as  needed for wheezing or shortness of breath. 12/03/18  Yes [provider]  Cholecalciferol (VITAMIN D-3) 125 MCG (5000 UT) TABS Take 1 tablet by mouth daily.   Yes [provider]  Cholecalciferol (VITAMIN D3) 5000 units CAPS Take 5,000 Units by mouth daily.   Yes [provider]  escitalopram (LEXAPRO) 20 MG tablet Take 20 mg by mouth daily.   Yes [provider]  estradiol (ESTRACE) 0.1 MG/GM vaginal cream Place 1 Applicatorful vaginally once a week. 10/28/20  Yes [provider]  fluconazole (DIFLUCAN) 150 MG tablet Take 150 mg by mouth as needed (bacterial infection). 12/19/19  Yes [provider]  HUMULIN R U-500 KWIKPEN 500 UNIT/ML KwikPen Inject 150-180 Units into the skin in the morning, at noon, and at bedtime. Inject 180 units in morning, 150 units at lunch and dinner   Yes [provider]  ibuprofen (ADVIL,MOTRIN) 200 MG tablet Take 800 mg by mouth daily as needed (pain).   Yes [provider]  meloxicam (MOBIC) 15 MG tablet Take 15 mg by mouth daily. 10/12/21  Yes [provider]  norethindrone (AYGESTIN) 5 MG tablet Take 5 mg by mouth daily. 11/10/18  Yes [provider]  nystatin-triamcinolone (MYCOLOG II) cream Apply 1 application topically daily as needed (rash/irritation).  10/01/18  Yes [provider]  omeprazole (PRILOSEC) 20 MG capsule Take 20 mg by mouth every morning. 02/26/20  Yes [provider]  rosuvastatin (CRESTOR) 5  MG tablet Take 5 mg by mouth daily. 09/14/18  Yes [provider]  sulfamethoxazole-trimethoprim (BACTRIM DS) 800-160 MG tablet Take 1 tablet by mouth 2 (two) times daily for 10 days. 07/11/23  Yes   TALTZ 80 MG/ML SOAJ Inject 80 mg into the skin every 28 (twenty-eight) days. 03/10/20  Yes [provider]  verapamil (CALAN-SR) 240 MG CR tablet TAKE 1 TABLET BY MOUTH AT BEDTIME Patient taking differently: Take 240 mg by mouth daily. 11/25/15  Yes  Corky Crafts, MD  amoxicillin-clavulanate (AUGMENTIN) 875-125 MG tablet Take 1 tablet by mouth 2 (two) times daily. Patient not taking: Reported on 07/12/2023 04/15/21   Vivi Barrack, DPM  clotrimazole (MYCELEX) 10 MG troche Dissolve 1 troche (tablet) (10 mg total) by mouth 4 to 5 (five) times daily until gone. Patient not taking: Reported on 07/12/2023 06/07/22     HYDROcodone-acetaminophen (NORCO/VICODIN) 5-325 MG tablet Take 1 tablet by mouth every 6 (six) hours as needed. Patient not taking: Reported on 07/12/2023 10/31/21   Vivi Barrack, DPM  Multiple Vitamin (MULTIVITAMIN WITH MINERALS) TABS tablet Take 1 tablet by mouth daily. Patient not taking: Reported on 07/12/2023 05/25/18   Osvaldo Shipper, MD  mupirocin ointment (BACTROBAN) 2 % Apply 1 application topically 2 (two) times daily. Patient not taking: Reported on 07/12/2023 09/13/18   Vivi Barrack, DPM  silver sulfADIAZINE (SILVADENE) 1 % cream Apply 1 application topically daily. Patient not taking: Reported on 07/12/2023 09/07/17   Vivi Barrack, DPM  silver sulfADIAZINE (SILVADENE) 1 % cream Apply 1 Application topically daily. Patient not taking: Reported on 07/12/2023 09/07/22   Vivi Barrack, DPM  sulfamethoxazole-trimethoprim (BACTRIM DS) 800-160 MG tablet Take 1 tablet by mouth 2 (two) times daily. Patient not taking: Reported on 07/12/2023 11/27/22   Vivi Barrack, DPM    Past Medical History:  Diagnosis Date   Atrial fibrillation [facility])    Diabetic retinopathy (HCC)    Diabetic ulcer of right great toe [facility])    "been dealing w/it for ~ 1 yr" (05/20/2018)   Polycystic ovarian syndrome    Psoriasis    Psoriatic arthritis (HCC)    "pretty much all over; knees, hands, elbows" (05/20/2018)   SVT (supraventricular tachycardia) (HCC)    "had 1 chemical conversion here in the ER" (05/20/2018)   Type II diabetes mellitus (HCC)     Past Surgical History:  Procedure Laterality Date   AMPUTATION  TOE Right 05/22/2018   Procedure: AMPUTATION RIGHT TOE;  Surgeon: Vivi Barrack, DPM;  Location: [facility] OR;  Service: Podiatry;  Laterality: Right;   CESAREAN SECTION  2005   FOOT FRACTURE SURGERY Left    pin put in by Dr. Ardelle Anton   FRACTURE SURGERY     TUBAL LIGATION  2005     reports that she has never smoked. She has never used smokeless tobacco. She reports that she does not currently use alcohol. She reports that she does not use drugs.  Family History  Problem Relation Age of Onset   Hypertension Mother    Diabetes Mother    Asthma Mother    CAD Father    Diabetes Sister    Diabetes Brother    Hypertension Brother      Physical Exam: Vitals:   07/12/23 2200 07/12/23 2237 07/12/23 2253 07/13/23 0302  BP: (!) 156/86 (!) 172/80  (!) 149/85  Pulse: (!) 103 (!) 104  (!) 102  Resp: 17 18  18   Temp:  99.5 F (  37.5 C)  99.1 F (37.3 C)  TempSrc:  Oral  Oral  SpO2: 96% 98%  97%  Weight:   112.3 kg   Height:   5\' 8"  (1.727 m)     Gen: Awake, alert, NAD  HEENT: See photos for progression of facial cellulitis over past day.  There is erythema with induration and a focal area of nodular swelling with a central punctum which is not openly draining over the pre-Malar area.  There is edema and erythema specially of the upper eyelid.  EOMI are intact.  PERRL. CV: Regular, normal S1, S2, no murmurs  Resp: Normal WOB, CTAB  Abd: Obese, normoactive, nontender MSK: Symmetric, no edema  Skin: See HEENT Neuro: Alert and interactive  Psych: euthymic, appropriate    Data review:   Labs reviewed, notable for:   Hyperglycemic in the 390s WBC 7  Micro:  Results for orders placed or performed in visit on 03/06/19  WOUND CULTURE     Status: None   Collection Time: 03/06/19  1:41 PM   Specimen: Toe, Left; Wound  Result Value Ref Range Status   MICRO NUMBER: [id number]  Final   SPECIMEN QUALITY: Adequate  Final   SOURCE: NOT GIVEN  Final   STATUS: FINAL  Final   GRAM STAIN:    Final    Rare Polymorphonuclear leukocytes No organisms seen   RESULT: No Growth  Final    Imaging reviewed:  CT Maxillofacial W Contrast  Result Date: 07/12/2023 CLINICAL DATA:  Skin infection on left cheek, swelling to left cheek and eye EXAM: CT MAXILLOFACIAL WITH CONTRAST TECHNIQUE: Multidetector CT imaging of the maxillofacial structures was performed with intravenous contrast. Multiplanar CT image reconstructions were also generated. RADIATION DOSE REDUCTION: This exam was performed according to the departmental dose-optimization program which includes automated exposure control, adjustment of the mA and/or kV according to patient size and/or use of iterative reconstruction technique. CONTRAST:  80mL OMNIPAQUE IOHEXOL 300 MG/ML  SOLN COMPARISON:  None Available. FINDINGS: Osseous: No fracture or mandibular dislocation. No destructive process. Orbits: Negative. No traumatic or inflammatory finding. Sinuses: Minimal mucosal thickening in the left maxillary sinus. Otherwise clear paranasal sinuses. The mastoids are well aerated. Soft tissues: Hyperenhancement in the medial left cheek (series 2, image 70 and series 7, image 75), with surrounding fat stranding and skin thickening that extends to the left periorbital and premalar soft tissues. No focal low-density collection to suggest abscess or phlegmon. Limited intracranial: No significant or unexpected finding. IMPRESSION: Cellulitis in the medial left cheek that extends to the left periorbital and premalar soft tissues. No focal low-density collection to suggest abscess or phlegmon. Electronically Signed   By: Wiliam Ke M.D.   On: 07/12/2023 19:30    ED Course:  At [facility] ED treated with vancomycin, ceftriaxone, 1 L normal saline   Assessment/Plan:  52 y.o. female with hx DM type 2, obesity, psoriatic arthritis on Ixekizumab, HTN, HLD, hx SVT, who was transferred from [facility] ED for facial cellulitis with preseptal cellulitis  Facial  cellulitis, preseptal cellulitis, left sided  Above occurring in setting of uncontrolled DM secondary to insulin non-adherence, also at risk of infection with Anti IL17 for hx psoriatic arthritis. There has been some progression in extent of erythema and development of a central punctum without drainage since her transfer from [facility]. However remains without findings of orbital cellulitis clinically. CT Max / face was negative for abnormality in the orbit and demonstrating soft tissue swelling, stranding, and enhancement in the  L medial cheek, extending to premalar and periorbital soft tissue. No abscess or phlegmon seen on imaging.  - ENT consult, discussed with Dr. Jenne Pane overnight, ENT to see in the morning.  - Continue Vancomycin pharmacy to dose, Ceftriaxone 2 g IV q 24 hr.  - Remains tachycardic, received 1 L IVF at OSH. Check lactate  - If there is further or more rapid progression/necrosis, would broaden to include pseudomonas coverage with recent hot tube exposure, possible empiric mucor coverage and surgical debridement.  - Glycemic control per below  - NPO in case needs debridement or I+D   DM type 2 with uncontrolled hyperglycemia  Hx insulin nonadhernece reported x 1 week, suspect longer with last fill on insulin in 5/'24 although reports taking her sisters insulin in meantime. She is insulin resistant with home regimen Humulin R U-500 180 / 150 / 150 units taken TID with meals.  - Will need more aggressive glycemic control. Trial of initial dose of NPH 100 units this evening. If glycemic control not improving by AM will transfer to stepdown for insulin gtt in setting of her facial cellulitis  - Titration of NPH based on response, add SSI resistant dosing  - Check A1c   Chronic medical problems:  Hx psoriatic arthritis: At risk of infection on Ixekizumab, this should be temporarily discontinued outpatient until infection has cleared. Hold Meloxicam for now.  HTN / Hx SVT: continue home  Verapamil 240 mg daily.  GERD: Continue home Pantoprazole  HLD: Cotninue home Rosuvastatin.  Mood d/o: Continue home Escitalopram  Body mass index is 37.64 kg/m. Obesity class II affecting medical care per above    DVT prophylaxis:  SCDs Code Status:  Full Code Diet:  Diet Orders (From admission, onward)     Start     Ordered   07/12/23 2307  Diet Carb Modified Fluid consistency: Thin; Room service appropriate? Yes  Diet effective now       Question Answer Comment  Diet-HS Snack? Nothing   Calorie Level Medium 1600-2000   Fluid consistency: Thin   Room service appropriate? Yes      07/12/23 2310           Family Communication:  No   Consults:  ENT   Admission status:   Inpatient, Telemetry bed  Severity of Illness: The appropriate patient status for this patient is INPATIENT. Inpatient status is judged to be reasonable and necessary in order to provide the required intensity of service to ensure the patient's safety. The patient's presenting symptoms, physical exam findings, and initial radiographic and laboratory data in the context of their chronic comorbidities is felt to place them at high risk for further clinical deterioration. Furthermore, it is not anticipated that the patient will be medically stable for discharge from the hospital within 2 midnights of admission.   * I certify that at the point of admission it is my clinical judgment that the patient will require inpatient hospital care spanning beyond 2 midnights from the point of admission due to high intensity of service, high risk for further deterioration and high frequency of surveillance required.*   Dolly Rias, MD [facility]  How to contact the [facility] Attending or Consulting provider 7A - 7P or covering provider during after hours 7P -7A, for this patient.  Check the care team in [facility] and look for a) attending/consulting [facility] provider listed and b) the [facility] team listed Log into [email or web address] and  use [place]'s universal password to access. If you do  not have the password, please contact the hospital operator. Locate the [facility] provider you are looking for under [facility] and page to a number that you can be directly reached. If you still have difficulty reaching the provider, please page the [facility] (Director on Call) for the Hospitalists listed on amion for assistance.  07/13/2023, 3:57 AM

## 2023-07-13 NOTE — Consult Note (Signed)
Reason for Consult: Facial cellulitis Referring Physician: Hospitalist  [name] is an 52 y.o. female.  HPI: 52 year old female with diabetes who started with left cheek soreness almost a week ago.  It worsened somewhat early this week and her PCP started her on oral Bactrim.  With worsening in spite of two days of therapy, she was sent to the ER where CT imaging was performed and she was admitted for IV antibiotics.  Past Medical History:  Diagnosis Date   Atrial fibrillation (HCC)    Diabetic retinopathy (HCC)    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)    "been dealing w/it for ~ 1 yr" (05/20/2018)   Polycystic ovarian syndrome    Psoriasis    Psoriatic arthritis (HCC)    "pretty much all over; knees, hands, elbows" (05/20/2018)   SVT (supraventricular tachycardia) (HCC)    "had 1 chemical conversion here in the ER" (05/20/2018)   Type II diabetes mellitus (HCC)     Past Surgical History:  Procedure Laterality Date   AMPUTATION TOE Right 05/22/2018   Procedure: AMPUTATION RIGHT TOE;  Surgeon: Vivi Barrack, DPM;  Location: [facility] OR;  Service: Podiatry;  Laterality: Right;   CESAREAN SECTION  2005   FOOT FRACTURE SURGERY Left    pin put in by Dr. Ardelle Anton   FRACTURE SURGERY     TUBAL LIGATION  2005    Family History  Problem Relation Age of Onset   Hypertension Mother    Diabetes Mother    Asthma Mother    CAD Father    Diabetes Sister    Diabetes Brother    Hypertension Brother     Social History:  reports that she has never smoked. She has never used smokeless tobacco. She reports that she does not currently use alcohol. She reports that she does not use drugs.  Allergies:  Allergies  Allergen Reactions   Cephalosporins Hives   Apremilast Diarrhea   Pneumococcal Vac Polyvalent     Other Reaction(s): swelling, itching, and redness at site of intradermal Pneumovax injection   Synjardy [Empagliflozin-Metformin Hcl] Other (See Comments)    Yeast Infection    Trulicity [Dulaglutide] Diarrhea and Nausea And Vomiting   Ciprofloxacin Rash   Keflex [Cephalexin] Rash   Latex Rash    Medications: I have reviewed the patient's current medications.  Results for orders placed or performed during the hospital encounter of 07/12/23 (from the past 48 hour(s))  CBC with Differential     Status: None   Collection Time: 07/12/23  5:28 PM  Result Value Ref Range   WBC 7.7 4.0 - 10.5 K/uL   RBC 4.95 3.87 - 5.11 MIL/uL   Hemoglobin 14.8 12.0 - 15.0 g/dL   HCT 82.9 56.2 - 13.0 %   MCV 88.5 80.0 - 100.0 fL   MCH 29.9 26.0 - 34.0 pg   MCHC 33.8 30.0 - 36.0 g/dL   RDW 86.5 78.4 - 69.6 %   Platelets 254 150 - 400 K/uL   nRBC 0.0 0.0 - 0.2 %   Neutrophils Relative % 80 %   Neutro Abs 6.1 1.7 - 7.7 K/uL   Lymphocytes Relative 12 %   Lymphs Abs 0.9 0.7 - 4.0 K/uL   Monocytes Relative 7 %   Monocytes Absolute 0.5 0.1 - 1.0 K/uL   Eosinophils Relative 1 %   Eosinophils Absolute 0.1 0.0 - 0.5 K/uL   Basophils Relative 0 %   Basophils Absolute 0.0 0.0 - 0.1  K/uL   Immature Granulocytes 0 %   Abs Immature Granulocytes 0.03 0.00 - 0.07 K/uL    Comment: Performed at [facility], [address] [place], [place] [address]  Comprehensive metabolic panel     Status: Abnormal   Collection Time: 07/12/23  5:28 PM  Result Value Ref Range   Sodium 130 (L) 135 - 145 mmol/L   Potassium 3.8 3.5 - 5.1 mmol/L   Chloride 97 (L) 98 - 111 mmol/L   CO2 26 22 - 32 mmol/L   Glucose, Bld 397 (H) 70 - 99 mg/dL    Comment: Glucose reference range applies only to samples taken after fasting for at least 8 hours.   BUN 8 6 - 20 mg/dL   Creatinine, Ser 1.66 0.44 - 1.00 mg/dL   Calcium 9.0 8.9 - 06.3 mg/dL   Total Protein 8.2 (H) 6.5 - 8.1 g/dL   Albumin 3.9 3.5 - 5.0 g/dL   AST 11 (L) 15 - 41 U/L   ALT 10 0 - 44 U/L   Alkaline Phosphatase 92 38 - 126 U/L   Total Bilirubin 0.7 0.3 - 1.2 mg/dL   GFR, Estimated >01 >60 mL/min    Comment: (NOTE) Calculated  using the CKD-EPI Creatinine Equation (2021)    Anion gap 7 5 - 15    Comment: Performed at [facility], [address] [place], [place] [address]  Glucose, capillary     Status: Abnormal   Collection Time: 07/13/23  1:01 AM  Result Value Ref Range   Glucose-Capillary 387 (H) 70 - 99 mg/dL    Comment: Glucose reference range applies only to samples taken after fasting for at least 8 hours.  Glucose, capillary     Status: Abnormal   Collection Time: 07/13/23  3:40 AM  Result Value Ref Range   Glucose-Capillary 315 (H) 70 - 99 mg/dL    Comment: Glucose reference range applies only to samples taken after fasting for at least 8 hours.  Basic metabolic panel     Status: Abnormal   Collection Time: 07/13/23  6:27 AM  Result Value Ref Range   Sodium 134 (L) 135 - 145 mmol/L   Potassium 3.3 (L) 3.5 - 5.1 mmol/L   Chloride 101 98 - 111 mmol/L   CO2 23 22 - 32 mmol/L   Glucose, Bld 272 (H) 70 - 99 mg/dL    Comment: Glucose reference range applies only to samples taken after fasting for at least 8 hours.   BUN 7 6 - 20 mg/dL   Creatinine, Ser 3.55 (L) 0.44 - 1.00 mg/dL   Calcium 8.4 (L) 8.9 - 10.3 mg/dL   GFR, Estimated >73 >22 mL/min    Comment: (NOTE) Calculated using the CKD-EPI Creatinine Equation (2021)    Anion gap 10 5 - 15    Comment: Performed at [facility], [address], [place], [place] [address]  CBC     Status: None   Collection Time: 07/13/23  6:27 AM  Result Value Ref Range   WBC 7.1 4.0 - 10.5 K/uL   RBC 4.76 3.87 - 5.11 MIL/uL   Hemoglobin 14.3 12.0 - 15.0 g/dL   HCT 70.6 23.7 - 62.8 %   MCV 91.4 80.0 - 100.0 fL   MCH 30.0 26.0 - 34.0 pg   MCHC 32.9 30.0 - 36.0 g/dL   RDW 31.5 17.6 - 16.0 %   Platelets 243 150 - 400 K/uL   nRBC 0.0 0.0 - 0.2 %  Comment: Performed at [facility], [address], [place], [place] [address]  Magnesium     Status: None   Collection Time: 07/13/23  6:27 AM  Result  Value Ref Range   Magnesium 1.9 1.7 - 2.4 mg/dL    Comment: Performed at [facility], [address] [place], [place] [address]  Phosphorus     Status: None   Collection Time: 07/13/23  6:27 AM  Result Value Ref Range   Phosphorus 3.1 2.5 - 4.6 mg/dL    Comment: Performed at [facility], [address], [place], [place] [address]  Hemoglobin A1c     Status: Abnormal   Collection Time: 07/13/23  6:27 AM  Result Value Ref Range   Hgb A1c MFr Bld 13.5 (H) 4.8 - 5.6 %    Comment: (NOTE) Pre diabetes:          5.7%-6.4%  Diabetes:              >6.4%  Glycemic control for   <7.0% adults with diabetes    Mean Plasma Glucose 340.75 mg/dL    Comment: Performed at [facility], [address] [place], [place] [address]  Lactic acid, plasma     Status: None   Collection Time: 07/13/23  6:27 AM  Result Value Ref Range   Lactic Acid, Venous 0.9 0.5 - 1.9 mmol/L    Comment: Performed at [facility], [address], [place], [place] [address]  Glucose, capillary     Status: Abnormal   Collection Time: 07/13/23  7:24 AM  Result Value Ref Range   Glucose-Capillary 272 (H) 70 - 99 mg/dL    Comment: Glucose reference range applies only to samples taken after fasting for at least 8 hours.    CT Maxillofacial W Contrast  Result Date: 07/12/2023 CLINICAL DATA:  Skin infection on left cheek, swelling to left cheek and eye EXAM: CT MAXILLOFACIAL WITH CONTRAST TECHNIQUE: Multidetector CT imaging of the maxillofacial structures was performed with intravenous contrast. Multiplanar CT image reconstructions were also generated. RADIATION DOSE REDUCTION: This exam was performed according to the departmental dose-optimization program which includes automated exposure control, adjustment of the mA and/or kV according to patient size and/or use of iterative reconstruction technique. CONTRAST:  80mL OMNIPAQUE IOHEXOL 300 MG/ML  SOLN COMPARISON:   None Available. FINDINGS: Osseous: No fracture or mandibular dislocation. No destructive process. Orbits: Negative. No traumatic or inflammatory finding. Sinuses: Minimal mucosal thickening in the left maxillary sinus. Otherwise clear paranasal sinuses. The mastoids are well aerated. Soft tissues: Hyperenhancement in the medial left cheek (series 2, image 70 and series 7, image 75), with surrounding fat stranding and skin thickening that extends to the left periorbital and premalar soft tissues. No focal low-density collection to suggest abscess or phlegmon. Limited intracranial: No significant or unexpected finding. IMPRESSION: Cellulitis in the medial left cheek that extends to the left periorbital and premalar soft tissues. No focal low-density collection to suggest abscess or phlegmon. Electronically Signed   By: Wiliam Ke M.D.   On: 07/12/2023 19:30    Review of Systems  HENT:  Positive for facial swelling.   All other systems reviewed and are negative.  Blood pressure 134/70, pulse (!) 101, temperature 98.6 F (37 C), temperature source Oral, resp. rate 18, height 5\' 8"  (1.727 m), weight 112.3 kg, SpO2 97%. Physical Exam Constitutional:      Appearance: Normal appearance.  HENT:     Head:  Atraumatic.     Comments: Left cheek with firm, erythematous, and tender area medially with central pustule.  No fluctuance.  Left eyelids with some edema.    Right Ear: External ear normal.     Left Ear: External ear normal.     Nose: Nose normal.     Mouth/Throat:     Mouth: Mucous membranes are moist.     Pharynx: Oropharynx is clear.  Eyes:     Extraocular Movements: Extraocular movements intact.     Conjunctiva/sclera: Conjunctivae normal.     Pupils: Pupils are equal, round, and reactive to light.  Cardiovascular:     Rate and Rhythm: Normal rate.  Pulmonary:     Effort: Pulmonary effort is normal.  Musculoskeletal:     Cervical back: Normal range of motion.  Neurological:     General:  No focal deficit present.     Mental Status: She is alert and oriented to person, place, and time.  Psychiatric:        Mood and Affect: Mood normal.        Behavior: Behavior normal.        Thought Content: Thought content normal.        Judgment: Judgment normal.     Assessment/Plan: Left facial cellulitis  I personally reviewed her maxillofacial CT demonstrating no surgically drainable fluid collection.  She is afebrile with normal WBC.  I unroofed the pustule and sent a culture swab.  She can have a diet since there is no surgical indication.  More than likely, this represents a MRSA infection.  She is being treated with Rocephin and vancomycin.  Please call back if any further assistance needed.  Christia Reading 07/13/2023, 11:16 AM

## 2023-07-13 NOTE — Progress Notes (Signed)
Interim progress note not for billing  I have reviewed the patient's chart, seen and examined the patient. I agree with the h and p as written. Admitted for cellulitis/abscess of left cheek, ct showing extension to periorbital and premalar soft tissues. Eom intact. Reports interval improvement in pain/swelling/pruritus since starting abx in our ER. ENT has been consulted and their eval is pending. Suspect this is mrsa and that they will perform bedside I and d and that patient can discharge tomorrow assuming she continues to progress.

## 2023-07-14 DIAGNOSIS — I480 Paroxysmal atrial fibrillation: Secondary | ICD-10-CM

## 2023-07-14 DIAGNOSIS — L03211 Cellulitis of face: Secondary | ICD-10-CM | POA: Diagnosis not present

## 2023-07-14 DIAGNOSIS — Z6836 Body mass index (BMI) 36.0-36.9, adult: Secondary | ICD-10-CM

## 2023-07-14 DIAGNOSIS — E119 Type 2 diabetes mellitus without complications: Secondary | ICD-10-CM

## 2023-07-14 DIAGNOSIS — Z794 Long term (current) use of insulin: Secondary | ICD-10-CM

## 2023-07-14 LAB — RENAL FUNCTION PANEL
Albumin: 3 g/dL — ABNORMAL LOW (ref 3.5–5.0)
Anion gap: 9 (ref 5–15)
BUN: 8 mg/dL (ref 6–20)
CO2: 26 mmol/L (ref 22–32)
Calcium: 8.3 mg/dL — ABNORMAL LOW (ref 8.9–10.3)
Chloride: 99 mmol/L (ref 98–111)
Creatinine, Ser: 0.46 mg/dL (ref 0.44–1.00)
GFR, Estimated: >60 mL/min (ref >60)
Glucose, Bld: 261 mg/dL — ABNORMAL HIGH (ref 70–99)
Phosphorus: 3.3 mg/dL (ref 2.5–4.6)
Potassium: 3.6 mmol/L (ref 3.5–5.1)
Sodium: 134 mmol/L — ABNORMAL LOW (ref 135–145)

## 2023-07-14 LAB — CBC
HCT: 44.1 % (ref 36.0–46.0)
Hemoglobin: 14.7 g/dL (ref 12.0–15.0)
MCH: 30.5 pg (ref 26.0–34.0)
MCHC: 33.3 g/dL (ref 30.0–36.0)
MCV: 91.5 fL (ref 80.0–100.0)
Platelets: 251 10*3/uL (ref 150–400)
RBC: 4.82 MIL/uL (ref 3.87–5.11)
RDW: 13.5 % (ref 11.5–15.5)
WBC: 6.7 10*3/uL (ref 4.0–10.5)
nRBC: 0 % (ref 0.0–0.2)

## 2023-07-14 LAB — GLUCOSE, CAPILLARY
Glucose-Capillary: 223 mg/dL — ABNORMAL HIGH (ref 70–99)
Glucose-Capillary: 238 mg/dL — ABNORMAL HIGH (ref 70–99)

## 2023-07-14 LAB — MAGNESIUM: Magnesium: 1.8 mg/dL (ref 1.7–2.4)

## 2023-07-14 MED ORDER — DOXYCYCLINE HYCLATE 100 MG PO CAPS: 100.0000 mg | ORAL_CAPSULE | Freq: Two times a day (BID) | ORAL | 0 refills | Status: AC

## 2023-07-14 NOTE — Progress Notes (Signed)
The patient is stable. No change from am assessment. Discharge instructions reviewed. Questions, concerns denied at this time. Signs symptoms reviewed for infection and hand hygiene.

## 2023-07-14 NOTE — Discharge Summary (Signed)
Physician Discharge Summary  [name] WGN:[id number] DOB: 07/17/1971 DOA: 07/12/2023  PCP: Thana Ates, MD  Admit date: 07/12/2023 Discharge date: 07/14/2023 Admitted From: Home Disposition: Home Recommendations for Outpatient Follow-up:  Follow up with PCP/endocrinology in a week. Assess glycemic control, facial cellulitis at follow-up Check CMP and CBC at follow-up Please follow up on the following pending results: Abscess culture  Home Health: Not indicated Equipment/Devices: Not indicated  Discharge Condition: Stable CODE STATUS: Full code  Follow-up Information     Thana Ates, MD. Schedule an appointment as soon as possible for a visit in 1 week(s).   Specialty: Internal Medicine Contact information: [address] [place] [address] [phone]                 Hospital course 52 year old F with PMH of IDDM-2, morbid obesity, psoriatic arthritis on Ixekizumab, SVT and noncompliance with treatment presented to ED with progressive left facial cellulitis for 3 days and admitted for the same.  Patient has poorly controlled diabetes.  Has not used insulin in about a week. aking insulin completely 1 week prior to her admission.  She works as an MA for an endocrinologist.  Maxillofacial CT showed cellulitis in the medial left cheek that extends to the left periorbital and premalar soft tissues.  Patient was started on vancomycin and ceftriaxone.  ENT consulted and unroofed one of the pustules.  Culture with Staph aureus.    On the day of discharge, patient reported significant improvement in his cellulitis.  She has no visual disturbance or impaired extraocular movement.  She felt well and wanted to go home on oral antibiotics to complete treatment course.  Culture sensitivity pending at time of discharge.  She is discharged on p.o. doxycycline for 7 more days.  She understands the risk of coming back to the hospital if Staph aureus is not  sensitive to oral antibiotics.  Also recommended warm compresses.   Patient with poorly controlled diabetes due to noncompliance.  A1c 13.5%.  She has been extensively counseled on the importance of good glycemic control.  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   See individual problem list below for more.   Problems addressed during this hospitalization Principal Problem:   Cellulitis of face Active Problems:   Insulin-requiring or dependent type II diabetes mellitus (HCC)   AF (paroxysmal atrial fibrillation) (HCC)   Body mass index (BMI) 36.0-36.9, adult   Psoriasis   Facial cellulitis              Time spent 35 minutes  Vital signs Vitals:   07/13/23 1022 07/13/23 2050 07/14/23 0508 07/14/23 0813  BP: 134/70 131/69 (!) 142/76 (!) 150/83  Pulse: (!) 101 96 90 99  Temp: 98.6 F (37 C) 99 F (37.2 C) 98.1 F (36.7 C) 98 F (36.7 C)  Resp:  18 14 17   Height:      Weight:      SpO2: 97% 96% 97% 96%  TempSrc: Oral Oral Oral Oral  BMI (Calculated):         Discharge exam  GENERAL: No apparent distress.  Nontoxic. HEENT: MMM.  Vision and hearing grossly intact.  EOMI.  PERRL. NECK: Supple.  No apparent JVD.  RESP:  No IWOB.  Fair aeration bilaterally. CVS:  RRR. Heart sounds normal.  ABD/GI/GU: BS+. Abd soft, NTND.  MSK/EXT:  Moves extremities. No apparent deformity. No edema.  SKIN: Erythema and induration  over left cheek with two pustular spots.  See picture below for more. NEURO: Awake and alert. Oriented appropriately.  No apparent focal neuro deficit. PSYCH: Calm. Normal affect.     Discharge Instructions Discharge Instructions     Diet Carb Modified   Complete by: As directed    Discharge instructions   Complete by: As directed    It has been a pleasure taking care of you! You were hospitalized with cellulitis for which you have been started on antibiotics. We are discharging you on more  antibiotics to complete treatment course. We are still following on your culture. Use your insulin as prescribed.  Please review your new medication list and the directions before you take your medications.  Take care,   Increase activity slowly   Complete by: As directed       Allergies as of 07/14/2023       Reactions   Cephalosporins Hives   Apremilast Diarrhea   Pneumococcal Vac Polyvalent    Other Reaction(s): swelling, itching, and redness at site of intradermal Pneumovax injection   Synjardy [empagliflozin-metformin Hcl] Other (See Comments)   Yeast Infection   Trulicity [dulaglutide] Diarrhea, Nausea And Vomiting   Ciprofloxacin Rash   Keflex [cephalexin] Rash   Latex Rash        Medication List     STOP taking these medications    amoxicillin-clavulanate 875-125 MG tablet Commonly known as: AUGMENTIN   HYDROcodone-acetaminophen 5-325 MG tablet Commonly known as: NORCO/VICODIN   silver sulfADIAZINE 1 % cream Commonly known as: Silvadene   sulfamethoxazole-trimethoprim 800-160 MG tablet Commonly known as: BACTRIM DS       TAKE these medications    acetaminophen 500 MG tablet Commonly known as: TYLENOL Take 1,000 mg by mouth every 6 (six) hours as needed for mild pain or headache.   albuterol 108 (90 Base) MCG/ACT inhaler Commonly known as: VENTOLIN HFA Inhale 2 puffs into the lungs every 6 (six) hours as needed for wheezing or shortness of breath.   clotrimazole 10 MG troche Commonly known as: MYCELEX Dissolve 1 troche (tablet) (10 mg total) by mouth 4 to 5 (five) times daily until gone.   doxycycline 100 MG capsule Commonly known as: VIBRAMYCIN Take 1 capsule (100 mg total) by mouth 2 (two) times daily for 7 days.   escitalopram 20 MG tablet Commonly known as: LEXAPRO Take 20 mg by mouth daily.   estradiol 0.1 MG/GM vaginal cream Commonly known as: ESTRACE Place 1 Applicatorful vaginally once a week.   fluconazole 150 MG tablet Commonly  known as: DIFLUCAN Take 150 mg by mouth as needed (bacterial infection).   HumuLIN R U-500 KwikPen 500 UNIT/ML KwikPen Generic drug: insulin regular human CONCENTRATED Inject 150-180 Units into the skin in the morning, at noon, and at bedtime. Inject 180 units in morning, 150 units at lunch and dinner   ibuprofen 200 MG tablet Commonly known as: ADVIL Take 800 mg by mouth daily as needed (pain).   meloxicam 15 MG tablet Commonly known as: MOBIC Take 15 mg by mouth daily.   multivitamin with minerals Tabs tablet Take 1 tablet by mouth daily.   mupirocin ointment 2 % Commonly known as: BACTROBAN Apply 1 application topically 2 (two) times daily.   norethindrone 5 MG tablet Commonly known as: AYGESTIN Take 5 mg by mouth daily.   nystatin-triamcinolone cream Commonly known as: MYCOLOG II Apply 1 application topically daily as needed (rash/irritation).   omeprazole 20 MG capsule Commonly known as: PRILOSEC Take  20 mg by mouth every morning.   rosuvastatin 5 MG tablet Commonly known as: CRESTOR Take 5 mg by mouth daily.   Taltz 80 MG/ML pen Generic drug: ixekizumab Inject 80 mg into the skin every 28 (twenty-eight) days.   verapamil 240 MG CR tablet Commonly known as: CALAN-SR TAKE 1 TABLET BY MOUTH AT BEDTIME What changed: when to take this   Vitamin D3 125 MCG (5000 UT) Caps Take 5,000 Units by mouth daily.   Vitamin D-3 125 MCG (5000 UT) Tabs Take 1 tablet by mouth daily.        Consultations: ENT  Procedures/Studies:   CT Maxillofacial W Contrast  Result Date: 07/12/2023 CLINICAL DATA:  Skin infection on left cheek, swelling to left cheek and eye EXAM: CT MAXILLOFACIAL WITH CONTRAST TECHNIQUE: Multidetector CT imaging of the maxillofacial structures was performed with intravenous contrast. Multiplanar CT image reconstructions were also generated. RADIATION DOSE REDUCTION: This exam was performed according to the departmental dose-optimization program  which includes automated exposure control, adjustment of the mA and/or kV according to patient size and/or use of iterative reconstruction technique. CONTRAST:  80mL OMNIPAQUE IOHEXOL 300 MG/ML  SOLN COMPARISON:  None Available. FINDINGS: Osseous: No fracture or mandibular dislocation. No destructive process. Orbits: Negative. No traumatic or inflammatory finding. Sinuses: Minimal mucosal thickening in the left maxillary sinus. Otherwise clear paranasal sinuses. The mastoids are well aerated. Soft tissues: Hyperenhancement in the medial left cheek (series 2, image 70 and series 7, image 75), with surrounding fat stranding and skin thickening that extends to the left periorbital and premalar soft tissues. No focal low-density collection to suggest abscess or phlegmon. Limited intracranial: No significant or unexpected finding. IMPRESSION: Cellulitis in the medial left cheek that extends to the left periorbital and premalar soft tissues. No focal low-density collection to suggest abscess or phlegmon. Electronically Signed   By: Wiliam Ke M.D.   On: 07/12/2023 19:30       The results of significant diagnostics from this hospitalization (including imaging, microbiology, ancillary and laboratory) are listed below for reference.     Microbiology: Recent Results (from the past 240 hour(s))  Aerobic Culture w Gram Stain (superficial specimen)     Status: None (Preliminary result)   Collection Time: 07/13/23 11:16 AM   Specimen: Face  Result Value Ref Range Status   Specimen Description   Final    FACE Performed at [facility], [address] [place], [place] [address]    Special Requests   Final    NONE Performed at [facility], [address], [place], [place] [address]    Gram Stain   Final    FEW WBC PRESENT, PREDOMINANTLY PMN RARE GRAM POSITIVE COCCI    Culture   Final    MODERATE STAPHYLOCOCCUS AUREUS SUSCEPTIBILITIES TO FOLLOW Performed at [facility], [address], [place], [place] [address]    Report Status PENDING  Incomplete     Labs:  CBC: Recent Labs  Lab 07/12/23 1728 07/13/23 0627 07/14/23 0828  WBC 7.7 7.1 6.7  NEUTROABS 6.1  --   --   HGB 14.8 14.3 14.7  HCT 43.8 43.5 44.1  MCV 88.5 91.4 91.5  PLT 254 243 251   BMP &GFR Recent Labs  Lab 07/12/23 1728 07/13/23 0627 07/14/23 0828  NA 130* 134* 134*  K 3.8 3.3* 3.6  CL 97* 101 99  CO2 26 23 26   GLUCOSE 397* 272* 261*  BUN 8 7 8  CREATININE 0.58 0.38* 0.46  CALCIUM 9.0 8.4* 8.3*  MG  --  1.9 1.8  PHOS  --  3.1 3.3   Estimated Creatinine Clearance: 108.2 mL/min (by C-G formula based on SCr of 0.46 mg/dL). Liver & Pancreas: Recent Labs  Lab 07/12/23 1728 07/14/23 0828  AST 11*  --   ALT 10  --   ALKPHOS 92  --   BILITOT 0.7  --   PROT 8.2*  --   ALBUMIN 3.9 3.0*   No results for input(s): "LIPASE", "AMYLASE" in the last 168 hours. No results for input(s): "AMMONIA" in the last 168 hours. Diabetic: Recent Labs    07/13/23 0627  HGBA1C 13.5*   Recent Labs  Lab 07/13/23 1130 07/13/23 1625 07/13/23 2044 07/14/23 0750 07/14/23 1157  GLUCAP 232* 239* 280* 223* 238*   Cardiac Enzymes: No results for input(s): "CKTOTAL", "CKMB", "CKMBINDEX", "TROPONINI" in the last 168 hours. No results for input(s): "PROBNP" in the last 8760 hours. Coagulation Profile: No results for input(s): "INR", "PROTIME" in the last 168 hours. Thyroid Function Tests: No results for input(s): "TSH", "T4TOTAL", "FREET4", "T3FREE", "THYROIDAB" in the last 72 hours. Lipid Profile: No results for input(s): "CHOL", "HDL", "LDLCALC", "TRIG", "CHOLHDL", "LDLDIRECT" in the last 72 hours. Anemia Panel: No results for input(s): "VITAMINB12", "FOLATE", "FERRITIN", "TIBC", "IRON", "RETICCTPCT" in the last 72 hours. Urine analysis:    Component Value Date/Time   COLORURINE YELLOW 10/20/2018 1935   APPEARANCEUR HAZY (A) 10/20/2018 1935   LABSPEC 1.032 (H)  10/20/2018 1935   PHURINE 5.0 10/20/2018 1935   GLUCOSEU >=500 (A) 10/20/2018 1935   HGBUR LARGE (A) 10/20/2018 1935   BILIRUBINUR NEGATIVE 10/20/2018 1935   KETONESUR NEGATIVE 10/20/2018 1935   PROTEINUR NEGATIVE 10/20/2018 1935   UROBILINOGEN 0.2 10/09/2011 1807   NITRITE NEGATIVE 10/20/2018 1935   LEUKOCYTESUR NEGATIVE 10/20/2018 1935   Sepsis Labs: Invalid input(s): "PROCALCITONIN", "LACTICIDVEN"   SIGNED:  Almon Hercules, MD  [facility] 07/14/2023, 5:59 PM

## 2023-07-14 NOTE — Plan of Care (Signed)
  Problem: Education: Goal: Knowledge of General Education information will improve Description: Including pain rating scale, medication(s)/side effects and non-pharmacologic comfort measures Outcome: Progressing   Problem: Health Behavior/Discharge Planning: Goal: Ability to manage health-related needs will improve Outcome: Progressing   Problem: Clinical Measurements: Goal: Ability to maintain clinical measurements within normal limits will improve Outcome: Progressing Goal: Will remain free from infection Outcome: Progressing Goal: Diagnostic test results will improve Outcome: Progressing   Problem: Activity: Goal: Risk for activity intolerance will decrease Outcome: Progressing   Problem: Nutrition: Goal: Adequate nutrition will be maintained Outcome: Progressing   Problem: Coping: Goal: Level of anxiety will decrease Outcome: Progressing   Problem: Elimination: Goal: Will not experience complications related to bowel motility Outcome: Progressing   Problem: Pain Management: Goal: General experience of comfort will improve Outcome: Progressing   Problem: Skin Integrity: Goal: Risk for impaired skin integrity will decrease Outcome: Progressing   Problem: Skin Integrity: Goal: Risk for impaired skin integrity will decrease Outcome: Progressing

## 2023-07-15 LAB — AEROBIC CULTURE W GRAM STAIN (SUPERFICIAL SPECIMEN)

## 2023-07-16 ENCOUNTER — Encounter: Payer: Self-pay | Admitting: Podiatry

## 2023-07-25 ENCOUNTER — Ambulatory Visit
Admission: RE | Admit: 2023-07-25 | Discharge: 2023-07-25 | Disposition: A | Discharge status: Home / Self Care | Payer: No Typology Code available for payment source | Source: Ambulatory Visit | Attending: Internal Medicine | Admitting: Internal Medicine

## 2023-07-25 DIAGNOSIS — Z1231 Encounter for screening mammogram for malignant neoplasm of breast: Secondary | ICD-10-CM

## 2023-10-05 ENCOUNTER — Ambulatory Visit: Payer: No Typology Code available for payment source | Admitting: Podiatry

## 2023-10-12 ENCOUNTER — Encounter: Payer: Self-pay | Admitting: Podiatry

## 2023-10-12 ENCOUNTER — Ambulatory Visit (INDEPENDENT_AMBULATORY_CARE_PROVIDER_SITE_OTHER): Payer: No Typology Code available for payment source | Admitting: Podiatry

## 2023-10-12 DIAGNOSIS — B351 Tinea unguium: Secondary | ICD-10-CM | POA: Diagnosis not present

## 2023-10-12 DIAGNOSIS — M79674 Pain in right toe(s): Secondary | ICD-10-CM | POA: Diagnosis not present

## 2023-10-12 DIAGNOSIS — E1149 Type 2 diabetes mellitus with other diabetic neurological complication: Secondary | ICD-10-CM

## 2023-10-12 DIAGNOSIS — M79675 Pain in left toe(s): Secondary | ICD-10-CM | POA: Diagnosis not present

## 2023-10-12 NOTE — Patient Instructions (Signed)
Choose a moisturizer from  the list below:  For normal skin: Moisturize feet once daily; do not apply between toes A.  CeraVe Daily Moisturizing Lotion B.  Lubriderm Advanced Therapy Lotion or Lubriderm Intense Skin Repair Lotion C.  Aquaphor Intensive Repair Lotion D.  Gold Bond Ultimate Diabetic Foot Lotion E.  Eucerin Intensive Repair Moisturizing Lotion  For extremely dry, cracked feet: moisturize feet once daily; do not apply between toes A. CeraVe Healing Ointment B. Eucerin Aquaphor Repairing Ointment (may be labeled Aquaphor Healing Ointment) C. Vaseline Petroleum Healing Jelly   If you have problems reaching your feet: apply to feet once daily; do not apply between toes A.  Eucerin Aquaphor Ointment Body Spray  B.  Vaseline Intensive Care Spray Moisturizer (Unscented,  Cocoa Radiant Spray or Aloe Smooth Spray)

## 2023-10-12 NOTE — Progress Notes (Signed)
Subjective: 53 year old female presents to the office for diabetic foot exam.  States she cannot states that she is doing well.  Nails are thickened elongated she has difficulty trimming them herself.  She also has dry skin.  She also discussed with her hands.  No open lesions that she reports.  Objective: AAO x3, NAD DP/PT pulses palpable bilaterally, CRT less than 3 seconds Sensation decreased Nails are hypertrophic, dystrophic, brittle, discolored, elongated 10. No surrounding redness or drainage. Tenderness nails 2-5 on the right and 1-5 on the left. No open lesions or pre-ulcerative lesions are identified today.  Right hallux amputation  Dry skin present without any skin fissures, open lesions Patient sites well-healed. No pain with calf compression, swelling, warmth, erythema  Assessment: 53 year old female with symptomatic onychomycosis, diabetic foot exam  Plan: -All treatment options discussed with the patient including all alternatives, risks, complications.  -Separate debrided nails x 10 without any complications or bleeding -Continue moisturizer daily -Daily foot inspection, glucose control. -Patient encouraged to call the office with any questions, concerns, change in symptoms.   Vivi Barrack DPM

## 2023-12-03 ENCOUNTER — Other Ambulatory Visit: Payer: Self-pay

## 2023-12-03 MED ORDER — POLYMYXIN B-TRIMETHOPRIM 10000-0.1 UNIT/ML-% OP SOLN
1.0000 [drp] | Freq: Four times a day (QID) | OPHTHALMIC | 0 refills | Status: DC
  Filled 2023-12-03: qty 10, 50d supply, fill #0

## 2023-12-03 MED ORDER — KETOROLAC TROMETHAMINE 0.5 % OP SOLN
1.0000 [drp] | Freq: Four times a day (QID) | OPHTHALMIC | 0 refills | Status: DC
  Filled 2023-12-03: qty 5, 25d supply, fill #0

## 2023-12-03 MED ORDER — PREDNISOLONE ACETATE 1 % OP SUSP
1.0000 [drp] | Freq: Every day | OPHTHALMIC | 5 refills | Status: DC
  Filled 2023-12-03: qty 5, 100d supply, fill #0

## 2023-12-11 ENCOUNTER — Encounter: Payer: Self-pay | Admitting: Ophthalmology

## 2023-12-20 NOTE — Discharge Instructions (Signed)

## 2023-12-24 NOTE — Anesthesia Preprocedure Evaluation (Signed)
 Anesthesia Evaluation  Patient identified by MRN, date of birth, ID band Patient awake    Reviewed: Allergy & Precautions, H&P , NPO status , Patient's Chart, lab work & pertinent test results  Airway Mallampati: III  TM Distance: >3 FB Neck ROM: Full    Dental no notable dental hx.    Pulmonary neg pulmonary ROS   Pulmonary exam normal breath sounds clear to auscultation       Cardiovascular negative cardio ROS Normal cardiovascular exam Rhythm:Regular Rate:Normal     Neuro/Psych  Headaches  Neuromuscular disease negative neurological ROS  negative psych ROS   GI/Hepatic negative GI ROS, Neg liver ROS,,,  Endo/Other  diabetes    Renal/GU Renal diseasenegative Renal ROS  negative genitourinary   Musculoskeletal negative musculoskeletal ROS (+) Arthritis ,    Abdominal   Peds negative pediatric ROS (+)  Hematology negative hematology ROS (+)   Anesthesia Other Findings No echo available; had echo in 2014 but I can't find information on what that one said. Case cancelled due to hyperglycemia, blood sugar 420, discussed w/Dr. Druscilla Brownie and with patient. 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  Atrial fibrillation  Psoriasis Psoriatic arthritis  Polycystic ovarian syndrome Type II diabetes mellitus SVT (supraventricular tachycardia)  Diabetic retinopathy (HCC) Diabetic ulcer of right great toe     Reproductive/Obstetrics negative OB ROS                             Anesthesia Physical Anesthesia Plan  ASA: 3  Anesthesia Plan: MAC   Post-op Pain Management:    Induction: Intravenous  PONV Risk Score and Plan:   Airway Management Planned: Natural Airway and Nasal  Cannula  Additional Equipment:   Intra-op Plan:   Post-operative Plan:   Informed Consent: I have reviewed the patients History and Physical, chart, labs and discussed the procedure including the risks, benefits and alternatives for the proposed anesthesia with the patient or authorized representative who has indicated his/her understanding and acceptance.     Dental Advisory Given  Plan Discussed with: Anesthesiologist, CRNA and Surgeon  Anesthesia Plan Comments: (Patient consented for risks of anesthesia including but not limited to:  - adverse reactions to medications - damage to eyes, teeth, lips or other oral mucosa - nerve damage due to positioning  - sore throat or hoarseness - Damage to heart, brain, nerves, lungs, other parts of body or loss of life  Patient voiced understanding and assent.)       Anesthesia Quick Evaluation

## 2023-12-25 ENCOUNTER — Encounter: Payer: Self-pay | Admitting: Ophthalmology

## 2023-12-25 ENCOUNTER — Ambulatory Visit: Payer: Self-pay | Admitting: Anesthesiology

## 2023-12-25 ENCOUNTER — Other Ambulatory Visit: Payer: Self-pay

## 2023-12-25 ENCOUNTER — Ambulatory Visit
Admission: RE | Admit: 2023-12-25 | Discharge: 2023-12-25 | Disposition: A | Discharge status: Home / Self Care | Attending: Ophthalmology | Admitting: Ophthalmology

## 2023-12-25 ENCOUNTER — Encounter
Admission: RE | Disposition: A | Discharge status: Home / Self Care | Payer: Self-pay | Source: Home / Self Care | Attending: Ophthalmology

## 2023-12-25 DIAGNOSIS — E1136 Type 2 diabetes mellitus with diabetic cataract: Secondary | ICD-10-CM | POA: Insufficient documentation

## 2023-12-25 DIAGNOSIS — Z538 Procedure and treatment not carried out for other reasons: Secondary | ICD-10-CM | POA: Insufficient documentation

## 2023-12-25 DIAGNOSIS — H269 Unspecified cataract: Secondary | ICD-10-CM | POA: Insufficient documentation

## 2023-12-25 DIAGNOSIS — E1165 Type 2 diabetes mellitus with hyperglycemia: Secondary | ICD-10-CM | POA: Diagnosis not present

## 2023-12-25 LAB — GLUCOSE, CAPILLARY: Glucose-Capillary: 420 mg/dL — ABNORMAL HIGH (ref 70–99)

## 2023-12-25 SURGERY — EXTRACTION, CATARACT, WITH IOL INSERTION
Anesthesia: Monitor Anesthesia Care | Laterality: Right

## 2023-12-25 MED ORDER — TETRACAINE HCL 0.5 % OP SOLN
OPHTHALMIC | Status: AC
  Filled 2023-12-25: qty 4

## 2023-12-25 MED ORDER — FENTANYL CITRATE (PF) 100 MCG/2ML IJ SOLN
INTRAMUSCULAR | Status: AC
  Filled 2023-12-25: qty 2

## 2023-12-25 MED ORDER — TETRACAINE HCL 0.5 % OP SOLN
1.0000 [drp] | OPHTHALMIC | Status: DC | PRN
Start: 2023-12-25 — End: 2023-12-25

## 2023-12-25 MED ORDER — ARMC OPHTHALMIC DILATING DROPS
OPHTHALMIC | Status: AC
Start: 2023-12-25 — End: ?
  Filled 2023-12-25: qty 0.5

## 2023-12-25 MED ORDER — ARMC OPHTHALMIC DILATING DROPS: 1.0000 | OPHTHALMIC | Status: DC | PRN

## 2023-12-25 MED ORDER — MIDAZOLAM HCL 2 MG/2ML IJ SOLN
INTRAMUSCULAR | Status: AC
  Filled 2023-12-25: qty 2

## 2023-12-25 SURGICAL SUPPLY — 12 items
CANNULA ANT/CHMB 27GA (MISCELLANEOUS) IMPLANT
CATARACT SUITE SIGHTPATH (MISCELLANEOUS) ×1 IMPLANT
CYSTOTOME ANG REV CUT SHRT 25G (CUTTER) ×1 IMPLANT
GLOVE BIOGEL PI IND STRL 8 (GLOVE) ×1 IMPLANT
GLOVE SURG LX STRL 8.0 MICRO (GLOVE) ×1 IMPLANT
GLOVE SURG PROTEXIS BL SZ6.5 (GLOVE) ×1 IMPLANT
GLOVE SURG SYN 8.0 (GLOVE) IMPLANT
NEEDLE FILTER BLUNT 18X1 1/2 (NEEDLE) ×1 IMPLANT
PACK VIT ANT 23G (MISCELLANEOUS) IMPLANT
RING MALYGIN (MISCELLANEOUS) IMPLANT
SUT ETHILON 10-0 CS-B-6CS-B-6 (SUTURE) IMPLANT
SYR 3ML LL SCALE MARK (SYRINGE) ×1 IMPLANT

## 2023-12-25 NOTE — Progress Notes (Signed)
 Unfortunately, blood sugar was 420, which is unsafe, and may lead to diabetic coma and electrolyte imbalance, as well as other adverse effects of diabetes.  Patient is out of insulin, but says [name] can get it when [name] leaves [facility]. I discussed w/Dr. Druscilla Brownie and patient. Patient to reschedule cataract surgery when blood sugar more controlled.  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

## 2023-12-25 NOTE — Progress Notes (Signed)
 Patient case canceled per anesthesia, Blood glucose of 420. Anesthesiologist instructed patient she needs to go to ER refused Ambulance.  Patient stated she has not had her insulin in a few days because her pharmacy did not have it in stock.  Patient vital signs stable.

## 2023-12-25 NOTE — H&P (Signed)
 [facility]   Primary Care Physician:  Thana Ates, MD Ophthalmologist: Dr.  Druscilla Brownie   Pre-Procedure History & Physical: HPI:  [name] is a 53 y.o. female here for cataract surgery.   Past Medical History:  Diagnosis Date   Atrial fibrillation (HCC)    Diabetic retinopathy (HCC)    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)    "been dealing w/it for ~ 1 yr" (05/20/2018)   Polycystic ovarian syndrome    Psoriasis    Psoriatic arthritis (HCC)    "pretty much all over; knees, hands, elbows" (05/20/2018)   SVT (supraventricular tachycardia) (HCC)    "had 1 chemical conversion here in the ER" (05/20/2018)   Type II diabetes mellitus (HCC)     Past Surgical History:  Procedure Laterality Date   AMPUTATION TOE Right 05/22/2018   Procedure: AMPUTATION RIGHT TOE;  Surgeon: Vivi Barrack, DPM;  Location: [facility] OR;  Service: Podiatry;  Laterality: Right;   CESAREAN SECTION  2005   FOOT FRACTURE SURGERY Left    pin put in by Dr. Ardelle Anton   FRACTURE SURGERY     TUBAL LIGATION  2005    Prior to Admission medications   Medication Sig Start Date End Date Taking? Authorizing Provider  acetaminophen (TYLENOL) 500 MG tablet Take 1,000 mg by mouth every 6 (six) hours as needed for mild pain or headache.   Yes [provider]  albuterol (VENTOLIN HFA) 108 (90 Base) MCG/ACT inhaler Inhale 2 puffs into the lungs every 6 (six) hours as needed for wheezing or shortness of breath. 12/03/18  Yes [provider]  Cholecalciferol (VITAMIN D-3) 125 MCG (5000 UT) TABS Take 1 tablet by mouth daily.   Yes [provider]  Cholecalciferol (VITAMIN D3) 5000 units CAPS Take 5,000 Units by mouth daily.   Yes [provider]  escitalopram (LEXAPRO) 20 MG tablet Take 20 mg by mouth daily.   Yes [provider]  estradiol (ESTRACE) 0.1 MG/GM vaginal cream Place 1 Applicatorful vaginally once a week. 10/28/20  Yes [provider]  fluconazole  (DIFLUCAN) 150 MG tablet Take 150 mg by mouth as needed (bacterial infection). 12/19/19  Yes [provider]  HUMULIN R U-500 KWIKPEN 500 UNIT/ML KwikPen Inject 150-180 Units into the skin in the morning, at noon, and at bedtime. Inject 180 units in morning, 150 units at lunch and dinner   Yes [provider]  ibuprofen (ADVIL,MOTRIN) 200 MG tablet Take 800 mg by mouth daily as needed (pain).   Yes [provider]  meloxicam (MOBIC) 15 MG tablet Take 15 mg by mouth daily. 10/12/21  Yes [provider]  norethindrone (AYGESTIN) 5 MG tablet Take 5 mg by mouth daily. 11/10/18  Yes [provider]  nystatin-triamcinolone (MYCOLOG II) cream Apply 1 application topically daily as needed (rash/irritation).  10/01/18  Yes [provider]  omeprazole (PRILOSEC) 20 MG capsule Take 20 mg by mouth every morning. 02/26/20  Yes [provider]  prednisoLONE acetate (PRED FORTE) 1 % ophthalmic suspension Place 1 drop into the right eye daily. 12/03/23  Yes   rosuvastatin (CRESTOR) 5 MG tablet Take 5 mg by mouth daily. 09/14/18  Yes [provider]  TALTZ 80 MG/ML SOAJ Inject 80 mg into the skin every 28 (twenty-eight) days. 03/10/20  Yes [provider]  trimethoprim-polymyxin b (POLYTRIM) ophthalmic solution Place 1 drop into the right eye 4 (four) times daily. 12/03/23  Yes Galen Manila, MD  verapamil (CALAN-SR)  240 MG CR tablet TAKE 1 TABLET BY MOUTH AT BEDTIME Patient taking differently: Take 240 mg by mouth daily. 11/25/15  Yes Corky Crafts, MD  clotrimazole (MYCELEX) 10 MG troche Dissolve 1 troche (tablet) (10 mg total) by mouth 4 to 5 (five) times daily until gone. Patient not taking: Reported on 12/11/2023 06/07/22     ketorolac (ACULAR) 0.5 % ophthalmic solution Place 1 drop into the right eye in the morning, at noon, in the evening, and at bedtime. 12/03/23     Multiple Vitamin (MULTIVITAMIN WITH MINERALS) TABS tablet Take 1  tablet by mouth daily. Patient not taking: Reported on 12/11/2023 05/25/18   Osvaldo Shipper, MD  mupirocin ointment (BACTROBAN) 2 % Apply 1 application topically 2 (two) times daily. Patient not taking: Reported on 12/11/2023 09/13/18   Vivi Barrack, DPM    Allergies as of 11/20/2023 - Review Complete 10/12/2023  Allergen Reaction Noted   Cephalosporins Hives 03/07/2020   Apremilast Diarrhea 03/07/2020   Pneumococcal vac polyvalent  11/27/2022   Synjardy [empagliflozin-metformin hcl] Other (See Comments) 09/07/2022   Trulicity [dulaglutide] Diarrhea and Nausea And Vomiting 05/20/2018   Ciprofloxacin Rash 04/22/2018   Keflex [cephalexin] Rash 04/22/2018   Latex Rash 04/15/2014    Family History  Problem Relation Age of Onset   Hypertension Mother    Diabetes Mother    Asthma Mother    CAD Father    Diabetes Sister    Diabetes Brother    Hypertension Brother     Social History   Socioeconomic History   Marital status: Married    Spouse name: Not on file   Number of children: Not on file   Years of education: Not on file   Highest education level: Not on file  Occupational History   Not on file  Tobacco Use   Smoking status: Never   Smokeless tobacco: Never  Vaping Use   Vaping status: Never Used  Substance and Sexual Activity   Alcohol use: Not Currently   Drug use: Never   Sexual activity: Yes  Other Topics Concern   Not on file  Social History Narrative   Not on file   Social Drivers of Health   Financial Resource Strain: Not on file  Food Insecurity: No Food Insecurity (07/12/2023)   Hunger Vital Sign    Worried About Running Out of Food in the Last Year: Never true    Ran Out of Food in the Last Year: Never true  Transportation Needs: No Transportation Needs (07/12/2023)   PRAPARE - Administrator, Civil Service (Medical): No    Lack of Transportation (Non-Medical): No  Physical Activity: Not on file  Stress: Not on file  Social  Connections: Unknown (04/19/2022)   Received from [facility], [facility]   Social Network    Social Network: Not on file  Intimate Partner Violence: Not At Risk (07/12/2023)   Humiliation, Afraid, Rape, and Kick questionnaire    Fear of Current or Ex-Partner: No    Emotionally Abused: No    Physically Abused: No    Sexually Abused: No    Review of Systems: See HPI, otherwise negative ROS  Physical Exam: BP (!) 153/92   Pulse (!) 101   Temp (!) 97.1 F (36.2 C) (Temporal)   Resp 15   Ht 5\' 7"  (1.702 m)   Wt 112.5 kg   LMP 05/13/2018 Comment: tubal ligation  SpO2 98%   BMI 38.84 kg/m  General:   Alert,  cooperative. Head:  Normocephalic and atraumatic. Respiratory:  Normal work of breathing. Cardiovascular:  NAD  Impression/Plan: [name] is here for cataract surgery.  Risks, benefits, limitations, and alternatives regarding cataract surgery have been reviewed with the patient.  Questions have been answered.  All parties agreeable.   Galen Manila, MD  12/25/2023, 11:29 AM

## 2024-01-11 ENCOUNTER — Ambulatory Visit: Payer: No Typology Code available for payment source | Admitting: Podiatry

## 2024-01-15 ENCOUNTER — Encounter: Payer: Self-pay | Admitting: Ophthalmology

## 2024-01-15 NOTE — Anesthesia Preprocedure Evaluation (Addendum)
 Anesthesia Evaluation  Patient identified by MRN, date of birth, ID band Patient awake    Reviewed: Allergy & Precautions, H&P , NPO status , Patient's Chart, lab work & pertinent test results  Airway Mallampati: III  TM Distance: >3 FB     Dental no notable dental hx.    Pulmonary neg pulmonary ROS          Cardiovascular Normal cardiovascular exam+ dysrhythmias Atrial Fibrillation   Patient had echo 04-03-13, but I can't find any report on Epic, of what it showed   Neuro/Psych  Headaches  Neuromuscular disease  negative psych ROS   GI/Hepatic Neg liver ROS,GERD  ,,  Endo/Other  diabetes    Renal/GU Renal disease  negative genitourinary   Musculoskeletal  (+) Arthritis ,    Abdominal  (+) + obese  Peds  (+) Delivery details - Hematology negative hematology ROS (+)   Anesthesia Other Findings Previous cataract surgery 12-25-23 Dr. Aldo Amble   Atrial fibrillation  Psoriasis Psoriatic arthritis  Polycystic ovarian syndrome Type II diabetes mellitus  SVT (supraventricular tachycardia)  Diabetic retinopathy Diabetic ulcer of right great toe    Reproductive/Obstetrics negative OB ROS                             Anesthesia Physical Anesthesia Plan  ASA: 3  Anesthesia Plan: MAC   Post-op Pain Management:    Induction: Intravenous  PONV Risk Score and Plan:   Airway Management Planned: Natural Airway and Nasal Cannula  Additional Equipment:   Intra-op Plan:   Post-operative Plan:   Informed Consent: I have reviewed the patients History and Physical, chart, labs and discussed the procedure including the risks, benefits and alternatives for the proposed anesthesia with the patient or authorized representative  who has indicated his/her understanding and acceptance.       Plan Discussed with: Anesthesiologist, CRNA and Surgeon  Anesthesia Plan Comments:         Anesthesia Quick Evaluation

## 2024-01-18 ENCOUNTER — Ambulatory Visit (INDEPENDENT_AMBULATORY_CARE_PROVIDER_SITE_OTHER): Admitting: Podiatry

## 2024-01-18 ENCOUNTER — Encounter: Payer: Self-pay | Admitting: Podiatry

## 2024-01-18 DIAGNOSIS — M79675 Pain in left toe(s): Secondary | ICD-10-CM

## 2024-01-18 DIAGNOSIS — E1149 Type 2 diabetes mellitus with other diabetic neurological complication: Secondary | ICD-10-CM

## 2024-01-18 DIAGNOSIS — M79674 Pain in right toe(s): Secondary | ICD-10-CM

## 2024-01-18 DIAGNOSIS — B351 Tinea unguium: Secondary | ICD-10-CM

## 2024-01-18 NOTE — Progress Notes (Signed)
 Subjective: Chief Complaint  Patient presents with   [facility]    RM#11 [facility] patient has no concerns today.    53 year old female presents to the office for diabetic foot exam.  States that she has been doing well.  She is not seeing any open lesions, ulcers.  Her nails needs to be trimmed as they are thick and elongated.  She states her blood sugars been doing better.  No recent injuries or changes otherwise.   Objective: AAO x3, NAD DP/PT pulses palpable bilaterally, CRT less than 3 seconds Sensation decreased Nails are hypertrophic, dystrophic, brittle, discolored, elongated 10. No surrounding redness or drainage. Tenderness nails 2-5 on the right and 1-5 on the left. No open lesions or pre-ulcerative lesions are identified today.  Right hallux amputation  Dry skin present without any skin fissures and there are no open wounds present bilaterally.  No pain with calf compression, swelling, warmth, erythema  Assessment: 53 year old female with symptomatic onychomycosis, diabetic foot exam  Plan: -All treatment options discussed with the patient including all alternatives, risks, complications.  -Separate debrided nails x 10 without any complications or bleeding -Continue moisturizer daily -Daily foot inspection, glucose control. -Patient encouraged to call the office with any questions, concerns, change in symptoms.   Charity Conch DPM

## 2024-01-18 NOTE — Discharge Instructions (Signed)

## 2024-01-22 ENCOUNTER — Encounter: Payer: Self-pay | Admitting: Ophthalmology

## 2024-01-22 ENCOUNTER — Encounter
Admission: RE | Disposition: A | Discharge status: Home / Self Care | Payer: Self-pay | Source: Home / Self Care | Attending: Ophthalmology

## 2024-01-22 ENCOUNTER — Ambulatory Visit: Payer: Self-pay | Admitting: Anesthesiology

## 2024-01-22 ENCOUNTER — Other Ambulatory Visit: Payer: Self-pay

## 2024-01-22 ENCOUNTER — Ambulatory Visit
Admission: RE | Admit: 2024-01-22 | Discharge: 2024-01-22 | Disposition: A | Discharge status: Home / Self Care | Attending: Ophthalmology | Admitting: Ophthalmology

## 2024-01-22 DIAGNOSIS — H2511 Age-related nuclear cataract, right eye: Secondary | ICD-10-CM | POA: Diagnosis not present

## 2024-01-22 DIAGNOSIS — Z833 Family history of diabetes mellitus: Secondary | ICD-10-CM | POA: Diagnosis not present

## 2024-01-22 DIAGNOSIS — G709 Myoneural disorder, unspecified: Secondary | ICD-10-CM | POA: Diagnosis not present

## 2024-01-22 DIAGNOSIS — E1136 Type 2 diabetes mellitus with diabetic cataract: Secondary | ICD-10-CM | POA: Insufficient documentation

## 2024-01-22 DIAGNOSIS — N289 Disorder of kidney and ureter, unspecified: Secondary | ICD-10-CM | POA: Insufficient documentation

## 2024-01-22 DIAGNOSIS — K219 Gastro-esophageal reflux disease without esophagitis: Secondary | ICD-10-CM | POA: Insufficient documentation

## 2024-01-22 DIAGNOSIS — L4050 Arthropathic psoriasis, unspecified: Secondary | ICD-10-CM | POA: Insufficient documentation

## 2024-01-22 DIAGNOSIS — I4891 Unspecified atrial fibrillation: Secondary | ICD-10-CM | POA: Diagnosis not present

## 2024-01-22 HISTORY — PX: CATARACT EXTRACTION W/PHACO: SHX586

## 2024-01-22 LAB — GLUCOSE, CAPILLARY: Glucose-Capillary: 118 mg/dL — ABNORMAL HIGH (ref 70–99)

## 2024-01-22 SURGERY — PHACOEMULSIFICATION, CATARACT, WITH IOL INSERTION
Anesthesia: Monitor Anesthesia Care | Laterality: Right

## 2024-01-22 MED ORDER — SIGHTPATH DOSE#1 NA CHONDROIT SULF-NA HYALURON 40-17 MG/ML IO SOLN
INTRAOCULAR | Status: DC | PRN
  Administered 2024-01-22: 1 mL via INTRAOCULAR

## 2024-01-22 MED ORDER — LIDOCAINE HCL (PF) 2 % IJ SOLN
INTRAOCULAR | Status: DC | PRN
  Administered 2024-01-22: 2 mL

## 2024-01-22 MED ORDER — MOXIFLOXACIN HCL 0.5 % OP SOLN
OPHTHALMIC | Status: DC | PRN
  Administered 2024-01-22: .2 mL via OPHTHALMIC

## 2024-01-22 MED ORDER — MIDAZOLAM HCL 2 MG/2ML IJ SOLN
INTRAMUSCULAR | Status: AC
  Filled 2024-01-22: qty 2

## 2024-01-22 MED ORDER — TETRACAINE HCL 0.5 % OP SOLN
OPHTHALMIC | Status: AC
  Filled 2024-01-22: qty 4

## 2024-01-22 MED ORDER — BRIMONIDINE TARTRATE-TIMOLOL 0.2-0.5 % OP SOLN
OPHTHALMIC | Status: DC | PRN
  Administered 2024-01-22: 1 [drp] via OPHTHALMIC

## 2024-01-22 MED ORDER — TETRACAINE HCL 0.5 % OP SOLN
1.0000 [drp] | OPHTHALMIC | Status: DC | PRN
  Administered 2024-01-22 (×3): 1 [drp] via OPHTHALMIC

## 2024-01-22 MED ORDER — SIGHTPATH DOSE#1 BSS IO SOLN
INTRAOCULAR | Status: DC | PRN
  Administered 2024-01-22: 15 mL via INTRAOCULAR

## 2024-01-22 MED ORDER — FENTANYL CITRATE (PF) 100 MCG/2ML IJ SOLN
INTRAMUSCULAR | Status: DC | PRN
  Administered 2024-01-22: 50 ug via INTRAVENOUS

## 2024-01-22 MED ORDER — ARMC OPHTHALMIC DILATING DROPS
OPHTHALMIC | Status: AC
  Filled 2024-01-22: qty 0.5

## 2024-01-22 MED ORDER — ARMC OPHTHALMIC DILATING DROPS
1.0000 | OPHTHALMIC | Status: DC | PRN
  Administered 2024-01-22 (×3): 1 via OPHTHALMIC

## 2024-01-22 MED ORDER — SIGHTPATH DOSE#1 BSS IO SOLN
INTRAOCULAR | Status: DC | PRN
  Administered 2024-01-22: 44 mL via OPHTHALMIC

## 2024-01-22 MED ORDER — MIDAZOLAM HCL 2 MG/2ML IJ SOLN
INTRAMUSCULAR | Status: DC | PRN
  Administered 2024-01-22: 2 mg via INTRAVENOUS

## 2024-01-22 MED ORDER — FENTANYL CITRATE (PF) 100 MCG/2ML IJ SOLN
INTRAMUSCULAR | Status: AC
  Filled 2024-01-22: qty 2

## 2024-01-22 SURGICAL SUPPLY — 12 items
CATARACT SUITE SIGHTPATH (MISCELLANEOUS) ×1 IMPLANT
CYSTOTOME ANGL RVRS SHRT 25G (CUTTER) ×1 IMPLANT
CYSTOTOME ANGL RVRS SHRT 25GA (CUTTER) ×1 IMPLANT
FEE CATARACT SUITE SIGHTPATH (MISCELLANEOUS) ×1 IMPLANT
GLOVE BIOGEL PI IND STRL 8 (GLOVE) ×1 IMPLANT
GLOVE SURG LX STRL 8.0 MICRO (GLOVE) ×1 IMPLANT
GLOVE SURG PROTEXIS BL SZ6.5 (GLOVE) ×1 IMPLANT
GLOVE SURG SYN 6.5 PF PI BL (GLOVE) ×1 IMPLANT
LENS IOL TECNIS EYHANCE 22.5 (Intraocular Lens) IMPLANT
NDL FILTER BLUNT 18X1 1/2 (NEEDLE) ×1 IMPLANT
NEEDLE FILTER BLUNT 18X1 1/2 (NEEDLE) ×1 IMPLANT
SYR 3ML LL SCALE MARK (SYRINGE) ×1 IMPLANT

## 2024-01-22 NOTE — Anesthesia Postprocedure Evaluation (Signed)
 Anesthesia Post Note  Patient: [name]  Procedure(s) Performed: PHACOEMULSIFICATION, CATARACT, WITH IOL INSERTION 4.83 00:32.1 (Right)  Patient location during evaluation: PACU Anesthesia Type: MAC Level of consciousness: awake and alert Pain management: pain level controlled Vital Signs Assessment: post-procedure vital signs reviewed and stable Respiratory status: spontaneous breathing, nonlabored ventilation and respiratory function stable Cardiovascular status: stable and blood pressure returned to baseline Postop Assessment: no apparent nausea or vomiting Anesthetic complications: no   No notable events documented.   Last Vitals:  Vitals:   01/22/24 0746 01/22/24 0751  BP: 126/79 124/64  Pulse: 87 87  Resp: 18 17  Temp: (!) 36.4 C (!) 36.1 C  SpO2: 98% 98%    Last Pain:  Vitals:   01/22/24 0751  PainSc: 0-No pain                 Baltazar Bonier

## 2024-01-22 NOTE — Op Note (Signed)
 PREOPERATIVE DIAGNOSIS:  Nuclear sclerotic cataract of the right eye.   POSTOPERATIVE DIAGNOSIS:  Right Eye Cataract   OPERATIVE PROCEDURE:ORPROCALL@   SURGEON:  Clair Crews, MD.   ANESTHESIA:  Anesthesiologist: Baltazar Bonier, MD CRNA: Sherrlyn Dolores, CRNA  1.      Managed anesthesia care. 2.      0.12ml of Shugarcaine was instilled in the eye following the paracentesis.   COMPLICATIONS:  None.   TECHNIQUE:   Stop and chop   DESCRIPTION OF PROCEDURE:  The patient was examined and consented in the preoperative holding area where the aforementioned topical anesthesia was applied to the right eye and then brought back to the Operating Room where the right eye was prepped and draped in the usual sterile ophthalmic fashion and a lid speculum was placed. A paracentesis was created with the side port blade and the anterior chamber was filled with viscoelastic. A near clear corneal incision was performed with the steel keratome. A continuous curvilinear capsulorrhexis was performed with a cystotome followed by the capsulorrhexis forceps. Hydrodissection and hydrodelineation were carried out with BSS on a blunt cannula. The lens was removed in a stop and chop  technique and the remaining cortical material was removed with the irrigation-aspiration handpiece. The capsular bag was inflated with viscoelastic and the Technis [id number]  lens was placed in the capsular bag without complication. The remaining viscoelastic was removed from the eye with the irrigation-aspiration handpiece. The wounds were hydrated. The anterior chamber was flushed with BSS and the eye was inflated to physiologic pressure. 0.1ml of Vigamox was placed in the anterior chamber. The wounds were found to be water tight. The eye was dressed with Combigan. The patient was given protective glasses to wear throughout the day and a shield with which to sleep tonight. The patient was also given drops with which to begin a drop  regimen today and will follow-up with me in one day. Implant Name Type Inv. Item Serial No. Manufacturer Lot No. LRB No. Used Action  LENS IOL TECNIS EYHANCE 22.5 - [id number] Intraocular Lens LENS IOL TECNIS EYHANCE 22.5 [id number] [employer]  Right 1 Implanted   Procedure(s): PHACOEMULSIFICATION, CATARACT, WITH IOL INSERTION 4.83 00:32.1 (Right)  Electronically signed: Clair Crews 01/22/2024 7:44 AM

## 2024-01-22 NOTE — Transfer of Care (Signed)
 Immediate Anesthesia Transfer of Care Note  Patient: [name]  Procedure(s) Performed: PHACOEMULSIFICATION, CATARACT, WITH IOL INSERTION 4.83 00:32.1 (Right)  Patient Location: PACU  Anesthesia Type: MAC  Level of Consciousness: awake, alert  and patient cooperative  Airway and Oxygen Therapy: Patient Spontanous Breathing and Patient connected to supplemental oxygen  Post-op Assessment: Post-op Vital signs reviewed, Patient's Cardiovascular Status Stable, Respiratory Function Stable, Patent Airway and No signs of Nausea or vomiting  Post-op Vital Signs: Reviewed and stable  Complications: No notable events documented.

## 2024-01-22 NOTE — H&P (Signed)
 [facility]   Primary Care Physician:  Tena Feeling, MD Ophthalmologist: Dr. Merrell Abate  Pre-Procedure History & Physical: HPI:  [name] is a 53 y.o. female here for cataract surgery.   Past Medical History:  Diagnosis Date   Atrial fibrillation (HCC)    Diabetic retinopathy (HCC)    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)    "been dealing w/it for ~ 1 yr" (05/20/2018)   Polycystic ovarian syndrome    Psoriasis    Psoriatic arthritis (HCC)    "pretty much all over; knees, hands, elbows" (05/20/2018)   SVT (supraventricular tachycardia) (HCC)    "had 1 chemical conversion here in the ER" (05/20/2018)   Type II diabetes mellitus (HCC)     Past Surgical History:  Procedure Laterality Date   AMPUTATION TOE Right 05/22/2018   Procedure: AMPUTATION RIGHT TOE;  Surgeon: Charity Conch, DPM;  Location: [facility] OR;  Service: Podiatry;  Laterality: Right;   CESAREAN SECTION  2005   FOOT FRACTURE SURGERY Left    pin put in by Dr. Clydia Dart   FRACTURE SURGERY     TUBAL LIGATION  2005    Prior to Admission medications   Medication Sig Start Date End Date Taking? Authorizing Provider  acetaminophen  (TYLENOL ) 500 MG tablet Take 1,000 mg by mouth every 6 (six) hours as needed for mild pain or headache.   Yes [provider]  albuterol  (VENTOLIN  HFA) 108 (90 Base) MCG/ACT inhaler Inhale 2 puffs into the lungs every 6 (six) hours as needed for wheezing or shortness of breath. 12/03/18  Yes [provider]  Cholecalciferol (VITAMIN D3) 50 MCG (2000 UT) TABS Take by mouth in the morning and at bedtime.   Yes [provider]  escitalopram  (LEXAPRO ) 20 MG tablet Take 20 mg by mouth daily.   Yes [provider]  estradiol (ESTRACE) 0.1 MG/GM vaginal cream Place 1 Applicatorful vaginally once a week. As needed 10/28/20  Yes [provider]  fluconazole (DIFLUCAN) 150 MG tablet Take 150 mg by mouth as needed (bacterial infection). 12/19/19  Yes  [provider]  HUMULIN  R U-500 KWIKPEN 500 UNIT/ML KwikPen Inject 150-180 Units into the skin in the morning, at noon, and at bedtime. Inject 180 units in morning, 150 units at lunch and dinner   Yes [provider]  ibuprofen  (ADVIL ,MOTRIN ) 200 MG tablet Take 800 mg by mouth daily as needed (pain).   Yes [provider]  meloxicam  (MOBIC ) 15 MG tablet Take 15 mg by mouth daily. 10/12/21  Yes [provider]  norethindrone (AYGESTIN) 5 MG tablet Take 5 mg by mouth daily. 11/10/18  Yes [provider]  nystatin-triamcinolone (MYCOLOG II) cream Apply 1 application topically daily as needed (rash/irritation).  10/01/18  Yes [provider]  omeprazole (PRILOSEC) 20 MG capsule Take 20 mg by mouth every morning. 02/26/20  Yes [provider]  rosuvastatin  (CRESTOR ) 5 MG tablet Take 5 mg by mouth daily. 09/14/18  Yes [provider]  TALTZ 80 MG/ML SOAJ Inject 80 mg into the skin every 28 (twenty-eight) days. 03/10/20  Yes [provider]  verapamil  (CALAN -SR) 240 MG CR tablet TAKE 1 TABLET BY MOUTH AT BEDTIME Patient taking differently: Take 240 mg by mouth daily. 11/25/15  Yes Lucendia Rusk, MD  ketorolac  (ACULAR ) 0.5 % ophthalmic solution Place 1 drop into the right eye in the morning, at noon, in the evening, and at bedtime. 12/03/23     prednisoLONE  acetate (PRED  FORTE) 1 % ophthalmic suspension Place 1 drop into the right eye daily. 12/03/23     trimethoprim -polymyxin b  (POLYTRIM ) ophthalmic solution Place 1 drop into the right eye 4 (four) times daily. 12/03/23   Clair Crews, MD    Allergies as of 01/03/2024 - Review Complete 12/25/2023  Allergen Reaction Noted   Cephalosporins Hives 03/07/2020   Apremilast Diarrhea 03/07/2020   Pneumococcal vac polyvalent  11/27/2022   Synjardy [empagliflozin-metformin hcl] Other (See Comments) 09/07/2022   Trulicity [dulaglutide] Diarrhea and Nausea And Vomiting 05/20/2018    Ciprofloxacin  Rash 04/22/2018   Keflex  [cephalexin ] Rash 04/22/2018   Latex Rash 04/15/2014    Family History  Problem Relation Age of Onset   Hypertension Mother    Diabetes Mother    Asthma Mother    CAD Father    Diabetes Sister    Diabetes Brother    Hypertension Brother     Social History   Socioeconomic History   Marital status: Married    Spouse name: Not on file   Number of children: Not on file   Years of education: Not on file   Highest education level: Not on file  Occupational History   Not on file  Tobacco Use   Smoking status: Never   Smokeless tobacco: Never  Vaping Use   Vaping status: Never Used  Substance and Sexual Activity   Alcohol use: Not Currently   Drug use: Never   Sexual activity: Yes  Other Topics Concern   Not on file  Social History Narrative   Not on file   Social Drivers of Health   Financial Resource Strain: Not on file  Food Insecurity: No Food Insecurity (07/12/2023)   Hunger Vital Sign    Worried About Running Out of Food in the Last Year: Never true    Ran Out of Food in the Last Year: Never true  Transportation Needs: No Transportation Needs (07/12/2023)   PRAPARE - Administrator, Civil Service (Medical): No    Lack of Transportation (Non-Medical): No  Physical Activity: Not on file  Stress: Not on file  Social Connections: Unknown (04/19/2022)   Received from [facility], [facility]   Social Network    Social Network: Not on file  Intimate Partner Violence: Not At Risk (07/12/2023)   Humiliation, Afraid, Rape, and Kick questionnaire    Fear of Current or Ex-Partner: No    Emotionally Abused: No    Physically Abused: No    Sexually Abused: No    Review of Systems: See HPI, otherwise negative ROS  Physical Exam: Ht 5\' 7"  (1.702 m)   Wt 113.4 kg   LMP 05/13/2018 Comment: tubal ligation  BMI 39.16 kg/m  General:   Alert, cooperative. Head:  Normocephalic and atraumatic. Respiratory:   Normal work of breathing. Cardiovascular:  NAD  Impression/Plan: [name] is here for cataract surgery.  Risks, benefits, limitations, and alternatives regarding cataract surgery have been reviewed with the patient.  Questions have been answered.  All parties agreeable.   Clair Crews, MD  01/22/2024, 7:16 AM

## 2024-01-22 NOTE — Addendum Note (Signed)
 Addendum  created 01/22/24 [id number] by Sherrlyn Dolores, CRNA   Intraprocedure Event edited

## 2024-02-06 ENCOUNTER — Other Ambulatory Visit: Payer: Self-pay

## 2024-02-06 MED ORDER — PREDNISOLONE ACETATE 1 % OP SUSP
1.0000 [drp] | Freq: Two times a day (BID) | OPHTHALMIC | 5 refills | Status: DC
  Filled 2024-02-06: qty 10, 100d supply, fill #0

## 2024-02-06 MED ORDER — PREDNISOLONE ACETATE 1 % OP SUSP
1.0000 [drp] | Freq: Every day | OPHTHALMIC | 5 refills | Status: AC
Start: 2024-02-06 — End: ?
  Filled 2024-02-06 – 2024-03-04 (×2): qty 5, 100d supply, fill #0

## 2024-02-18 ENCOUNTER — Other Ambulatory Visit: Payer: Self-pay

## 2024-03-04 ENCOUNTER — Other Ambulatory Visit: Payer: Self-pay

## 2024-03-11 ENCOUNTER — Encounter (HOSPITAL_COMMUNITY): Payer: Self-pay

## 2024-03-11 ENCOUNTER — Emergency Department (HOSPITAL_COMMUNITY)

## 2024-03-11 ENCOUNTER — Other Ambulatory Visit: Payer: Self-pay

## 2024-03-11 ENCOUNTER — Inpatient Hospital Stay (HOSPITAL_COMMUNITY)
Admission: EM | Admit: 2024-03-11 | Discharge: 2024-03-14 | DRG: 389 | Disposition: A | Discharge status: Home / Self Care | Attending: Family Medicine | Admitting: Family Medicine

## 2024-03-11 DIAGNOSIS — L4050 Arthropathic psoriasis, unspecified: Secondary | ICD-10-CM | POA: Diagnosis present

## 2024-03-11 DIAGNOSIS — Z833 Family history of diabetes mellitus: Secondary | ICD-10-CM

## 2024-03-11 DIAGNOSIS — Z91040 Latex allergy status: Secondary | ICD-10-CM

## 2024-03-11 DIAGNOSIS — Z888 Allergy status to other drugs, medicaments and biological substances status: Secondary | ICD-10-CM

## 2024-03-11 DIAGNOSIS — Z1152 Encounter for screening for COVID-19: Secondary | ICD-10-CM

## 2024-03-11 DIAGNOSIS — Z9851 Tubal ligation status: Secondary | ICD-10-CM

## 2024-03-11 DIAGNOSIS — Z794 Long term (current) use of insulin: Secondary | ICD-10-CM | POA: Diagnosis not present

## 2024-03-11 DIAGNOSIS — I4891 Unspecified atrial fibrillation: Secondary | ICD-10-CM | POA: Diagnosis present

## 2024-03-11 DIAGNOSIS — K567 Ileus, unspecified: Principal | ICD-10-CM

## 2024-03-11 DIAGNOSIS — R000 Tachycardia, unspecified: Secondary | ICD-10-CM | POA: Diagnosis not present

## 2024-03-11 DIAGNOSIS — D72829 Elevated white blood cell count, unspecified: Secondary | ICD-10-CM | POA: Diagnosis present

## 2024-03-11 DIAGNOSIS — M7989 Other specified soft tissue disorders: Secondary | ICD-10-CM | POA: Diagnosis not present

## 2024-03-11 DIAGNOSIS — Z6839 Body mass index (BMI) 39.0-39.9, adult: Secondary | ICD-10-CM

## 2024-03-11 DIAGNOSIS — E119 Type 2 diabetes mellitus without complications: Secondary | ICD-10-CM

## 2024-03-11 DIAGNOSIS — Z8249 Family history of ischemic heart disease and other diseases of the circulatory system: Secondary | ICD-10-CM

## 2024-03-11 DIAGNOSIS — J9809 Other diseases of bronchus, not elsewhere classified: Secondary | ICD-10-CM | POA: Diagnosis present

## 2024-03-11 DIAGNOSIS — Z791 Long term (current) use of non-steroidal anti-inflammatories (NSAID): Secondary | ICD-10-CM

## 2024-03-11 DIAGNOSIS — K560 Paralytic ileus: Secondary | ICD-10-CM | POA: Diagnosis present

## 2024-03-11 DIAGNOSIS — R1013 Epigastric pain: Secondary | ICD-10-CM

## 2024-03-11 DIAGNOSIS — K56609 Unspecified intestinal obstruction, unspecified as to partial versus complete obstruction: Secondary | ICD-10-CM | POA: Diagnosis not present

## 2024-03-11 DIAGNOSIS — K56600 Partial intestinal obstruction, unspecified as to cause: Principal | ICD-10-CM | POA: Diagnosis present

## 2024-03-11 DIAGNOSIS — Z881 Allergy status to other antibiotic agents status: Secondary | ICD-10-CM

## 2024-03-11 DIAGNOSIS — I471 Supraventricular tachycardia, unspecified: Secondary | ICD-10-CM | POA: Diagnosis not present

## 2024-03-11 DIAGNOSIS — R911 Solitary pulmonary nodule: Secondary | ICD-10-CM | POA: Diagnosis present

## 2024-03-11 DIAGNOSIS — E669 Obesity, unspecified: Secondary | ICD-10-CM | POA: Diagnosis present

## 2024-03-11 DIAGNOSIS — Z79899 Other long term (current) drug therapy: Secondary | ICD-10-CM

## 2024-03-11 DIAGNOSIS — E11319 Type 2 diabetes mellitus with unspecified diabetic retinopathy without macular edema: Secondary | ICD-10-CM | POA: Diagnosis present

## 2024-03-11 DIAGNOSIS — R112 Nausea with vomiting, unspecified: Secondary | ICD-10-CM

## 2024-03-11 DIAGNOSIS — Z89421 Acquired absence of other right toe(s): Secondary | ICD-10-CM

## 2024-03-11 DIAGNOSIS — Z825 Family history of asthma and other chronic lower respiratory diseases: Secondary | ICD-10-CM

## 2024-03-11 DIAGNOSIS — K219 Gastro-esophageal reflux disease without esophagitis: Secondary | ICD-10-CM | POA: Diagnosis present

## 2024-03-11 DIAGNOSIS — E282 Polycystic ovarian syndrome: Secondary | ICD-10-CM | POA: Diagnosis present

## 2024-03-11 DIAGNOSIS — K589 Irritable bowel syndrome without diarrhea: Secondary | ICD-10-CM | POA: Diagnosis present

## 2024-03-11 LAB — URINALYSIS, ROUTINE W REFLEX MICROSCOPIC
Bilirubin Urine: NEGATIVE
Glucose, UA: 50 mg/dL — AB
Hgb urine dipstick: NEGATIVE
Ketones, ur: NEGATIVE mg/dL
Leukocytes,Ua: NEGATIVE
Nitrite: NEGATIVE
Protein, ur: 30 mg/dL — AB
Specific Gravity, Urine: 1.016 (ref 1.005–1.030)
pH: 5 (ref 5.0–8.0)

## 2024-03-11 LAB — CBC WITH DIFFERENTIAL/PLATELET
Abs Immature Granulocytes: 0.07 10*3/uL (ref 0.00–0.07)
Basophils Absolute: 0 10*3/uL (ref 0.0–0.1)
Basophils Relative: 0 %
Eosinophils Absolute: 0.1 10*3/uL (ref 0.0–0.5)
Eosinophils Relative: 1 %
HCT: 45.3 % (ref 36.0–46.0)
Hemoglobin: 14.7 g/dL (ref 12.0–15.0)
Immature Granulocytes: 1 %
Lymphocytes Relative: 8 %
Lymphs Abs: 1 10*3/uL (ref 0.7–4.0)
MCH: 30 pg (ref 26.0–34.0)
MCHC: 32.5 g/dL (ref 30.0–36.0)
MCV: 92.4 fL (ref 80.0–100.0)
Monocytes Absolute: 0.7 10*3/uL (ref 0.1–1.0)
Monocytes Relative: 6 %
Neutro Abs: 10.1 10*3/uL — ABNORMAL HIGH (ref 1.7–7.7)
Neutrophils Relative %: 84 %
Platelets: 335 10*3/uL (ref 150–400)
RBC: 4.9 MIL/uL (ref 3.87–5.11)
RDW: 14.6 % (ref 11.5–15.5)
WBC: 12 10*3/uL — ABNORMAL HIGH (ref 4.0–10.5)
nRBC: 0 % (ref 0.0–0.2)

## 2024-03-11 LAB — TROPONIN I (HIGH SENSITIVITY)
Troponin I (High Sensitivity): 7 ng/L (ref <18)
Troponin I (High Sensitivity): 4 ng/L (ref <18)

## 2024-03-11 LAB — BRAIN NATRIURETIC PEPTIDE: B Natriuretic Peptide: 39.4 pg/mL (ref 0.0–100.0)

## 2024-03-11 LAB — COMPREHENSIVE METABOLIC PANEL WITH GFR
ALT: 22 U/L (ref 0–44)
AST: 24 U/L (ref 15–41)
Albumin: 3.5 g/dL (ref 3.5–5.0)
Alkaline Phosphatase: 57 U/L (ref 38–126)
Anion gap: 12 (ref 5–15)
BUN: 9 mg/dL (ref 6–20)
CO2: 24 mmol/L (ref 22–32)
Calcium: 9.3 mg/dL (ref 8.9–10.3)
Chloride: 100 mmol/L (ref 98–111)
Creatinine, Ser: 0.54 mg/dL (ref 0.44–1.00)
GFR, Estimated: >60 mL/min (ref >60)
Glucose, Bld: 170 mg/dL — ABNORMAL HIGH (ref 70–99)
Potassium: 3.6 mmol/L (ref 3.5–5.1)
Sodium: 136 mmol/L (ref 135–145)
Total Bilirubin: 0.6 mg/dL (ref 0.0–1.2)
Total Protein: 7.9 g/dL (ref 6.5–8.1)

## 2024-03-11 LAB — T4, FREE: Free T4: 0.88 ng/dL (ref 0.61–1.12)

## 2024-03-11 LAB — RESP PANEL BY RT-PCR (RSV, FLU A&B, COVID)  RVPGX2
Influenza A by PCR: NEGATIVE
Influenza B by PCR: NEGATIVE
Resp Syncytial Virus by PCR: NEGATIVE
SARS Coronavirus 2 by RT PCR: NEGATIVE

## 2024-03-11 LAB — LIPASE, BLOOD: Lipase: 21 U/L (ref 11–51)

## 2024-03-11 LAB — TSH: TSH: 4.391 u[IU]/mL (ref 0.350–4.500)

## 2024-03-11 LAB — CBG MONITORING, ED: Glucose-Capillary: 116 mg/dL — ABNORMAL HIGH (ref 70–99)

## 2024-03-11 MED ORDER — INSULIN ASPART 100 UNIT/ML IJ SOLN
0.0000 [IU] | INTRAMUSCULAR | Status: DC
  Administered 2024-03-12: 2 [IU] via SUBCUTANEOUS
  Administered 2024-03-12 (×3): 3 [IU] via SUBCUTANEOUS

## 2024-03-11 MED ORDER — HEPARIN SODIUM (PORCINE) 5000 UNIT/ML IJ SOLN
5000.0000 [IU] | Freq: Three times a day (TID) | INTRAMUSCULAR | Status: DC
  Administered 2024-03-12 – 2024-03-14 (×8): 5000 [IU] via SUBCUTANEOUS
  Filled 2024-03-11 (×7): qty 1

## 2024-03-11 MED ORDER — PROCHLORPERAZINE EDISYLATE 10 MG/2ML IJ SOLN
5.0000 mg | Freq: Once | INTRAMUSCULAR | Status: AC
  Administered 2024-03-11: 5 mg via INTRAVENOUS
  Filled 2024-03-11: qty 2

## 2024-03-11 MED ORDER — MORPHINE SULFATE (PF) 4 MG/ML IV SOLN
4.0000 mg | Freq: Once | INTRAVENOUS | Status: AC
  Administered 2024-03-11: 4 mg via INTRAVENOUS
  Filled 2024-03-11: qty 1

## 2024-03-11 MED ORDER — IOHEXOL 350 MG/ML SOLN
75.0000 mL | Freq: Once | INTRAVENOUS | Status: AC | PRN
  Administered 2024-03-11: 75 mL via INTRAVENOUS

## 2024-03-11 MED ORDER — SODIUM CHLORIDE 0.9 % IV BOLUS
1000.0000 mL | Freq: Once | INTRAVENOUS | Status: AC
  Administered 2024-03-11: 1000 mL via INTRAVENOUS

## 2024-03-11 MED ORDER — LACTATED RINGERS IV SOLN: INTRAVENOUS | Status: DC

## 2024-03-11 MED ORDER — VERAPAMIL HCL ER 240 MG PO TBCR
240.0000 mg | EXTENDED_RELEASE_TABLET | Freq: Every day | ORAL | Status: DC
  Administered 2024-03-13 – 2024-03-14 (×2): 240 mg via ORAL
  Filled 2024-03-11 (×3): qty 1

## 2024-03-11 MED ORDER — DIPHENHYDRAMINE HCL 50 MG/ML IJ SOLN
25.0000 mg | Freq: Once | INTRAMUSCULAR | Status: AC
  Administered 2024-03-11: 25 mg via INTRAVENOUS
  Filled 2024-03-11: qty 1

## 2024-03-11 MED ORDER — PANTOPRAZOLE SODIUM 40 MG IV SOLR
40.0000 mg | Freq: Once | INTRAVENOUS | Status: AC
  Administered 2024-03-11: 40 mg via INTRAVENOUS
  Filled 2024-03-11: qty 10

## 2024-03-11 MED ORDER — ONDANSETRON HCL 4 MG/2ML IJ SOLN
4.0000 mg | Freq: Once | INTRAMUSCULAR | Status: AC
  Administered 2024-03-11: 4 mg via INTRAVENOUS
  Filled 2024-03-11: qty 2

## 2024-03-11 NOTE — ED Provider Triage Note (Signed)
 Emergency Medicine Provider Triage Evaluation Note  Shaunita Seney , a 53 y.o. female  was evaluated in triage.  Pt complains of chest pain, shortness of breath.  Pain radiates to the left shoulder.  History of A-fib and SVT.  Not on anticoagulation.  Review of Systems  Positive: As above Negative: As above  Physical Exam  BP (!) 107/97 (BP Location: Left Arm)   Pulse (!) 120   Temp 99.1 F (37.3 C)   Resp 18   Ht 5' 7 (1.702 m)   Wt 113.4 kg   LMP 05/13/2018 Comment: tubal ligation  SpO2 97%   BMI 39.16 kg/m  Gen:   Awake, no distress   Resp:  Normal effort  MSK:   Moves extremities without difficulty  Other:    Medical Decision Making  Medically screening exam initiated at 3:37 PM.  Appropriate orders placed.  Willene Holian was informed that the remainder of the evaluation will be completed by another provider, this initial triage assessment does not replace that evaluation, and the importance of remaining in the ED until their evaluation is complete.  Asymmetrical swelling to lower extremities worse on the right.  Tachycardic to about 130 in triage.  Notified charge nurse that patient needs a room soon. PE study ordered as well.   Hildegard Loge, PA-C 03/11/24 [id number]

## 2024-03-11 NOTE — ED Notes (Signed)
 Transported to CT

## 2024-03-11 NOTE — H&P (Signed)
 History and Physical    [name] FMW:[id number] DOB: [birth year] DOA: 03/11/2024  Patient coming from: Home.  Chief Complaint: Nausea vomiting and chest pain.  HPI: [name] is a 53 y.o. female with history of SVT, diabetes mellitus type 2, psoriatic arthritis presents to the ER with complaints of nausea vomiting chest pain and diarrhea.  Patient's symptoms started this morning.  Pain was in left side of the chest and subsequently started having multiple episodes of vomiting and also had watery diarrhea.  Denies any recent use of antibiotics.  Patient did have surgery for the right eye 3 weeks ago.  Patient subsequently started having abdominal discomfort.  ED Course: In the ER patient was tachycardic.  CT chest abdomen pelvis shows features concerning for possible small bowel obstruction versus ileus.  General surgery was notified.  Labs show creatinine of 0.5 WBC 12 UA unremarkable troponin 7 EKG shows sinus tachycardia.  Patient was given fluids and admitted for further observation.  Review of Systems: As per HPI, rest all negative.   Past Medical History:  Diagnosis Date   Atrial fibrillation (HCC)    Diabetic retinopathy (HCC)    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)    been dealing w/it for ~ 1 yr (05/20/2018)   Polycystic ovarian syndrome    Psoriasis    Psoriatic arthritis (HCC)    pretty much all over; knees, hands, elbows (05/20/2018)   SVT (supraventricular tachycardia) (HCC)    had 1 chemical conversion here in the ER (05/20/2018)   Type II diabetes mellitus (HCC)     Past Surgical History:  Procedure Laterality Date   AMPUTATION TOE Right 05/22/2018   Procedure: AMPUTATION RIGHT TOE;  Surgeon: Gershon Donnice SAUNDERS, DPM;  Location: [facility] OR;  Service: Podiatry;  Laterality: Right;   CATARACT EXTRACTION W/PHACO Right 01/22/2024   Procedure: PHACOEMULSIFICATION, CATARACT, WITH IOL INSERTION 4.83 00:32.1;  Surgeon: Jaye Fallow, MD;  Location: [facility];  Service: Ophthalmology;  Laterality: Right;   CESAREAN SECTION  2005   FOOT FRACTURE SURGERY Left    pin put in by Dr. Gershon   FRACTURE SURGERY     TUBAL LIGATION  2005     reports that she has never smoked. She has never used smokeless tobacco. She reports that she does not currently use alcohol. She reports that she does not use drugs.  Allergies  Allergen Reactions   Cephalosporins Hives   Apremilast Diarrhea   Ozempic (0.25 Or 0.5 Mg-Dose) [Semaglutide(0.25 Or 0.5mg -Dos)] Diarrhea and Nausea And Vomiting    Also, pancreatitis   Pneumococcal Vac Polyvalent     Other Reaction(s): swelling, itching, and redness at site of intradermal Pneumovax injection   Synjardy [Empagliflozin-Metformin Hcl] Other (See Comments)    Yeast Infection   Trulicity [Dulaglutide] Diarrhea and Nausea And Vomiting   Ciprofloxacin  Rash   Keflex  [Cephalexin ] Rash   Latex Rash    Exam gloves when worn and when touched by care givers    Family History  Problem Relation Age of Onset   Hypertension Mother    Diabetes Mother    Asthma Mother    CAD Father    Diabetes Sister    Diabetes Brother    Hypertension Brother     Prior to Admission medications   Medication Sig Start Date End Date Taking? Authorizing Provider  acetaminophen  (TYLENOL ) 500 MG tablet Take 1,000 mg by mouth every 6 (six) hours as needed for mild pain or headache.   Yes  [provider]  albuterol  (VENTOLIN  HFA) 108 (90 Base) MCG/ACT inhaler Inhale 2 puffs into the lungs every 6 (six) hours as needed for wheezing or shortness of breath. 12/03/18  Yes [provider]  Cholecalciferol (VITAMIN D3) 50 MCG (2000 UT) TABS Take 2,000 Units by mouth daily.   Yes [provider]  escitalopram  (LEXAPRO ) 20 MG tablet Take 20 mg by mouth daily.   Yes [provider]  estradiol (ESTRACE) 0.1 MG/GM vaginal cream Place 1 Applicatorful vaginally once a week. As needed 10/28/20  Yes [provider]  HUMULIN  R U-500 KWIKPEN 500 UNIT/ML KwikPen Inject 150-180 Units into the skin in the morning, at noon, and at bedtime. Inject 180 units in morning, 150 units at lunch and dinner   Yes [provider]  ibuprofen  (ADVIL ,MOTRIN ) 200 MG tablet Take 800 mg by mouth daily as needed (pain).   Yes [provider]  meloxicam  (MOBIC ) 15 MG tablet Take 15 mg by mouth daily. 10/12/21  Yes [provider]  norethindrone (AYGESTIN) 5 MG tablet Take 5 mg by mouth daily. 11/10/18  Yes [provider]  omeprazole (PRILOSEC) 20 MG capsule Take 20 mg by mouth every morning. 02/26/20  Yes [provider]  prednisoLONE  acetate (PRED FORTE ) 1 % ophthalmic suspension Apply INSTILL ONE DROP INTO THE RIGHT EYE ONCE A DAY 02/06/24  Yes   rosuvastatin  (CRESTOR ) 5 MG tablet Take 5 mg by mouth daily. 09/14/18  Yes [provider]  TALTZ 80 MG/ML SOAJ Inject 80 mg into the skin every 28 (twenty-eight) days. 03/10/20  Yes [provider]  verapamil  (CALAN -SR) 240 MG CR tablet TAKE 1 TABLET BY MOUTH AT BEDTIME Patient taking differently: Take 240 mg by mouth daily. 11/25/15  Yes Terri Candyce RAMAN, MD  fluconazole (DIFLUCAN) 150 MG tablet Take 150 mg by mouth as needed (bacterial infection). Patient not taking: Reported on 03/11/2024 12/19/19   [provider]  nystatin-triamcinolone (MYCOLOG II) cream Apply 1 application topically daily as needed (rash/irritation).  10/01/18   [provider]  prednisoLONE  acetate (PRED FORTE ) 1 % ophthalmic suspension Place 1 drop into the right eye daily. Patient not taking: Reported on 03/11/2024 12/03/23     prednisoLONE  acetate (PRED FORTE ) 1 % ophthalmic suspension Place 1 drop into the right eye 2 (two) times daily. Patient not taking: Reported on 03/11/2024 02/06/24       Physical Exam: Constitutional: Moderately built and nourished. Vitals:   03/11/24 1715 03/11/24 1900 03/11/24 1915 03/11/24 1947   BP:   (!) 172/92 (!) 177/81  Pulse: (!) 119 (!) 116 (!) 117 (!) 116  Resp:   (!) 21 20  Temp:    98.6 F (37 C)  SpO2: 95% 99% (!) 85% 97%  Weight:      Height:       Eyes: Anicteric no pallor. ENMT: No discharge from the ears eyes nose normal. Neck: No mass felt.  No neck rigidity. Respiratory: No rhonchi or crepitations. Cardiovascular: S1-S2 heard. Abdomen: Soft nontender bowel sound present. Musculoskeletal: No edema. Skin: No rash. Neurologic: Alert awake oriented time place and person.  Moves all extremities. Psychiatric: Appears normal.  Normal affect.   Labs on Admission: I have personally reviewed following labs and imaging studies  CBC: Recent Labs  Lab 03/11/24 1542  WBC 12.0*  NEUTROABS 10.1*  HGB 14.7  HCT 45.3  MCV 92.4  PLT 335   Basic Metabolic Panel: Recent Labs  Lab 03/11/24 1542  NA 136  K 3.6  CL 100  CO2 24  GLUCOSE 170*  BUN 9  CREATININE 0.54  CALCIUM  9.3   GFR: Estimated Creatinine Clearance: 105.7 mL/min (by C-G formula based on SCr of 0.54 mg/dL). Liver Function Tests: Recent Labs  Lab 03/11/24 1542  AST 24  ALT 22  ALKPHOS 57  BILITOT 0.6  PROT 7.9  ALBUMIN 3.5   Recent Labs  Lab 03/11/24 1754  LIPASE 21   No results for input(s): AMMONIA in the last 168 hours. Coagulation Profile: No results for input(s): INR, PROTIME in the last 168 hours. Cardiac Enzymes: No results for input(s): CKTOTAL, CKMB, CKMBINDEX, TROPONINI in the last 168 hours. BNP (last 3 results) No results for input(s): PROBNP in the last 8760 hours. HbA1C: No results for input(s): HGBA1C in the last 72 hours. CBG: No results for input(s): GLUCAP in the last 168 hours. Lipid Profile: No results for input(s): CHOL, HDL, LDLCALC, TRIG, CHOLHDL, LDLDIRECT in the last 72 hours. Thyroid Function Tests: No results for input(s): TSH, T4TOTAL, FREET4, T3FREE, THYROIDAB in the last 72 hours. Anemia Panel: No  results for input(s): VITAMINB12, FOLATE, FERRITIN, TIBC, IRON, RETICCTPCT in the last 72 hours. Urine analysis:    Component Value Date/Time   COLORURINE YELLOW 03/11/2024 1708   APPEARANCEUR HAZY (A) 03/11/2024 1708   LABSPEC 1.016 03/11/2024 1708   PHURINE 5.0 03/11/2024 1708   GLUCOSEU 50 (A) 03/11/2024 1708   HGBUR NEGATIVE 03/11/2024 1708   BILIRUBINUR NEGATIVE 03/11/2024 1708   KETONESUR NEGATIVE 03/11/2024 1708   PROTEINUR 30 (A) 03/11/2024 1708   UROBILINOGEN 0.2 10/09/2011 1807   NITRITE NEGATIVE 03/11/2024 1708   LEUKOCYTESUR NEGATIVE 03/11/2024 1708   Sepsis Labs: @LABRCNTIP (procalcitonin:4,lacticidven:4) ) Recent Results (from the past 240 hours)  Resp panel by RT-PCR (RSV, Flu A&B, Covid) Anterior Nasal Swab     Status: None   Collection Time: 03/11/24  5:08 PM   Specimen: Anterior Nasal Swab  Result Value Ref Range Status   SARS Coronavirus 2 by RT PCR NEGATIVE NEGATIVE Final   Influenza A by PCR NEGATIVE NEGATIVE Final   Influenza B by PCR NEGATIVE NEGATIVE Final    Comment: (NOTE) The Xpert Xpress SARS-CoV-2/FLU/RSV plus assay is intended as an aid in the diagnosis of influenza from Nasopharyngeal swab specimens and should not be used as a sole basis for treatment. Nasal washings and aspirates are unacceptable for Xpert Xpress SARS-CoV-2/FLU/RSV testing.  Fact Sheet for Patients: BloggerCourse.com  Fact Sheet for Healthcare Providers: SeriousBroker.it  This test is not yet approved or cleared by the [place]  FDA and has been authorized for detection and/or diagnosis of SARS-CoV-2 by FDA under an Emergency Use Authorization (EUA). This EUA will remain in effect (meaning this test can be used) for the duration of the COVID-19 declaration under Section 564(b)(1) of the Act, 21 U.S.C. section 360bbb-3(b)(1), unless the authorization is terminated or revoked.     Resp Syncytial Virus by PCR  NEGATIVE NEGATIVE Final    Comment: (NOTE) Fact Sheet for Patients: BloggerCourse.com  Fact Sheet for Healthcare Providers: SeriousBroker.it  This test is not yet approved or cleared by the [place]  FDA and has been authorized for detection and/or diagnosis of SARS-CoV-2 by FDA under an Emergency Use Authorization (EUA). This EUA will remain in effect (meaning this test can be used) for the duration of the COVID-19 declaration under Section 564(b)(1) of the Act, 21 U.S.C. section 360bbb-3(b)(1), unless the authorization is terminated or revoked.  Performed at [facility], [address] [place], [place] [address]      Radiological Exams on Admission: CT Angio Chest PE W and/or Wo Contrast Result Date: 03/11/2024 CLINICAL DATA:  Chest pain and shortness of breath. Epigastric abdominal pain. EXAM: CT ANGIOGRAPHY CHEST CT ABDOMEN AND PELVIS WITH CONTRAST TECHNIQUE: Multidetector CT imaging of the chest was performed using the standard protocol during bolus administration of intravenous contrast. Multiplanar CT image reconstructions and MIPs were obtained to evaluate the vascular anatomy. Multidetector CT imaging of the abdomen and pelvis was performed using the standard protocol during bolus administration of intravenous contrast. RADIATION DOSE REDUCTION: This exam was performed according to the departmental dose-optimization program which includes automated exposure control, adjustment of the mA and/or kV according to patient size and/or use of iterative reconstruction technique. CONTRAST:  75mL OMNIPAQUE  IOHEXOL  350 MG/ML SOLN COMPARISON:  Abdomen and pelvis CT dated 01/16/2020. Portable chest obtained earlier today. FINDINGS: CTA CHEST FINDINGS Cardiovascular: Satisfactory opacification of the pulmonary arteries to the segmental level. No evidence of pulmonary embolism. Normal heart size. No pericardial effusion. Mediastinum/Nodes:  Moderate flattening of the trachea and mainstem bronchi. Unremarkable thyroid gland and esophagus. No enlarged lymph nodes. Lungs/Pleura: Lungs are clear. No pleural effusion or pneumothorax. 3 mm noncalcified nodule in the left upper lobe on image number 40/4. 4 mm perifissural lymph node along the inferior surface of the minor fissure in the right middle lobe on image number 54/4, compatible with a small lymph node. No other lung nodules.  No airspace consolidation or pleural fluid. Musculoskeletal: Thoracic spine degenerative changes. Review of the MIP images confirms the above findings. CT ABDOMEN and PELVIS FINDINGS Hepatobiliary: 2 4 mm gallstones in a contracted gallbladder with no gallbladder wall thickening or pericholecystic fluid. Unremarkable liver. Pancreas: Moderate diffuse pancreatic atrophy. Spleen: Normal in size without focal abnormality. Adrenals/Urinary Tract: Adrenal glands are unremarkable. Kidneys are normal, without renal calculi, focal lesion, or hydronephrosis. Bladder is unremarkable. Stomach/Bowel: Multiple mildly to moderately dilated loops of jejunum containing gas and fluid. No wall thickening, abnormal enhancement or pneumatosis. The stomach, duodenum and distal small bowel are normal in caliber. The transition point is difficult to localize. No obstructing mass visualized. Small, normal-appearing appendix. Unremarkable colon. Vascular/Lymphatic: No significant vascular findings are present. No enlarged abdominal or pelvic lymph nodes. Reproductive: Uterus and bilateral adnexa are unremarkable. Other: No abdominal wall hernia or abnormality. No abdominopelvic ascites. Musculoskeletal: Mild left and minimal right hip degenerative changes. Mild lumbar and moderate lower thoracic spine degenerative changes. Review of the MIP images confirms the above findings. IMPRESSION: 1. No pulmonary embolism or acute thoracic abnormality. 2. Moderate flattening of the trachea and mainstem bronchi,  compatible with tracheobronchomalacia. 3. 3 mm noncalcified nodule in the left upper lobe. No follow-up needed if patient is low-risk.This recommendation follows the consensus statement: Guidelines for Management of Incidental Pulmonary Nodules Detected on CT Images: From the Fleischner Society 2017; Radiology 2017; 284:228-243. 4. Multiple mildly to moderately dilated loops of jejunum containing gas and fluid. The transition point is difficult to localize. No obstructing mass visualized. This could represent a partial small bowel obstruction or adynamic ileus. Electronically Signed   By: Elspeth Bathe M.D.   On: 03/11/2024 18:33   CT ABDOMEN PELVIS W CONTRAST Result Date: 03/11/2024 CLINICAL DATA:  Chest pain and shortness of breath. Epigastric abdominal pain. EXAM: CT ANGIOGRAPHY CHEST CT ABDOMEN AND PELVIS WITH CONTRAST TECHNIQUE: Multidetector CT imaging of the chest was performed using the standard protocol during bolus administration of intravenous contrast. Multiplanar CT  image reconstructions and MIPs were obtained to evaluate the vascular anatomy. Multidetector CT imaging of the abdomen and pelvis was performed using the standard protocol during bolus administration of intravenous contrast. RADIATION DOSE REDUCTION: This exam was performed according to the departmental dose-optimization program which includes automated exposure control, adjustment of the mA and/or kV according to patient size and/or use of iterative reconstruction technique. CONTRAST:  75mL OMNIPAQUE  IOHEXOL  350 MG/ML SOLN COMPARISON:  Abdomen and pelvis CT dated 01/16/2020. Portable chest obtained earlier today. FINDINGS: CTA CHEST FINDINGS Cardiovascular: Satisfactory opacification of the pulmonary arteries to the segmental level. No evidence of pulmonary embolism. Normal heart size. No pericardial effusion. Mediastinum/Nodes: Moderate flattening of the trachea and mainstem bronchi. Unremarkable thyroid gland and esophagus. No enlarged  lymph nodes. Lungs/Pleura: Lungs are clear. No pleural effusion or pneumothorax. 3 mm noncalcified nodule in the left upper lobe on image number 40/4. 4 mm perifissural lymph node along the inferior surface of the minor fissure in the right middle lobe on image number 54/4, compatible with a small lymph node. No other lung nodules.  No airspace consolidation or pleural fluid. Musculoskeletal: Thoracic spine degenerative changes. Review of the MIP images confirms the above findings. CT ABDOMEN and PELVIS FINDINGS Hepatobiliary: 2 4 mm gallstones in a contracted gallbladder with no gallbladder wall thickening or pericholecystic fluid. Unremarkable liver. Pancreas: Moderate diffuse pancreatic atrophy. Spleen: Normal in size without focal abnormality. Adrenals/Urinary Tract: Adrenal glands are unremarkable. Kidneys are normal, without renal calculi, focal lesion, or hydronephrosis. Bladder is unremarkable. Stomach/Bowel: Multiple mildly to moderately dilated loops of jejunum containing gas and fluid. No wall thickening, abnormal enhancement or pneumatosis. The stomach, duodenum and distal small bowel are normal in caliber. The transition point is difficult to localize. No obstructing mass visualized. Small, normal-appearing appendix. Unremarkable colon. Vascular/Lymphatic: No significant vascular findings are present. No enlarged abdominal or pelvic lymph nodes. Reproductive: Uterus and bilateral adnexa are unremarkable. Other: No abdominal wall hernia or abnormality. No abdominopelvic ascites. Musculoskeletal: Mild left and minimal right hip degenerative changes. Mild lumbar and moderate lower thoracic spine degenerative changes. Review of the MIP images confirms the above findings. IMPRESSION: 1. No pulmonary embolism or acute thoracic abnormality. 2. Moderate flattening of the trachea and mainstem bronchi, compatible with tracheobronchomalacia. 3. 3 mm noncalcified nodule in the left upper lobe. No follow-up needed  if patient is low-risk.This recommendation follows the consensus statement: Guidelines for Management of Incidental Pulmonary Nodules Detected on CT Images: From the Fleischner Society 2017; Radiology 2017; 284:228-243. 4. Multiple mildly to moderately dilated loops of jejunum containing gas and fluid. The transition point is difficult to localize. No obstructing mass visualized. This could represent a partial small bowel obstruction or adynamic ileus. Electronically Signed   By: Elspeth Bathe M.D.   On: 03/11/2024 18:33   DG Chest Portable 1 View Result Date: 03/11/2024 CLINICAL DATA:  chest pain EXAM: PORTABLE CHEST - 1 VIEW COMPARISON:  05/17/2018 FINDINGS: Lungs are clear.  No pneumothorax. Heart size and mediastinal contours are within normal limits. No effusion. Visualized bones unremarkable. IMPRESSION: No acute cardiopulmonary disease. Electronically Signed   By: JONETTA Faes M.D.   On: 03/11/2024 17:22   VAS US  LOWER EXTREMITY VENOUS (DVT) (7a-7p) Result Date: 03/11/2024  Lower Venous DVT Study Patient Name:  KILA GODINA  Date of Exam:   03/11/2024 Medical Rec #: [id number]           Accession #:    [id number] Date of Birth: [birth year]  Patient Gender: F Patient Age:   88 years Exam Location:  [facility] Procedure:      VAS US  LOWER EXTREMITY VENOUS (DVT) Referring Phys: AMJAD ALI --------------------------------------------------------------------------------  Indications: Swelling, and Edema.  Performing Technologist: Elmarie Lindau, RVT  Examination Guidelines: A complete evaluation includes B-mode imaging, spectral Doppler, color Doppler, and power Doppler as needed of all accessible portions of each vessel. Bilateral testing is considered an integral part of a complete examination. Limited examinations for reoccurring indications may be performed as noted. The reflux portion of the exam is performed with the patient in reverse Trendelenburg.   +---------+---------------+---------+-----------+----------+-------------------+ RIGHT    CompressibilityPhasicitySpontaneityPropertiesThrombus Aging      +---------+---------------+---------+-----------+----------+-------------------+ CFV      Full           Yes      Yes                                      +---------+---------------+---------+-----------+----------+-------------------+ SFJ      Full                                                             +---------+---------------+---------+-----------+----------+-------------------+ FV Prox  Full                                                             +---------+---------------+---------+-----------+----------+-------------------+ FV Mid   Full                                                             +---------+---------------+---------+-----------+----------+-------------------+ FV DistalFull                                                             +---------+---------------+---------+-----------+----------+-------------------+ PFV      Full                                                             +---------+---------------+---------+-----------+----------+-------------------+ POP      Full           Yes      Yes                                      +---------+---------------+---------+-----------+----------+-------------------+ PTV      Full                                                             +---------+---------------+---------+-----------+----------+-------------------+  PERO                                                  Not well visualized +---------+---------------+---------+-----------+----------+-------------------+     Summary: RIGHT: - There is no evidence of deep vein thrombosis in the lower extremity. However, portions of this examination were limited- see technologist comments above.  - No cystic structure found in the popliteal fossa.   *See table(s)  above for measurements and observations. Electronically signed by Penne Colorado MD on 03/11/2024 at 5:03:47 PM.    Final     EKG: Independently reviewed.  Sinus tachycardia.  Assessment/Plan Principal Problem:   SBO (small bowel obstruction) (HCC) Active Problems:   Insulin -requiring or dependent type II diabetes mellitus (HCC)   Psoriatic arthritis (HCC)   SVT (supraventricular tachycardia) (HCC)    Possible small bowel obstruction versus ileus will keep patient NPO.  If patient has further episodes of vomiting will need NG tube.  Check KUB in the morning.  General surgery has been notified. Nausea vomiting and diarrhea check stool studies.  See #1. Chest pain presently chest pain-free.  Could be related to her abdomen.  Check troponins 2D echo.  Closely monitor. History of SVT takes verapamil  which we will continue for now.  If patient cannot take oral medicines we will keep patient on as needed IV metoprolol. Diabetes mellitus type 2 takes concentrated insulin  premeals.  Last hemoglobin A1c was 13.5  8 months ago.  On sliding scale coverage. 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  Since patient has possible small bowel obstruction will need further management and more than 2 midnight stay.   DVT prophylaxis: Heparin . Code Status: Full code. Family Communication: Discussed with patient. Disposition Plan: Medical floor. Consults called: General Surgery. Admission status: Observation.

## 2024-03-11 NOTE — ED Notes (Signed)
 Actively vomiting and visible short of breath speaking in broken sentences.

## 2024-03-11 NOTE — ED Notes (Signed)
 PT states MD unhooked her from cardiac monitoring and states she dosent need it educated PT she still has active order for tele monitoring but she states she dosent want to wear it.

## 2024-03-11 NOTE — ED Notes (Addendum)
 Pt transported to [place]

## 2024-03-11 NOTE — ED Provider Notes (Signed)
 [place] EMERGENCY DEPARTMENT AT [facility] Provider Note  CSN: [id number] Arrival date & time: 03/11/24 1507  Chief Complaint(s) Chest Pain and Shortness of Breath  HPI [name] is a 53 y.o. female with past medical history as below, significant for afib, svt, t2dm, pcos, psoriasis who presents to the ED with complaint of dib/cp  Not on anticoagulation  Patient reports has been feeling unwell since Sunday, midsternal chest pain, dyspnea, and nonproductive cough.  midSternal chest pain radiates to her shoulders.  Symptoms have progressively worsened over the past couple days.  She began having nausea and vomiting and epigastric pain this morning.  No fevers and chills.  No change with urination or bowel movements.  No recent travel or sick contacts, no suspicious p.o. intake.  Compliant with regular medications.    Past Medical History Past Medical History:  Diagnosis Date   Atrial fibrillation (HCC)    Diabetic retinopathy (HCC)    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)    been dealing w/it for ~ 1 yr (05/20/2018)   Polycystic ovarian syndrome    Psoriasis    Psoriatic arthritis (HCC)    pretty much all over; knees, hands, elbows (05/20/2018)   SVT (supraventricular tachycardia) (HCC)    had 1 chemical conversion here in the ER (05/20/2018)   Type II diabetes mellitus (HCC)    Patient Active Problem List   Diagnosis Date Noted   SBO (small bowel obstruction) (HCC) 03/11/2024   Facial cellulitis 07/13/2023   Cellulitis of face 07/12/2023   Charcot's joint of left foot 12/26/2021   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   Body mass index (BMI) 36.0-36.9, adult 07/05/2021   Diabetic peripheral neuropathy (HCC) 07/05/2021   Fatigue 07/05/2021   Joint pain 07/05/2021   Other long term (current) drug therapy 07/05/2021   Psoriasis 07/05/2021   Pain and swelling of right wrist 10/21/2018   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  (HCC) 10/21/2018   AF (paroxysmal atrial fibrillation) (HCC) 10/21/2018   Sepsis due to cellulitis (HCC) 05/20/2018   UTI (urinary tract infection) 05/20/2018   Diabetic foot ulcer (HCC) 06/22/2017   Fracture of base of fifth metatarsal bone with routine healing 06/22/2017   SVT (supraventricular tachycardia) (HCC) 05/14/2014   Pyelonephritis, acute 10/09/2011   Insulin -requiring or dependent type II diabetes mellitus (HCC) 12/02/2009   Polycystic ovaries 12/02/2009   MIGRAINE HEADACHE 12/02/2009   IBS 12/02/2009   Psoriatic arthritis (HCC) 12/02/2009   MICROCYTOSIS 12/02/2009   Home Medication(s) Prior to Admission medications   Medication Sig Start Date End Date Taking? Authorizing Provider  acetaminophen  (TYLENOL ) 500 MG tablet Take 1,000 mg by mouth every 6 (six) hours as needed for mild pain or headache.    [provider]  albuterol  (VENTOLIN  HFA) 108 (90 Base) MCG/ACT inhaler Inhale 2 puffs into the lungs every 6 (six) hours as needed for wheezing or shortness of breath. 12/03/18   [provider]  Cholecalciferol (VITAMIN D3) 50 MCG (2000 UT) TABS Take by mouth in the morning and at bedtime.    [provider]  escitalopram  (LEXAPRO ) 20 MG tablet Take 20 mg by mouth daily.    [provider]  estradiol (ESTRACE) 0.1 MG/GM vaginal cream Place 1 Applicatorful vaginally once a week. As needed 10/28/20   [provider]  fluconazole (DIFLUCAN) 150 MG tablet Take 150 mg by mouth as needed (bacterial infection). 12/19/19   [provider]  HUMULIN  R U-500  KWIKPEN 500 UNIT/ML KwikPen Inject 150-180 Units into the skin in the morning, at noon, and at bedtime. Inject 180 units in morning, 150 units at lunch and dinner    [provider]  ibuprofen  (ADVIL ,MOTRIN ) 200 MG tablet Take 800 mg by mouth daily as needed (pain).    [provider]  ketorolac  (ACULAR ) 0.5 % ophthalmic solution Place 1 drop into the right eye in the  morning, at noon, in the evening, and at bedtime. 12/03/23     meloxicam  (MOBIC ) 15 MG tablet Take 15 mg by mouth daily. 10/12/21   [provider]  norethindrone (AYGESTIN) 5 MG tablet Take 5 mg by mouth daily. 11/10/18   [provider]  nystatin-triamcinolone (MYCOLOG II) cream Apply 1 application topically daily as needed (rash/irritation).  10/01/18   [provider]  omeprazole (PRILOSEC) 20 MG capsule Take 20 mg by mouth every morning. 02/26/20   [provider]  prednisoLONE  acetate (PRED FORTE ) 1 % ophthalmic suspension Place 1 drop into the right eye daily. 12/03/23     prednisoLONE  acetate (PRED FORTE ) 1 % ophthalmic suspension Place 1 drop into the right eye 2 (two) times daily. 02/06/24     prednisoLONE  acetate (PRED FORTE ) 1 % ophthalmic suspension Apply INSTILL ONE DROP INTO THE RIGHT EYE ONCE A DAY 02/06/24     rosuvastatin  (CRESTOR ) 5 MG tablet Take 5 mg by mouth daily. 09/14/18   [provider]  TALTZ 80 MG/ML SOAJ Inject 80 mg into the skin every 28 (twenty-eight) days. 03/10/20   [provider]  trimethoprim -polymyxin b  (POLYTRIM ) ophthalmic solution Place 1 drop into the right eye 4 (four) times daily. 12/03/23   Jaye Fallow, MD  verapamil  (CALAN -SR) 240 MG CR tablet TAKE 1 TABLET BY MOUTH AT BEDTIME Patient taking differently: Take 240 mg by mouth daily. 11/25/15   Dann Candyce RAMAN, MD                                                                                                                                    Past Surgical History Past Surgical History:  Procedure Laterality Date   AMPUTATION TOE Right 05/22/2018   Procedure: AMPUTATION RIGHT TOE;  Surgeon: Gershon Donnice SAUNDERS, DPM;  Location: [facility] OR;  Service: Podiatry;  Laterality: Right;   CATARACT EXTRACTION W/PHACO Right 01/22/2024   Procedure: PHACOEMULSIFICATION, CATARACT, WITH IOL INSERTION 4.83 00:32.1;  Surgeon: Jaye Fallow, MD;  Location[facility];  Service: Ophthalmology;  Laterality: Right;   CESAREAN SECTION  2005   FOOT FRACTURE SURGERY Left    pin put in by Dr. Gershon   FRACTURE SURGERY     TUBAL LIGATION  2005   Family History Family History  Problem Relation Age of Onset   Hypertension Mother    Diabetes Mother    Asthma Mother    CAD Father    Diabetes Sister    Diabetes Brother  Hypertension Brother     Social History Social History   Tobacco Use   Smoking status: Never   Smokeless tobacco: Never  Vaping Use   Vaping status: Never Used  Substance Use Topics   Alcohol use: Not Currently   Drug use: Never   Allergies Cephalosporins, Apremilast, Ozempic (0.25 or 0.5 mg-dose) [semaglutide(0.25 or 0.5mg -dos)], Pneumococcal vac polyvalent, Synjardy [empagliflozin-metformin hcl], Trulicity [dulaglutide], Ciprofloxacin , Keflex  [cephalexin ], and Latex  Review of Systems A thorough review of systems was obtained and all systems are negative except as noted in the HPI and PMH.   Physical Exam Vital Signs  I have reviewed the triage vital signs BP (!) 177/81   Pulse (!) 116   Temp 98.6 F (37 C)   Resp 20   Ht 5' 7 (1.702 m)   Wt 113.4 kg   LMP 05/13/2018 Comment: tubal ligation  SpO2 97%   BMI 39.16 kg/m  Physical Exam Vitals and nursing note reviewed.  Constitutional:      General: She is not in acute distress.    Appearance: Normal appearance. She is obese. She is diaphoretic.  HENT:     Head: Normocephalic and atraumatic.     Right Ear: External ear normal.     Left Ear: External ear normal.     Nose: Nose normal.     Mouth/Throat:     Mouth: Mucous membranes are moist.   Eyes:     General: No scleral icterus.       Right eye: No discharge.        Left eye: No discharge.    Cardiovascular:     Rate and Rhythm: Regular rhythm. Tachycardia present.     Pulses: Normal pulses.     Heart sounds: Normal heart sounds.  Pulmonary:     Effort: Pulmonary effort is normal. No  respiratory distress.     Breath sounds: Decreased air movement present. No stridor. Decreased breath sounds present.  Abdominal:     General: Abdomen is flat. There is no distension.     Palpations: Abdomen is soft.     Tenderness: There is abdominal tenderness. There is no guarding or rebound.   Musculoskeletal:     Cervical back: No rigidity.     Right lower leg: No edema.     Left lower leg: No edema.   Skin:    General: Skin is warm.     Capillary Refill: Capillary refill takes less than 2 seconds.   Neurological:     Mental Status: She is alert.   Psychiatric:        Mood and Affect: Mood normal.        Behavior: Behavior normal. Behavior is cooperative.     ED Results and Treatments Labs (all labs ordered are listed, but only abnormal results are displayed) Labs Reviewed  CBC WITH DIFFERENTIAL/PLATELET - Abnormal; Notable for the following components:      Result Value   WBC 12.0 (*)    Neutro Abs 10.1 (*)    All other components within normal limits  COMPREHENSIVE METABOLIC PANEL WITH GFR - Abnormal; Notable for the following components:   Glucose, Bld 170 (*)    All other components within normal limits  URINALYSIS, ROUTINE W REFLEX MICROSCOPIC - Abnormal; Notable for the following components:   APPearance HAZY (*)    Glucose, UA 50 (*)    Protein, ur 30 (*)    Bacteria, UA RARE (*)    All other components within normal  limits  RESP PANEL BY RT-PCR (RSV, FLU A&B, COVID)  [id number]  BRAIN NATRIURETIC PEPTIDE  LIPASE, BLOOD  TSH  T4, FREE  TROPONIN I (HIGH SENSITIVITY)  TROPONIN I (HIGH SENSITIVITY)                                                                                                                          Radiology CT Angio Chest PE W and/or Wo Contrast Result Date: 03/11/2024 CLINICAL DATA:  Chest pain and shortness of breath. Epigastric abdominal pain. EXAM: CT ANGIOGRAPHY CHEST CT ABDOMEN AND PELVIS WITH CONTRAST TECHNIQUE: Multidetector CT  imaging of the chest was performed using the standard protocol during bolus administration of intravenous contrast. Multiplanar CT image reconstructions and MIPs were obtained to evaluate the vascular anatomy. Multidetector CT imaging of the abdomen and pelvis was performed using the standard protocol during bolus administration of intravenous contrast. RADIATION DOSE REDUCTION: This exam was performed according to the departmental dose-optimization program which includes automated exposure control, adjustment of the mA and/or kV according to patient size and/or use of iterative reconstruction technique. CONTRAST:  75mL OMNIPAQUE  IOHEXOL  350 MG/ML SOLN COMPARISON:  Abdomen and pelvis CT dated 01/16/2020. Portable chest obtained earlier today. FINDINGS: CTA CHEST FINDINGS Cardiovascular: Satisfactory opacification of the pulmonary arteries to the segmental level. No evidence of pulmonary embolism. Normal heart size. No pericardial effusion. Mediastinum/Nodes: Moderate flattening of the trachea and mainstem bronchi. Unremarkable thyroid gland and esophagus. No enlarged lymph nodes. Lungs/Pleura: Lungs are clear. No pleural effusion or pneumothorax. 3 mm noncalcified nodule in the left upper lobe on image number 40/4. 4 mm perifissural lymph node along the inferior surface of the minor fissure in the right middle lobe on image number 54/4, compatible with a small lymph node. No other lung nodules.  No airspace consolidation or pleural fluid. Musculoskeletal: Thoracic spine degenerative changes. Review of the MIP images confirms the above findings. CT ABDOMEN and PELVIS FINDINGS Hepatobiliary: 2 4 mm gallstones in a contracted gallbladder with no gallbladder wall thickening or pericholecystic fluid. Unremarkable liver. Pancreas: Moderate diffuse pancreatic atrophy. Spleen: Normal in size without focal abnormality. Adrenals/Urinary Tract: Adrenal glands are unremarkable. Kidneys are normal, without renal calculi, focal  lesion, or hydronephrosis. Bladder is unremarkable. Stomach/Bowel: Multiple mildly to moderately dilated loops of jejunum containing gas and fluid. No wall thickening, abnormal enhancement or pneumatosis. The stomach, duodenum and distal small bowel are normal in caliber. The transition point is difficult to localize. No obstructing mass visualized. Small, normal-appearing appendix. Unremarkable colon. Vascular/Lymphatic: No significant vascular findings are present. No enlarged abdominal or pelvic lymph nodes. Reproductive: Uterus and bilateral adnexa are unremarkable. Other: No abdominal wall hernia or abnormality. No abdominopelvic ascites. Musculoskeletal: Mild left and minimal right hip degenerative changes. Mild lumbar and moderate lower thoracic spine degenerative changes. Review of the MIP images confirms the above findings. IMPRESSION: 1. No pulmonary embolism or acute thoracic abnormality. 2. Moderate flattening of the trachea and mainstem bronchi, compatible with tracheobronchomalacia. 3. 3  mm noncalcified nodule in the left upper lobe. No follow-up needed if patient is low-risk.This recommendation follows the consensus statement: Guidelines for Management of Incidental Pulmonary Nodules Detected on CT Images: From the Fleischner Society 2017; Radiology 2017; 284:228-243. 4. Multiple mildly to moderately dilated loops of jejunum containing gas and fluid. The transition point is difficult to localize. No obstructing mass visualized. This could represent a partial small bowel obstruction or adynamic ileus. Electronically Signed   By: Elspeth Bathe M.D.   On: 03/11/2024 18:33   CT ABDOMEN PELVIS W CONTRAST Result Date: 03/11/2024 CLINICAL DATA:  Chest pain and shortness of breath. Epigastric abdominal pain. EXAM: CT ANGIOGRAPHY CHEST CT ABDOMEN AND PELVIS WITH CONTRAST TECHNIQUE: Multidetector CT imaging of the chest was performed using the standard protocol during bolus administration of intravenous  contrast. Multiplanar CT image reconstructions and MIPs were obtained to evaluate the vascular anatomy. Multidetector CT imaging of the abdomen and pelvis was performed using the standard protocol during bolus administration of intravenous contrast. RADIATION DOSE REDUCTION: This exam was performed according to the departmental dose-optimization program which includes automated exposure control, adjustment of the mA and/or kV according to patient size and/or use of iterative reconstruction technique. CONTRAST:  75mL OMNIPAQUE  IOHEXOL  350 MG/ML SOLN COMPARISON:  Abdomen and pelvis CT dated 01/16/2020. Portable chest obtained earlier today. FINDINGS: CTA CHEST FINDINGS Cardiovascular: Satisfactory opacification of the pulmonary arteries to the segmental level. No evidence of pulmonary embolism. Normal heart size. No pericardial effusion. Mediastinum/Nodes: Moderate flattening of the trachea and mainstem bronchi. Unremarkable thyroid gland and esophagus. No enlarged lymph nodes. Lungs/Pleura: Lungs are clear. No pleural effusion or pneumothorax. 3 mm noncalcified nodule in the left upper lobe on image number 40/4. 4 mm perifissural lymph node along the inferior surface of the minor fissure in the right middle lobe on image number 54/4, compatible with a small lymph node. No other lung nodules.  No airspace consolidation or pleural fluid. Musculoskeletal: Thoracic spine degenerative changes. Review of the MIP images confirms the above findings. CT ABDOMEN and PELVIS FINDINGS Hepatobiliary: 2 4 mm gallstones in a contracted gallbladder with no gallbladder wall thickening or pericholecystic fluid. Unremarkable liver. Pancreas: Moderate diffuse pancreatic atrophy. Spleen: Normal in size without focal abnormality. Adrenals/Urinary Tract: Adrenal glands are unremarkable. Kidneys are normal, without renal calculi, focal lesion, or hydronephrosis. Bladder is unremarkable. Stomach/Bowel: Multiple mildly to moderately dilated  loops of jejunum containing gas and fluid. No wall thickening, abnormal enhancement or pneumatosis. The stomach, duodenum and distal small bowel are normal in caliber. The transition point is difficult to localize. No obstructing mass visualized. Small, normal-appearing appendix. Unremarkable colon. Vascular/Lymphatic: No significant vascular findings are present. No enlarged abdominal or pelvic lymph nodes. Reproductive: Uterus and bilateral adnexa are unremarkable. Other: No abdominal wall hernia or abnormality. No abdominopelvic ascites. Musculoskeletal: Mild left and minimal right hip degenerative changes. Mild lumbar and moderate lower thoracic spine degenerative changes. Review of the MIP images confirms the above findings. IMPRESSION: 1. No pulmonary embolism or acute thoracic abnormality. 2. Moderate flattening of the trachea and mainstem bronchi, compatible with tracheobronchomalacia. 3. 3 mm noncalcified nodule in the left upper lobe. No follow-up needed if patient is low-risk.This recommendation follows the consensus statement: Guidelines for Management of Incidental Pulmonary Nodules Detected on CT Images: From the Fleischner Society 2017; Radiology 2017; 284:228-243. 4. Multiple mildly to moderately dilated loops of jejunum containing gas and fluid. The transition point is difficult to localize. No obstructing mass visualized. This could represent a partial small  bowel obstruction or adynamic ileus. Electronically Signed   By: Elspeth Bathe M.D.   On: 03/11/2024 18:33   DG Chest Portable 1 View Result Date: 03/11/2024 CLINICAL DATA:  chest pain EXAM: PORTABLE CHEST - 1 VIEW COMPARISON:  05/17/2018 FINDINGS: Lungs are clear.  No pneumothorax. Heart size and mediastinal contours are within normal limits. No effusion. Visualized bones unremarkable. IMPRESSION: No acute cardiopulmonary disease. Electronically Signed   By: JONETTA Faes M.D.   On: 03/11/2024 17:22   VAS US  LOWER EXTREMITY VENOUS (DVT)  (7a-7p) Result Date: 03/11/2024  Lower Venous DVT Study Patient Name:  KAMILLAH DIDONATO  Date of Exam:   03/11/2024 Medical Rec #: [id number]           Accession #:    [id number] Date of Birth: [birth year]           Patient Gender: F Patient Age:   71 years Exam Location:  [facility] Procedure:      VAS US  LOWER EXTREMITY VENOUS (DVT) Referring Phys: AMJAD ALI --------------------------------------------------------------------------------  Indications: Swelling, and Edema.  Performing Technologist: Elmarie Lindau, RVT  Examination Guidelines: A complete evaluation includes B-mode imaging, spectral Doppler, color Doppler, and power Doppler as needed of all accessible portions of each vessel. Bilateral testing is considered an integral part of a complete examination. Limited examinations for reoccurring indications may be performed as noted. The reflux portion of the exam is performed with the patient in reverse Trendelenburg.  +---------+---------------+---------+-----------+----------+-------------------+ RIGHT    CompressibilityPhasicitySpontaneityPropertiesThrombus Aging      +---------+---------------+---------+-----------+----------+-------------------+ CFV      Full           Yes      Yes                                      +---------+---------------+---------+-----------+----------+-------------------+ SFJ      Full                                                             +---------+---------------+---------+-----------+----------+-------------------+ FV Prox  Full                                                             +---------+---------------+---------+-----------+----------+-------------------+ FV Mid   Full                                                             +---------+---------------+---------+-----------+----------+-------------------+ FV DistalFull                                                              +---------+---------------+---------+-----------+----------+-------------------+ PFV      Full                                                             +---------+---------------+---------+-----------+----------+-------------------+  POP      Full           Yes      Yes                                      +---------+---------------+---------+-----------+----------+-------------------+ PTV      Full                                                             +---------+---------------+---------+-----------+----------+-------------------+ PERO                                                  Not well visualized +---------+---------------+---------+-----------+----------+-------------------+     Summary: RIGHT: - There is no evidence of deep vein thrombosis in the lower extremity. However, portions of this examination were limited- see technologist comments above.  - No cystic structure found in the popliteal fossa.   *See table(s) above for measurements and observations. Electronically signed by Penne Colorado MD on 03/11/2024 at 5:03:47 PM.    Final     Pertinent labs & imaging results that were available during my care of the patient were reviewed by me and considered in my medical decision making (see MDM for details).  Medications Ordered in ED Medications  sodium chloride  0.9 % bolus 1,000 mL (0 mLs Intravenous Stopped 03/11/24 2206)  ondansetron  (ZOFRAN ) injection 4 mg (4 mg Intravenous Given 03/11/24 1713)  pantoprazole  (PROTONIX ) injection 40 mg (40 mg Intravenous Given 03/11/24 1714)  prochlorperazine (COMPAZINE) injection 5 mg (5 mg Intravenous Given 03/11/24 1821)  diphenhydrAMINE  (BENADRYL ) injection 25 mg (25 mg Intravenous Given 03/11/24 1821)  iohexol  (OMNIPAQUE ) 350 MG/ML injection 75 mL (75 mLs Intravenous Contrast Given 03/11/24 1802)  sodium chloride  0.9 % bolus 1,000 mL (0 mLs Intravenous Stopped 03/11/24 2115)  morphine  (PF) 4 MG/ML injection 4 mg (4 mg  Intravenous Given 03/11/24 1943)                                                                                                                                     Procedures Procedures  (including critical care time)  Medical Decision Making / ED Course    Medical Decision Making:    Yacine Droz is a 53 y.o. female with past medical history as below, significant for afib, svt, t2dm, pcos, psoriasis who presents to the ED with complaint of dib/cp. The complaint involves an extensive differential diagnosis and also carries with it a high risk of complications and morbidity.  Serious etiology  was considered. Ddx includes but is not limited to: Differential includes all life-threatening causes for chest pain. This includes but is not exclusive to acute coronary syndrome, aortic dissection, pulmonary embolism, cardiac tamponade, community-acquired pneumonia, pericarditis, musculoskeletal chest wall pain, etc. Differential diagnosis includes but is not exclusive to acute cholecystitis, intrathoracic causes for epigastric abdominal pain, gastritis, duodenitis, pancreatitis, small bowel or large bowel obstruction, abdominal aortic aneurysm, hernia, gastritis, etc.   Complete initial physical exam performed, notably the patient was in no acute distress, hypoxic, she is diaphoretic and tachycardic.    Reviewed and confirmed nursing documentation for past medical history, family history, social history.  Vital signs reviewed.     Brief summary:  53 year old female history as above including A-fib, SVT, PCOS here with chest pain, dyspnea, epigastric pain, nausea and vomiting, fevers and chills Progressive worsening symptoms over the past 3 days.   No hypoxia but she is tachycardic (last tachycardia) and has epigastric TTP.   Clinical Course as of 03/11/24 2215  Tue Mar 11, 2024  1918 CT w/ pulm nodules, also adynamic ileus vs partial sbo.  [SG]  1932 Pt reports some improvement to her  symptoms, nausea has improved. Still having epigastric pain and persistently tachycardic. BP is improved, will give analgesia.  [SG]  2035 Pt w/ ileus vs partial sbo; still having nausea, epig pain, tachycardia. Plan admit/obs  [SG]    Clinical Course User Index [SG] Elnor Jayson LABOR, DO     Labs are stable, she is persistently tachycardic despite continued IV fluids.  Patient reports she is typically tachycardic but this is higher than her baseline.  She received CT PE study, will send thyroid panel.  She is not having palpitations or chest pain. Plan admit for ileus versus partial SBO Notified gen surg of consult             Additional history obtained: -Additional history obtained from na -External records from outside source obtained and reviewed including: Chart review including previous notes, labs, imaging, consultation notes including  Home medications, prior ER evaluation, allergy list, prior admission   Lab Tests: -I ordered, reviewed, and interpreted labs.   The pertinent results include:   Labs Reviewed  CBC WITH DIFFERENTIAL/PLATELET - Abnormal; Notable for the following components:      Result Value   WBC 12.0 (*)    Neutro Abs 10.1 (*)    All other components within normal limits  COMPREHENSIVE METABOLIC PANEL WITH GFR - Abnormal; Notable for the following components:   Glucose, Bld 170 (*)    All other components within normal limits  URINALYSIS, ROUTINE W REFLEX MICROSCOPIC - Abnormal; Notable for the following components:   APPearance HAZY (*)    Glucose, UA 50 (*)    Protein, ur 30 (*)    Bacteria, UA RARE (*)    All other components within normal limits  RESP PANEL BY RT-PCR (RSV, FLU A&B, COVID)  [id number]  BRAIN NATRIURETIC PEPTIDE  LIPASE, BLOOD  TSH  T4, FREE  TROPONIN I (HIGH SENSITIVITY)  TROPONIN I (HIGH SENSITIVITY)    Notable for labs stable  EKG   EKG Interpretation Date/Time:  Tuesday March 11 2024 16:34:31 EDT Ventricular Rate:   133 PR Interval:  145 QRS Duration:  85 QT Interval:  302 QTC Calculation: 450 R Axis:   54  Text Interpretation: Sinus tachycardia Atrial premature complex Low voltage, extremity and precordial leads Baseline wander in lead(s) V1 V5 Confirmed by Elnor Jayson ([id number]) on 03/11/2024 7:32:29  PM         Imaging Studies ordered: imaging studies including chest x-ray, CT chest abdomen pelvis, LE duplex were reviewed I independently visualized the following imaging with scope of interpretation limited to determining acute life threatening conditions related to emergency care; findings noted above I agree with the radiologist interpretation If any imaging was obtained with contrast I closely monitored patient for any possible adverse reaction a/w contrast administration in the emergency department   Medicines ordered and prescription drug management: Meds ordered this encounter  Medications   sodium chloride  0.9 % bolus 1,000 mL   ondansetron  (ZOFRAN ) injection 4 mg   pantoprazole  (PROTONIX ) injection 40 mg   prochlorperazine (COMPAZINE) injection 5 mg   diphenhydrAMINE  (BENADRYL ) injection 25 mg   iohexol  (OMNIPAQUE ) 350 MG/ML injection 75 mL   sodium chloride  0.9 % bolus 1,000 mL   morphine  (PF) 4 MG/ML injection 4 mg    -I have reviewed the patients home medicines and have made adjustments as needed   Consultations Obtained: I requested consultation with the hospitalist,  and discussed lab and imaging findings as well as pertinent plan    Cardiac Monitoring: The patient was maintained on a cardiac monitor.  I personally viewed and interpreted the cardiac monitored which showed an underlying rhythm of: sinus tachycardia Continuous pulse oximetry interpreted by myself, 97% on RA.    Social Determinants of Health:  Diagnosis or treatment significantly limited by social determinants of health: obesity   Reevaluation: After the interventions noted above, I reevaluated the  patient and found that they have improved  Co morbidities that complicate the patient evaluation  Past Medical History:  Diagnosis Date   Atrial fibrillation (HCC)    Diabetic retinopathy (HCC)    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)    been dealing w/it for ~ 1 yr (05/20/2018)   Polycystic ovarian syndrome    Psoriasis    Psoriatic arthritis (HCC)    pretty much all over; knees, hands, elbows (05/20/2018)   SVT (supraventricular tachycardia) (HCC)    had 1 chemical conversion here in the ER (05/20/2018)   Type II diabetes mellitus (HCC)       Dispostion: Disposition decision including need for hospitalization was considered, and patient admitted to the hospital.    Final Clinical Impression(s) / ED Diagnoses Final diagnoses:  Nausea and vomiting, unspecified vomiting type  Ileus (HCC)  Epigastric pain  Sinus tachycardia        Elnor Jayson LABOR, DO 03/11/24 2215

## 2024-03-11 NOTE — ED Triage Notes (Signed)
 Patient is diaphoretic complaining of left sided chest pain that started Sunday night and has returned worse.  Patient is having sweating nausea and sob associated with it.

## 2024-03-12 ENCOUNTER — Observation Stay (HOSPITAL_COMMUNITY)

## 2024-03-12 DIAGNOSIS — D72829 Elevated white blood cell count, unspecified: Secondary | ICD-10-CM | POA: Diagnosis present

## 2024-03-12 DIAGNOSIS — Z91040 Latex allergy status: Secondary | ICD-10-CM | POA: Diagnosis not present

## 2024-03-12 DIAGNOSIS — Z888 Allergy status to other drugs, medicaments and biological substances status: Secondary | ICD-10-CM | POA: Diagnosis not present

## 2024-03-12 DIAGNOSIS — Z881 Allergy status to other antibiotic agents status: Secondary | ICD-10-CM | POA: Diagnosis not present

## 2024-03-12 DIAGNOSIS — Z1152 Encounter for screening for COVID-19: Secondary | ICD-10-CM | POA: Diagnosis not present

## 2024-03-12 DIAGNOSIS — Z8249 Family history of ischemic heart disease and other diseases of the circulatory system: Secondary | ICD-10-CM | POA: Diagnosis not present

## 2024-03-12 DIAGNOSIS — R079 Chest pain, unspecified: Secondary | ICD-10-CM

## 2024-03-12 DIAGNOSIS — E11319 Type 2 diabetes mellitus with unspecified diabetic retinopathy without macular edema: Secondary | ICD-10-CM | POA: Diagnosis present

## 2024-03-12 DIAGNOSIS — Z791 Long term (current) use of non-steroidal anti-inflammatories (NSAID): Secondary | ICD-10-CM | POA: Diagnosis not present

## 2024-03-12 DIAGNOSIS — R000 Tachycardia, unspecified: Secondary | ICD-10-CM | POA: Diagnosis present

## 2024-03-12 DIAGNOSIS — K56609 Unspecified intestinal obstruction, unspecified as to partial versus complete obstruction: Secondary | ICD-10-CM | POA: Diagnosis not present

## 2024-03-12 DIAGNOSIS — L4050 Arthropathic psoriasis, unspecified: Secondary | ICD-10-CM | POA: Diagnosis present

## 2024-03-12 DIAGNOSIS — Z89421 Acquired absence of other right toe(s): Secondary | ICD-10-CM | POA: Diagnosis not present

## 2024-03-12 DIAGNOSIS — Z79899 Other long term (current) drug therapy: Secondary | ICD-10-CM | POA: Diagnosis not present

## 2024-03-12 DIAGNOSIS — Z9851 Tubal ligation status: Secondary | ICD-10-CM | POA: Diagnosis not present

## 2024-03-12 DIAGNOSIS — E282 Polycystic ovarian syndrome: Secondary | ICD-10-CM | POA: Diagnosis present

## 2024-03-12 DIAGNOSIS — K560 Paralytic ileus: Secondary | ICD-10-CM | POA: Diagnosis present

## 2024-03-12 DIAGNOSIS — I4891 Unspecified atrial fibrillation: Secondary | ICD-10-CM | POA: Diagnosis present

## 2024-03-12 DIAGNOSIS — I471 Supraventricular tachycardia, unspecified: Secondary | ICD-10-CM | POA: Diagnosis present

## 2024-03-12 DIAGNOSIS — Z833 Family history of diabetes mellitus: Secondary | ICD-10-CM | POA: Diagnosis not present

## 2024-03-12 DIAGNOSIS — Z794 Long term (current) use of insulin: Secondary | ICD-10-CM | POA: Diagnosis not present

## 2024-03-12 DIAGNOSIS — E669 Obesity, unspecified: Secondary | ICD-10-CM | POA: Diagnosis present

## 2024-03-12 DIAGNOSIS — K56600 Partial intestinal obstruction, unspecified as to cause: Secondary | ICD-10-CM | POA: Diagnosis present

## 2024-03-12 DIAGNOSIS — R911 Solitary pulmonary nodule: Secondary | ICD-10-CM | POA: Diagnosis present

## 2024-03-12 DIAGNOSIS — Z825 Family history of asthma and other chronic lower respiratory diseases: Secondary | ICD-10-CM | POA: Diagnosis not present

## 2024-03-12 DIAGNOSIS — J9809 Other diseases of bronchus, not elsewhere classified: Secondary | ICD-10-CM | POA: Diagnosis present

## 2024-03-12 DIAGNOSIS — K219 Gastro-esophageal reflux disease without esophagitis: Secondary | ICD-10-CM | POA: Diagnosis present

## 2024-03-12 LAB — C DIFFICILE QUICK SCREEN W PCR REFLEX
C Diff antigen: NEGATIVE
C Diff interpretation: NOT DETECTED
C Diff toxin: NEGATIVE

## 2024-03-12 LAB — GLUCOSE, CAPILLARY
Glucose-Capillary: 180 mg/dL — ABNORMAL HIGH (ref 70–99)
Glucose-Capillary: 211 mg/dL — ABNORMAL HIGH (ref 70–99)
Glucose-Capillary: 247 mg/dL — ABNORMAL HIGH (ref 70–99)
Glucose-Capillary: 223 mg/dL — ABNORMAL HIGH (ref 70–99)
Glucose-Capillary: 203 mg/dL — ABNORMAL HIGH (ref 70–99)

## 2024-03-12 LAB — CBC
HCT: 43.4 % (ref 36.0–46.0)
Hemoglobin: 14.5 g/dL (ref 12.0–15.0)
MCH: 30.5 pg (ref 26.0–34.0)
MCHC: 33.4 g/dL (ref 30.0–36.0)
MCV: 91.4 fL (ref 80.0–100.0)
Platelets: 335 10*3/uL (ref 150–400)
RBC: 4.75 MIL/uL (ref 3.87–5.11)
RDW: 14.7 % (ref 11.5–15.5)
WBC: 12.8 10*3/uL — ABNORMAL HIGH (ref 4.0–10.5)
nRBC: 0.2 % (ref 0.0–0.2)

## 2024-03-12 LAB — COMPREHENSIVE METABOLIC PANEL WITH GFR
ALT: 18 U/L (ref 0–44)
AST: 22 U/L (ref 15–41)
Albumin: 3.1 g/dL — ABNORMAL LOW (ref 3.5–5.0)
Alkaline Phosphatase: 58 U/L (ref 38–126)
Anion gap: 9 (ref 5–15)
BUN: 10 mg/dL (ref 6–20)
CO2: 21 mmol/L — ABNORMAL LOW (ref 22–32)
Calcium: 8.4 mg/dL — ABNORMAL LOW (ref 8.9–10.3)
Chloride: 104 mmol/L (ref 98–111)
Creatinine, Ser: 0.59 mg/dL (ref 0.44–1.00)
GFR, Estimated: >60 mL/min (ref >60)
Glucose, Bld: 173 mg/dL — ABNORMAL HIGH (ref 70–99)
Potassium: 3.7 mmol/L (ref 3.5–5.1)
Sodium: 134 mmol/L — ABNORMAL LOW (ref 135–145)
Total Bilirubin: 0.8 mg/dL (ref 0.0–1.2)
Total Protein: 7.3 g/dL (ref 6.5–8.1)

## 2024-03-12 LAB — ECHOCARDIOGRAM COMPLETE
AR max vel: 2.17 cm2
AV Area VTI: 2.33 cm2
AV Area mean vel: 2.13 cm2
AV Mean grad: 8 mmHg
AV Peak grad: 13.7 mmHg
Ao pk vel: 1.85 m/s
Area-P 1/2: 5.02 cm2
Height: 67 in
S' Lateral: 2.7 cm
Weight: 4419.78 [oz_av]

## 2024-03-12 LAB — PHOSPHORUS: Phosphorus: 3.3 mg/dL (ref 2.5–4.6)

## 2024-03-12 LAB — HEMOGLOBIN A1C
Hgb A1c MFr Bld: 9.2 % — ABNORMAL HIGH (ref 4.8–5.6)
Mean Plasma Glucose: 217.34 mg/dL

## 2024-03-12 LAB — MAGNESIUM: Magnesium: 1.5 mg/dL — ABNORMAL LOW (ref 1.7–2.4)

## 2024-03-12 LAB — TSH: TSH: 3.545 u[IU]/mL (ref 0.350–4.500)

## 2024-03-12 MED ORDER — PHENOL 1.4 % MT LIQD
1.0000 | OROMUCOSAL | Status: DC | PRN
  Administered 2024-03-12: 1 via OROMUCOSAL
  Filled 2024-03-12: qty 177

## 2024-03-12 MED ORDER — DIATRIZOATE MEGLUMINE & SODIUM 66-10 % PO SOLN
90.0000 mL | Freq: Once | ORAL | Status: AC
  Administered 2024-03-12: 90 mL via NASOGASTRIC
  Filled 2024-03-12: qty 90

## 2024-03-12 MED ORDER — HYDROMORPHONE HCL 1 MG/ML IJ SOLN
0.2000 mg | INTRAMUSCULAR | Status: DC | PRN
  Administered 2024-03-12 – 2024-03-13 (×4): 0.2 mg via INTRAVENOUS
  Filled 2024-03-12 (×4): qty 0.5

## 2024-03-12 MED ORDER — INSULIN ASPART 100 UNIT/ML IJ SOLN
0.0000 [IU] | INTRAMUSCULAR | Status: DC
  Administered 2024-03-12 – 2024-03-13 (×4): 5 [IU] via SUBCUTANEOUS

## 2024-03-12 MED ORDER — LACTATED RINGERS IV SOLN: INTRAVENOUS | Status: AC

## 2024-03-12 MED ORDER — PREDNISOLONE ACETATE 1 % OP SUSP
1.0000 [drp] | Freq: Every day | OPHTHALMIC | Status: DC
  Administered 2024-03-12 – 2024-03-13 (×2): 1 [drp] via OPHTHALMIC
  Filled 2024-03-12: qty 5

## 2024-03-12 MED ORDER — ONDANSETRON HCL 4 MG/2ML IJ SOLN
4.0000 mg | Freq: Four times a day (QID) | INTRAMUSCULAR | Status: DC | PRN
  Administered 2024-03-12 – 2024-03-14 (×2): 4 mg via INTRAVENOUS
  Filled 2024-03-12 (×2): qty 2

## 2024-03-12 NOTE — Progress Notes (Signed)
   03/12/24 1240  Vitals  BP (!) 146/79  BP Location Right Arm  BP Method Automatic  Patient Position (if appropriate) Sitting  Pulse Rate (!) 110  Pulse Rate Source Dinamap  Resp 17  Level of Consciousness  Level of Consciousness Alert  MEWS COLOR  MEWS Score Color Green  Oxygen Therapy  SpO2 97 %  O2 Device Room Air  Pain Assessment  Pain Scale 0-10  Pain Score 0  MEWS Score  MEWS Temp 0  MEWS Systolic 0  MEWS Pulse 1  MEWS RR 0  MEWS LOC 0  MEWS Score 1

## 2024-03-12 NOTE — ED Notes (Signed)
 ED TO INPATIENT HANDOFF REPORT  ED Nurse Name and Phone #: D Jovie Swanner RN   S Name/Age/Gender [name] 53 y.o. female Room/Bed: 010C/010C  Code Status   Code Status: Full Code  Home/SNF/Other Home Patient oriented to: self Is this baseline? Yes   Triage Complete: Triage complete  Chief Complaint SBO (small bowel obstruction) (HCC) [K56.609]  Triage Note Patient is diaphoretic complaining of left sided chest pain that started Sunday night and has returned worse.  Patient is having sweating nausea and sob associated with it.     Allergies Allergies  Allergen Reactions   Cephalosporins Hives   Apremilast Diarrhea   Ozempic (0.25 Or 0.5 Mg-Dose) [Semaglutide(0.25 Or 0.5mg -Dos)] Diarrhea and Nausea And Vomiting    Also, pancreatitis   Pneumococcal Vac Polyvalent     Other Reaction(s): swelling, itching, and redness at site of intradermal Pneumovax injection   Synjardy [Empagliflozin-Metformin Hcl] Other (See Comments)    Yeast Infection   Trulicity [Dulaglutide] Diarrhea and Nausea And Vomiting   Ciprofloxacin  Rash   Keflex  [Cephalexin ] Rash   Latex Rash    Exam gloves when worn and when touched by care givers    Level of Care/Admitting Diagnosis ED Disposition     ED Disposition  Admit   Condition  --   Comment  Hospital Area: [facility] [[id number]]  Level of Care: Telemetry Medical [[id number]]  May place patient in observation at [facility] or [facility] if equivalent level of care is available:: No  Covid Evaluation: Asymptomatic - no recent exposure (last 10 days) testing not required  Diagnosis: SBO (small bowel obstruction) [facility]) [[id number]]  Admitting Physician: FRANKY REDIA SAILOR [phone]  Attending Physician: FRANKY REDIA SAILOR Dulcian.Dow          B Medical/Surgery History Past Medical History:  Diagnosis Date   Atrial fibrillation (HCC)    Diabetic retinopathy (HCC)    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)    been dealing  w/it for ~ 1 yr (05/20/2018)   Polycystic ovarian syndrome    Psoriasis    Psoriatic arthritis (HCC)    pretty much all over; knees, hands, elbows (05/20/2018)   SVT (supraventricular tachycardia) (HCC)    had 1 chemical conversion here in the ER (05/20/2018)   Type II diabetes mellitus (HCC)    Past Surgical History:  Procedure Laterality Date   AMPUTATION TOE Right 05/22/2018   Procedure: AMPUTATION RIGHT TOE;  Surgeon: Gershon Donnice SAUNDERS, DPM;  Location: [facility] OR;  Service: Podiatry;  Laterality: Right;   CATARACT EXTRACTION W/PHACO Right 01/22/2024   Procedure: PHACOEMULSIFICATION, CATARACT, WITH IOL INSERTION 4.83 00:32.1;  Surgeon: Jaye Fallow, MD;  Location: [facility];  Service: Ophthalmology;  Laterality: Right;   CESAREAN SECTION  2005   FOOT FRACTURE SURGERY Left    pin put in by Dr. Gershon   FRACTURE SURGERY     TUBAL LIGATION  2005     A IV Location/Drains/Wounds Patient Lines/Drains/Airways Status     Active Line/Drains/Airways     Name Placement date Placement time Site Days   Peripheral IV 03/11/24 20 G Posterior;Right Forearm 03/11/24  1640  Forearm  1   Wound / Incision (Open or Dehisced) 05/20/18 Abdomen Lower;Medial rash in naval 05/20/18  2300  Abdomen  2123            Intake/Output Last 24 hours  Intake/Output Summary (Last 24 hours) at 03/12/2024 0103 Last data filed at 03/11/2024 2206 Gross per 24 hour  Intake 2000.46 ml  Output --  Net 2000.46 ml    Labs/Imaging Results for orders placed or performed during the hospital encounter of 03/11/24 (from the past 48 hours)  CBC with Differential     Status: Abnormal   Collection Time: 03/11/24  3:42 PM  Result Value Ref Range   WBC 12.0 (H) 4.0 - 10.5 K/uL   RBC 4.90 3.87 - 5.11 MIL/uL   Hemoglobin 14.7 12.0 - 15.0 g/dL   HCT 54.6 63.9 - 53.9 %   MCV 92.4 80.0 - 100.0 fL   MCH 30.0 26.0 - 34.0 pg   MCHC 32.5 30.0 - 36.0 g/dL   RDW 85.3 88.4 - 84.4 %   Platelets 335 150 - 400 K/uL    nRBC 0.0 0.0 - 0.2 %   Neutrophils Relative % 84 %   Neutro Abs 10.1 (H) 1.7 - 7.7 K/uL   Lymphocytes Relative 8 %   Lymphs Abs 1.0 0.7 - 4.0 K/uL   Monocytes Relative 6 %   Monocytes Absolute 0.7 0.1 - 1.0 K/uL   Eosinophils Relative 1 %   Eosinophils Absolute 0.1 0.0 - 0.5 K/uL   Basophils Relative 0 %   Basophils Absolute 0.0 0.0 - 0.1 K/uL   Immature Granulocytes 1 %   Abs Immature Granulocytes 0.07 0.00 - 0.07 K/uL    Comment: Performed at [facility], [address], [place], [place] [address]  Comprehensive metabolic panel     Status: Abnormal   Collection Time: 03/11/24  3:42 PM  Result Value Ref Range   Sodium 136 135 - 145 mmol/L   Potassium 3.6 3.5 - 5.1 mmol/L   Chloride 100 98 - 111 mmol/L   CO2 24 22 - 32 mmol/L   Glucose, Bld 170 (H) 70 - 99 mg/dL    Comment: Glucose reference range applies only to samples taken after fasting for at least 8 hours.   BUN 9 6 - 20 mg/dL   Creatinine, Ser 9.45 0.44 - 1.00 mg/dL   Calcium  9.3 8.9 - 10.3 mg/dL   Total Protein 7.9 6.5 - 8.1 g/dL   Albumin 3.5 3.5 - 5.0 g/dL   AST 24 15 - 41 U/L   ALT 22 0 - 44 U/L   Alkaline Phosphatase 57 38 - 126 U/L   Total Bilirubin 0.6 0.0 - 1.2 mg/dL   GFR, Estimated >39 >39 mL/min    Comment: (NOTE) Calculated using the CKD-EPI Creatinine Equation (2021)    Anion gap 12 5 - 15    Comment: Performed at [facility], [address], [place], [place] [address]  Troponin I (High Sensitivity)     Status: None   Collection Time: 03/11/24  3:42 PM  Result Value Ref Range   Troponin I (High Sensitivity) 7 <18 ng/L    Comment: (NOTE) Elevated high sensitivity troponin I (hsTnI) values and significant  changes across serial measurements may suggest ACS but many other  chronic and acute conditions are known to elevate hsTnI results.  Refer to the Links section for chest pain algorithms and additional  guidance. Performed at [facility], [address] [place],  [place] [address]   Brain natriuretic peptide     Status: None   Collection Time: 03/11/24  3:42 PM  Result Value Ref Range   B Natriuretic Peptide 39.4 0.0 - 100.0 pg/mL    Comment: Performed at [facility], [address], [place], [place] [address]  Resp panel by RT-PCR (RSV, Flu A&B, Covid) Anterior Nasal Swab     Status: None   Collection Time: 03/11/24  5:08 PM   Specimen: Anterior Nasal Swab  Result Value Ref Range   SARS Coronavirus 2 by RT PCR NEGATIVE NEGATIVE   Influenza A by PCR NEGATIVE NEGATIVE   Influenza B by PCR NEGATIVE NEGATIVE    Comment: (NOTE) The Xpert Xpress SARS-CoV-2/FLU/RSV plus assay is intended as an aid in the diagnosis of influenza from Nasopharyngeal swab specimens and should not be used as a sole basis for treatment. Nasal washings and aspirates are unacceptable for Xpert Xpress SARS-CoV-2/FLU/RSV testing.  Fact Sheet for Patients: BloggerCourse.com  Fact Sheet for Healthcare Providers: SeriousBroker.it  This test is not yet approved or cleared by the [place]  FDA and has been authorized for detection and/or diagnosis of SARS-CoV-2 by FDA under an Emergency Use Authorization (EUA). This EUA will remain in effect (meaning this test can be used) for the duration of the COVID-19 declaration under Section 564(b)(1) of the Act, 21 U.S.C. section 360bbb-3(b)(1), unless the authorization is terminated or revoked.     Resp Syncytial Virus by PCR NEGATIVE NEGATIVE    Comment: (NOTE) Fact Sheet for Patients: BloggerCourse.com  Fact Sheet for Healthcare Providers: SeriousBroker.it  This test is not yet approved or cleared by the [place]  FDA and has been authorized for detection and/or diagnosis of SARS-CoV-2 by FDA under an Emergency Use Authorization (EUA). This EUA will remain in effect (meaning this test can be used) for the duration of  the COVID-19 declaration under Section 564(b)(1) of the Act, 21 U.S.C. section 360bbb-3(b)(1), unless the authorization is terminated or revoked.  Performed at [facility], [address] [place], [place] [address]   Urinalysis, Routine w reflex microscopic -Urine, Clean Catch     Status: Abnormal   Collection Time: 03/11/24  5:08 PM  Result Value Ref Range   Color, Urine YELLOW YELLOW   APPearance HAZY (A) CLEAR   Specific Gravity, Urine 1.016 1.005 - 1.030   pH 5.0 5.0 - 8.0   Glucose, UA 50 (A) NEGATIVE mg/dL   Hgb urine dipstick NEGATIVE NEGATIVE   Bilirubin Urine NEGATIVE NEGATIVE   Ketones, ur NEGATIVE NEGATIVE mg/dL   Protein, ur 30 (A) NEGATIVE mg/dL   Nitrite NEGATIVE NEGATIVE   Leukocytes,Ua NEGATIVE NEGATIVE   RBC / HPF 0-5 0 - 5 RBC/hpf   WBC, UA 0-5 0 - 5 WBC/hpf   Bacteria, UA RARE (A) NONE SEEN   Squamous Epithelial / HPF 0-5 0 - 5 /HPF   Mucus PRESENT     Comment: Performed at [facility], [address], [place], [place] [address]  Troponin I (High Sensitivity)     Status: None   Collection Time: 03/11/24  5:54 PM  Result Value Ref Range   Troponin I (High Sensitivity) 4 <18 ng/L    Comment: (NOTE) Elevated high sensitivity troponin I (hsTnI) values and significant  changes across serial measurements may suggest ACS but many other  chronic and acute conditions are known to elevate hsTnI results.  Refer to the Links section for chest pain algorithms and additional  guidance. Performed at [facility], [address] [place], [place] [address]   Lipase, blood     Status: None   Collection Time: 03/11/24  5:54 PM  Result Value Ref Range   Lipase 21 11 - 51 U/L    Comment: Performed at [facility], [address], [place], [place] [address]  TSH     Status: None   Collection Time: 03/11/24 10:32 PM  Result Value Ref Range   TSH 4.391 0.350 - 4.500 uIU/mL    Comment: Performed by a 3rd Generation assay with a functional  sensitivity of <=0.01 uIU/mL. Performed at [facility], [address] [place], [place] [address]   T4, free     Status: None   Collection Time: 03/11/24 10:32 PM  Result Value Ref Range   Free T4 0.88 0.61 - 1.12 ng/dL    Comment: (NOTE) Biotin ingestion may interfere with free T4 tests. If the results are inconsistent with the TSH level, previous test results, or the clinical presentation, then consider biotin interference. If needed, order repeat testing after stopping biotin. Performed at [facility], [address] [place], [place] [address]   CBG monitoring, ED     Status: Abnormal   Collection Time: 03/11/24 11:32 PM  Result Value Ref Range   Glucose-Capillary 116 (H) 70 - 99 mg/dL    Comment: Glucose reference range applies only to samples taken after fasting for at least 8 hours.   CT Angio Chest PE W and/or Wo Contrast Result Date: 03/11/2024 CLINICAL DATA:  Chest pain and shortness of breath. Epigastric abdominal pain. EXAM: CT ANGIOGRAPHY CHEST CT ABDOMEN AND PELVIS WITH CONTRAST TECHNIQUE: Multidetector CT imaging of the chest was performed using the standard protocol during bolus administration of intravenous contrast. Multiplanar CT image reconstructions and MIPs were obtained to evaluate the vascular anatomy. Multidetector CT imaging of the abdomen and pelvis was performed using the standard protocol during bolus administration of intravenous contrast. RADIATION DOSE REDUCTION: This exam was performed according to the departmental dose-optimization program which includes automated exposure control, adjustment of the mA and/or kV according to patient size and/or use of iterative reconstruction technique. CONTRAST:  75mL OMNIPAQUE  IOHEXOL  350 MG/ML SOLN COMPARISON:  Abdomen and pelvis CT dated 01/16/2020. Portable chest obtained earlier today. FINDINGS: CTA CHEST FINDINGS Cardiovascular: Satisfactory opacification of the pulmonary arteries to the segmental level.  No evidence of pulmonary embolism. Normal heart size. No pericardial effusion. Mediastinum/Nodes: Moderate flattening of the trachea and mainstem bronchi. Unremarkable thyroid gland and esophagus. No enlarged lymph nodes. Lungs/Pleura: Lungs are clear. No pleural effusion or pneumothorax. 3 mm noncalcified nodule in the left upper lobe on image number 40/4. 4 mm perifissural lymph node along the inferior surface of the minor fissure in the right middle lobe on image number 54/4, compatible with a small lymph node. No other lung nodules.  No airspace consolidation or pleural fluid. Musculoskeletal: Thoracic spine degenerative changes. Review of the MIP images confirms the above findings. CT ABDOMEN and PELVIS FINDINGS Hepatobiliary: 2 4 mm gallstones in a contracted gallbladder with no gallbladder wall thickening or pericholecystic fluid. Unremarkable liver. Pancreas: Moderate diffuse pancreatic atrophy. Spleen: Normal in size without focal abnormality. Adrenals/Urinary Tract: Adrenal glands are unremarkable. Kidneys are normal, without renal calculi, focal lesion, or hydronephrosis. Bladder is unremarkable. Stomach/Bowel: Multiple mildly to moderately dilated loops of jejunum containing gas and fluid. No wall thickening, abnormal enhancement or pneumatosis. The stomach, duodenum and distal small bowel are normal in caliber. The transition point is difficult to localize. No obstructing mass visualized. Small, normal-appearing appendix. Unremarkable colon. Vascular/Lymphatic: No significant vascular findings are present. No enlarged abdominal or pelvic lymph nodes. Reproductive: Uterus and bilateral adnexa are unremarkable. Other: No abdominal wall hernia or abnormality. No abdominopelvic ascites. Musculoskeletal: Mild  left and minimal right hip degenerative changes. Mild lumbar and moderate lower thoracic spine degenerative changes. Review of the MIP images confirms the above findings. IMPRESSION: 1. No pulmonary  embolism or acute thoracic abnormality. 2. Moderate flattening of the trachea and mainstem bronchi, compatible with tracheobronchomalacia. 3. 3 mm noncalcified nodule in the left upper lobe. No follow-up needed if patient is low-risk.This recommendation follows the consensus statement: Guidelines for Management of Incidental Pulmonary Nodules Detected on CT Images: From the Fleischner Society 2017; Radiology 2017; 284:228-243. 4. Multiple mildly to moderately dilated loops of jejunum containing gas and fluid. The transition point is difficult to localize. No obstructing mass visualized. This could represent a partial small bowel obstruction or adynamic ileus. Electronically Signed   By: Elspeth Bathe M.D.   On: 03/11/2024 18:33   CT ABDOMEN PELVIS W CONTRAST Result Date: 03/11/2024 CLINICAL DATA:  Chest pain and shortness of breath. Epigastric abdominal pain. EXAM: CT ANGIOGRAPHY CHEST CT ABDOMEN AND PELVIS WITH CONTRAST TECHNIQUE: Multidetector CT imaging of the chest was performed using the standard protocol during bolus administration of intravenous contrast. Multiplanar CT image reconstructions and MIPs were obtained to evaluate the vascular anatomy. Multidetector CT imaging of the abdomen and pelvis was performed using the standard protocol during bolus administration of intravenous contrast. RADIATION DOSE REDUCTION: This exam was performed according to the departmental dose-optimization program which includes automated exposure control, adjustment of the mA and/or kV according to patient size and/or use of iterative reconstruction technique. CONTRAST:  75mL OMNIPAQUE  IOHEXOL  350 MG/ML SOLN COMPARISON:  Abdomen and pelvis CT dated 01/16/2020. Portable chest obtained earlier today. FINDINGS: CTA CHEST FINDINGS Cardiovascular: Satisfactory opacification of the pulmonary arteries to the segmental level. No evidence of pulmonary embolism. Normal heart size. No pericardial effusion. Mediastinum/Nodes: Moderate  flattening of the trachea and mainstem bronchi. Unremarkable thyroid gland and esophagus. No enlarged lymph nodes. Lungs/Pleura: Lungs are clear. No pleural effusion or pneumothorax. 3 mm noncalcified nodule in the left upper lobe on image number 40/4. 4 mm perifissural lymph node along the inferior surface of the minor fissure in the right middle lobe on image number 54/4, compatible with a small lymph node. No other lung nodules.  No airspace consolidation or pleural fluid. Musculoskeletal: Thoracic spine degenerative changes. Review of the MIP images confirms the above findings. CT ABDOMEN and PELVIS FINDINGS Hepatobiliary: 2 4 mm gallstones in a contracted gallbladder with no gallbladder wall thickening or pericholecystic fluid. Unremarkable liver. Pancreas: Moderate diffuse pancreatic atrophy. Spleen: Normal in size without focal abnormality. Adrenals/Urinary Tract: Adrenal glands are unremarkable. Kidneys are normal, without renal calculi, focal lesion, or hydronephrosis. Bladder is unremarkable. Stomach/Bowel: Multiple mildly to moderately dilated loops of jejunum containing gas and fluid. No wall thickening, abnormal enhancement or pneumatosis. The stomach, duodenum and distal small bowel are normal in caliber. The transition point is difficult to localize. No obstructing mass visualized. Small, normal-appearing appendix. Unremarkable colon. Vascular/Lymphatic: No significant vascular findings are present. No enlarged abdominal or pelvic lymph nodes. Reproductive: Uterus and bilateral adnexa are unremarkable. Other: No abdominal wall hernia or abnormality. No abdominopelvic ascites. Musculoskeletal: Mild left and minimal right hip degenerative changes. Mild lumbar and moderate lower thoracic spine degenerative changes. Review of the MIP images confirms the above findings. IMPRESSION: 1. No pulmonary embolism or acute thoracic abnormality. 2. Moderate flattening of the trachea and mainstem bronchi, compatible  with tracheobronchomalacia. 3. 3 mm noncalcified nodule in the left upper lobe. No follow-up needed if patient is low-risk.This recommendation follows the consensus statement:  Guidelines for Management of Incidental Pulmonary Nodules Detected on CT Images: From the Fleischner Society 2017; Radiology 2017; 284:228-243. 4. Multiple mildly to moderately dilated loops of jejunum containing gas and fluid. The transition point is difficult to localize. No obstructing mass visualized. This could represent a partial small bowel obstruction or adynamic ileus. Electronically Signed   By: Elspeth Bathe M.D.   On: 03/11/2024 18:33   DG Chest Portable 1 View Result Date: 03/11/2024 CLINICAL DATA:  chest pain EXAM: PORTABLE CHEST - 1 VIEW COMPARISON:  05/17/2018 FINDINGS: Lungs are clear.  No pneumothorax. Heart size and mediastinal contours are within normal limits. No effusion. Visualized bones unremarkable. IMPRESSION: No acute cardiopulmonary disease. Electronically Signed   By: JONETTA Faes M.D.   On: 03/11/2024 17:22   VAS US  LOWER EXTREMITY VENOUS (DVT) (7a-7p) Result Date: 03/11/2024  Lower Venous DVT Study Patient Name:  [name]  Date of Exam:   03/11/2024 Medical Rec #: [id number]           Accession #:    [id number] Date of Birth: Apr 26, 1971           Patient Gender: F Patient Age:   70 years Exam Location:  [facility] Procedure:      VAS US  LOWER EXTREMITY VENOUS (DVT) Referring Phys: AMJAD ALI --------------------------------------------------------------------------------  Indications: Swelling, and Edema.  Performing Technologist: Elmarie Lindau, RVT  Examination Guidelines: A complete evaluation includes B-mode imaging, spectral Doppler, color Doppler, and power Doppler as needed of all accessible portions of each vessel. Bilateral testing is considered an integral part of a complete examination. Limited examinations for reoccurring indications may be performed as noted. The reflux portion of  the exam is performed with the patient in reverse Trendelenburg.  +---------+---------------+---------+-----------+----------+-------------------+ RIGHT    CompressibilityPhasicitySpontaneityPropertiesThrombus Aging      +---------+---------------+---------+-----------+----------+-------------------+ CFV      Full           Yes      Yes                                      +---------+---------------+---------+-----------+----------+-------------------+ SFJ      Full                                                             +---------+---------------+---------+-----------+----------+-------------------+ FV Prox  Full                                                             +---------+---------------+---------+-----------+----------+-------------------+ FV Mid   Full                                                             +---------+---------------+---------+-----------+----------+-------------------+ FV DistalFull                                                             +---------+---------------+---------+-----------+----------+-------------------+  PFV      Full                                                             +---------+---------------+---------+-----------+----------+-------------------+ POP      Full           Yes      Yes                                      +---------+---------------+---------+-----------+----------+-------------------+ PTV      Full                                                             +---------+---------------+---------+-----------+----------+-------------------+ PERO                                                  Not well visualized +---------+---------------+---------+-----------+----------+-------------------+     Summary: RIGHT: - There is no evidence of deep vein thrombosis in the lower extremity. However, portions of this examination were limited- see technologist comments above.  - No  cystic structure found in the popliteal fossa.   *See table(s) above for measurements and observations. Electronically signed by Penne Colorado MD on 03/11/2024 at 5:03:47 PM.    Final     Pending Labs Unresulted Labs (From admission, onward)     Start     Ordered   03/12/24 0500  Comprehensive metabolic panel  Tomorrow morning,   R        03/11/24 2318   03/12/24 0500  CBC  Tomorrow morning,   R        03/11/24 2318   03/11/24 2318  Hemoglobin A1c  Once,   R       Comments: To assess prior glycemic control    03/11/24 2318   03/11/24 2318  CBC  (heparin )  Once,   R       Comments: Baseline for heparin  therapy IF NOT ALREADY DRAWN.  Notify MD if PLT < 100 K.    03/11/24 2318   03/11/24 2318  Creatinine, serum  (heparin )  Once,   R       Comments: Baseline for heparin  therapy IF NOT ALREADY DRAWN.    03/11/24 2318            Vitals/Pain Today's Vitals   03/11/24 1943 03/11/24 1947 03/11/24 1954 03/12/24 0041  BP:  (!) 177/81  (!) 168/88  Pulse:  (!) 116  (!) 112  Resp:  20  18  Temp:  98.6 F (37 C)  98.4 F (36.9 C)  SpO2:  97%  98%  Weight:      Height:      PainSc: 7   2      Isolation Precautions No active isolations  Medications Medications  verapamil  (CALAN -SR) CR tablet 240 mg (has no administration in time range)  insulin  aspart (novoLOG ) injection 0-9  Units ( Subcutaneous Not Given 03/11/24 2345)  heparin  injection 5,000 Units (5,000 Units Subcutaneous Given 03/12/24 0006)  lactated ringers  infusion ( Intravenous New Bag/Given 03/12/24 0006)  sodium chloride  0.9 % bolus 1,000 mL (0 mLs Intravenous Stopped 03/11/24 2206)  ondansetron  (ZOFRAN ) injection 4 mg (4 mg Intravenous Given 03/11/24 1713)  pantoprazole  (PROTONIX ) injection 40 mg (40 mg Intravenous Given 03/11/24 1714)  prochlorperazine (COMPAZINE) injection 5 mg (5 mg Intravenous Given 03/11/24 1821)  diphenhydrAMINE  (BENADRYL ) injection 25 mg (25 mg Intravenous Given 03/11/24 1821)  iohexol  (OMNIPAQUE )  350 MG/ML injection 75 mL (75 mLs Intravenous Contrast Given 03/11/24 1802)  sodium chloride  0.9 % bolus 1,000 mL (0 mLs Intravenous Stopped 03/11/24 2115)  morphine  (PF) 4 MG/ML injection 4 mg (4 mg Intravenous Given 03/11/24 1943)    Mobility walks     Focused Assessments Cardiac Assessment Handoff:  Cardiac Rhythm: Sinus tachycardia No results found for: CKTOTAL, CKMB, CKMBINDEX, TROPONINI No results found for: DDIMER Does the Patient currently have chest pain? No    R Recommendations: See Admitting Provider Note  Report given to:   Additional Notes: PT admitting for a small bowel obstruction.

## 2024-03-12 NOTE — Progress Notes (Signed)
 The patient was transferred to the floor in a wheelchair,patient alert and oriented,patient refused her tele monitor and provider is aware.Patient  has a Dexcom attached to her left upper arm for glucose monitoring.On call provider informed about orders for the patient,will continue to monitor.

## 2024-03-12 NOTE — Progress Notes (Signed)
   03/12/24 0927  Assess: MEWS Score  Temp 99.7 F (37.6 C)  BP 137/72  MAP (mmHg) 90  Pulse Rate (!) 117  Resp 18  Level of Consciousness Alert  SpO2 97 %  O2 Device Room Air  Assess: MEWS Score  MEWS Temp 0  MEWS Systolic 0  MEWS Pulse 2  MEWS RR 0  MEWS LOC 0  MEWS Score 2  MEWS Score Color Yellow  Assess: if the MEWS score is Yellow or Red  Were vital signs accurate and taken at a resting state? Yes  Does the patient meet 2 or more of the SIRS criteria? No  MEWS guidelines implemented  Yes, yellow  Treat  MEWS Interventions Considered administering scheduled or prn medications/treatments as ordered  Take Vital Signs  Increase Vital Sign Frequency  Yellow: Q2hr x1, continue Q4hrs until patient remains green for 12hrs  Escalate  MEWS: Escalate Yellow: Discuss with charge nurse and consider notifying provider and/or RRT  Notify: Charge Nurse/RN  Name of Charge Nurse/RN Notified [place], RN  Provider Notification  Provider Name/Title Meliton Monte  Date Provider Notified 03/12/24  Time Provider Notified 0940  Method of Notification Page  Notification Reason Other (Comment) (Yellow MEWS)  Assess: SIRS CRITERIA  SIRS Temperature  0  SIRS Respirations  0  SIRS Pulse 1  SIRS WBC 0  SIRS Score Sum  1

## 2024-03-12 NOTE — Progress Notes (Signed)
*  PRELIMINARY RESULTS* Echocardiogram 2D Echocardiogram has been performed.  [name] Stallion 03/12/2024, 12:31 PM

## 2024-03-12 NOTE — Consult Note (Signed)
 [name] [birth year]  [id number].    Requesting MD: Dr. Meliton Monte Chief Complaint/Reason for Consult: SBO  HPI: [name] is a 53 y.o. female with a hx of SVT, A. Fib, DM, PCOS, psoriatic arthritis on Taltz (ixekizumab) with last dose 2 weeks ago who presented to the ED with chest pain and shortness of breath.  Patient reports on Sunday, 6/22 she began having sternal chest pain with radiation to the left chest and between her shoulder blades that was associated with nausea, dizziness and diaphoresis.  Her chest pain was worse with exertion, improved with rest but did not resolve. No associated abdominal pain. This continued into Monday and Tuesday.  On Tuesday morning, 6/24 she began having left upper quadrant abdominal pain, bloating/distention and her nausea was now associated with vomiting.  She also began having nonbloody diarrhea. This prompted her visit to the ED.   Workup significant for: patient is afebrile.  She has been tachycardic since arrival to 128.  On last vital check heart rate 118 s/p 2L IVF and mIVF at 125ml/hr. EKG sinus tach. She reports baseline HR is 100-120. No hypotension is actually hypertensive currently. CT A/P with multiple mildly to moderately dilated loops of jejunum containing gas and fluid without clear transition point. No small bowel thickening, pneumatosis, free air or free fluid.  WBC 12.8.  Creatinine within normal limits.  LFTs within normal limits.  K 3.7.  She reports since admission her chest pain has resolved.  Her LUQ abdominal pain has improved to a 2/10.  She does use NSAIDs on a daily basis.  No alcohol or tobacco use. No history of ulcers or prior endoscopy.  She remains nauseated and had an episode of vomiting 10 - 15 minutes ago.  Her last episode of diarrhea was yesterday afternoon.  She continues to pass flatus.  No history of bowel obstructions in the past.  She has a history of prior tubal ligation and C-section.  Her  last colonoscopy was approximately 26 years ago at [facility] and showed hemorrhoids but was otherwise negative per report (I cannot see these results). No personal or fhx of IBD. She reports personal hx of IBS. She is not on blood thinners. No recent travel, sick contacts, recent antibiotic use or suspicious food intake.  ROS: ROS As above, see hpi  Family History  Problem Relation Age of Onset   Hypertension Mother    Diabetes Mother    Asthma Mother    CAD Father    Diabetes Sister    Diabetes Brother    Hypertension Brother     Past Medical History:  Diagnosis Date   Atrial fibrillation (HCC)    Diabetic retinopathy (HCC)    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)    been dealing w/it for ~ 1 yr (05/20/2018)   Polycystic ovarian syndrome    Psoriasis    Psoriatic arthritis (HCC)    pretty much all over; knees, hands, elbows (05/20/2018)   SVT (supraventricular tachycardia) (HCC)    had 1 chemical conversion here in the ER (05/20/2018)   Type II diabetes mellitus (HCC)     Past Surgical History:  Procedure Laterality Date   AMPUTATION TOE Right 05/22/2018   Procedure: AMPUTATION RIGHT TOE;  Surgeon: Gershon Donnice SAUNDERS, DPM;  Location: [facility] OR;  Service: Podiatry;  Laterality: Right;   CATARACT EXTRACTION W/PHACO Right 01/22/2024   Procedure: PHACOEMULSIFICATION, CATARACT, WITH IOL INSERTION 4.83 00:32.1;  Surgeon: Jaye Fallow,  MD;  Location: [facility];  Service: Ophthalmology;  Laterality: Right;   CESAREAN SECTION  2005   FOOT FRACTURE SURGERY Left    pin put in by Dr. Gershon   FRACTURE SURGERY     TUBAL LIGATION  2005    Social History:  reports that she has never smoked. She has never used smokeless tobacco. She reports that she does not currently use alcohol. She reports that she does not use drugs.  Allergies:  Allergies  Allergen Reactions   Cephalosporins Hives   Apremilast Diarrhea   Ozempic (0.25 Or 0.5 Mg-Dose) [Semaglutide(0.25 Or 0.5mg -Dos)]  Diarrhea and Nausea And Vomiting    Also, pancreatitis   Pneumococcal Vac Polyvalent     Other Reaction(s): swelling, itching, and redness at site of intradermal Pneumovax injection   Synjardy [Empagliflozin-Metformin Hcl] Other (See Comments)    Yeast Infection   Trulicity [Dulaglutide] Diarrhea and Nausea And Vomiting   Ciprofloxacin  Rash   Keflex  [Cephalexin ] Rash   Latex Rash    Exam gloves when worn and when touched by care givers    Medications Prior to Admission  Medication Sig Dispense Refill   acetaminophen  (TYLENOL ) 500 MG tablet Take 1,000 mg by mouth every 6 (six) hours as needed for mild pain or headache.     albuterol  (VENTOLIN  HFA) 108 (90 Base) MCG/ACT inhaler Inhale 2 puffs into the lungs every 6 (six) hours as needed for wheezing or shortness of breath.     Cholecalciferol (VITAMIN D3) 50 MCG (2000 UT) TABS Take 2,000 Units by mouth daily.     escitalopram  (LEXAPRO ) 20 MG tablet Take 20 mg by mouth daily.     estradiol (ESTRACE) 0.1 MG/GM vaginal cream Place 1 Applicatorful vaginally once a week. As needed     HUMULIN  R U-500 KWIKPEN 500 UNIT/ML KwikPen Inject 150-180 Units into the skin in the morning, at noon, and at bedtime. Inject 180 units in morning, 150 units at lunch and dinner     ibuprofen  (ADVIL ,MOTRIN ) 200 MG tablet Take 800 mg by mouth daily as needed (pain).     meloxicam  (MOBIC ) 15 MG tablet Take 15 mg by mouth daily.     norethindrone (AYGESTIN) 5 MG tablet Take 5 mg by mouth daily.     omeprazole (PRILOSEC) 20 MG capsule Take 20 mg by mouth every morning.     prednisoLONE  acetate (PRED FORTE ) 1 % ophthalmic suspension Apply INSTILL ONE DROP INTO THE RIGHT EYE ONCE A DAY 15 mL 5   rosuvastatin  (CRESTOR ) 5 MG tablet Take 5 mg by mouth daily.     TALTZ 80 MG/ML SOAJ Inject 80 mg into the skin every 28 (twenty-eight) days.     verapamil  (CALAN -SR) 240 MG CR tablet TAKE 1 TABLET BY MOUTH AT BEDTIME (Patient taking differently: Take 240 mg by mouth daily.) 15  tablet 0   fluconazole (DIFLUCAN) 150 MG tablet Take 150 mg by mouth as needed (bacterial infection). (Patient not taking: Reported on 03/11/2024)     nystatin-triamcinolone (MYCOLOG II) cream Apply 1 application topically daily as needed (rash/irritation).      prednisoLONE  acetate (PRED FORTE ) 1 % ophthalmic suspension Place 1 drop into the right eye daily. (Patient not taking: Reported on 03/11/2024) 15 mL 5   prednisoLONE  acetate (PRED FORTE ) 1 % ophthalmic suspension Place 1 drop into the right eye 2 (two) times daily. (Patient not taking: Reported on 03/11/2024) 10 mL 5     Physical Exam: Blood pressure (!) 146/66, pulse (!) 118,  temperature 99.2 F (37.3 C), temperature source Oral, resp. rate 18, height 5' 7 (1.702 m), weight 125.3 kg, last menstrual period 05/13/2018, SpO2 96%. General: pleasant, WD/WN female who is laying in bed in NAD HEENT: head is normocephalic, atraumatic.  Sclera are non-icteric.  Heart: regular, rate, and rhythm.   Lungs: CTAB, no wheezes, rhonchi, or rales noted.  Respiratory effort nonlabored Abd: Obese, soft, at least mild distension, epigastric and luq ttp without rigidity or guarding. +BS. Prior C-section scar well healed. No masses, hernias, or organomegaly MS: no BUE or BLE edema Skin: warm and dry  Psych: A&Ox4 with an appropriate affect Neuro: normal speech, thought process intact, moves all extremities, gait not assessed  Results for orders placed or performed during the hospital encounter of 03/11/24 (from the past 48 hours)  CBC with Differential     Status: Abnormal   Collection Time: 03/11/24  3:42 PM  Result Value Ref Range   WBC 12.0 (H) 4.0 - 10.5 K/uL   RBC 4.90 3.87 - 5.11 MIL/uL   Hemoglobin 14.7 12.0 - 15.0 g/dL   HCT 54.6 63.9 - 53.9 %   MCV 92.4 80.0 - 100.0 fL   MCH 30.0 26.0 - 34.0 pg   MCHC 32.5 30.0 - 36.0 g/dL   RDW 85.3 88.4 - 84.4 %   Platelets 335 150 - 400 K/uL   nRBC 0.0 0.0 - 0.2 %   Neutrophils Relative % 84 %    Neutro Abs 10.1 (H) 1.7 - 7.7 K/uL   Lymphocytes Relative 8 %   Lymphs Abs 1.0 0.7 - 4.0 K/uL   Monocytes Relative 6 %   Monocytes Absolute 0.7 0.1 - 1.0 K/uL   Eosinophils Relative 1 %   Eosinophils Absolute 0.1 0.0 - 0.5 K/uL   Basophils Relative 0 %   Basophils Absolute 0.0 0.0 - 0.1 K/uL   Immature Granulocytes 1 %   Abs Immature Granulocytes 0.07 0.00 - 0.07 K/uL    Comment: Performed at [facility], [address], [place], [place] [address]  Comprehensive metabolic panel     Status: Abnormal   Collection Time: 03/11/24  3:42 PM  Result Value Ref Range   Sodium 136 135 - 145 mmol/L   Potassium 3.6 3.5 - 5.1 mmol/L   Chloride 100 98 - 111 mmol/L   CO2 24 22 - 32 mmol/L   Glucose, Bld 170 (H) 70 - 99 mg/dL    Comment: Glucose reference range applies only to samples taken after fasting for at least 8 hours.   BUN 9 6 - 20 mg/dL   Creatinine, Ser 9.45 0.44 - 1.00 mg/dL   Calcium  9.3 8.9 - 10.3 mg/dL   Total Protein 7.9 6.5 - 8.1 g/dL   Albumin 3.5 3.5 - 5.0 g/dL   AST 24 15 - 41 U/L   ALT 22 0 - 44 U/L   Alkaline Phosphatase 57 38 - 126 U/L   Total Bilirubin 0.6 0.0 - 1.2 mg/dL   GFR, Estimated >39 >39 mL/min    Comment: (NOTE) Calculated using the CKD-EPI Creatinine Equation (2021)    Anion gap 12 5 - 15    Comment: Performed at [facility], [address] [place], [place] [address]  Troponin I (High Sensitivity)     Status: None   Collection Time: 03/11/24  3:42 PM  Result Value Ref Range   Troponin I (High Sensitivity) 7 <18 ng/L    Comment: (NOTE) Elevated high sensitivity troponin  I (hsTnI) values and significant  changes across serial measurements may suggest ACS but many other  chronic and acute conditions are known to elevate hsTnI results.  Refer to the Links section for chest pain algorithms and additional  guidance. Performed at [facility], [address], [place], [place] [address]   Brain natriuretic peptide     Status: None    Collection Time: 03/11/24  3:42 PM  Result Value Ref Range   B Natriuretic Peptide 39.4 0.0 - 100.0 pg/mL    Comment: Performed at [facility], [address], [place], [place] [address]  Resp panel by RT-PCR (RSV, Flu A&B, Covid) Anterior Nasal Swab     Status: None   Collection Time: 03/11/24  5:08 PM   Specimen: Anterior Nasal Swab  Result Value Ref Range   SARS Coronavirus 2 by RT PCR NEGATIVE NEGATIVE   Influenza A by PCR NEGATIVE NEGATIVE   Influenza B by PCR NEGATIVE NEGATIVE    Comment: (NOTE) The Xpert Xpress SARS-CoV-2/FLU/RSV plus assay is intended as an aid in the diagnosis of influenza from Nasopharyngeal swab specimens and should not be used as a sole basis for treatment. Nasal washings and aspirates are unacceptable for Xpert Xpress SARS-CoV-2/FLU/RSV testing.  Fact Sheet for Patients: BloggerCourse.com  Fact Sheet for Healthcare Providers: SeriousBroker.it  This test is not yet approved or cleared by the [place]  FDA and has been authorized for detection and/or diagnosis of SARS-CoV-2 by FDA under an Emergency Use Authorization (EUA). This EUA will remain in effect (meaning this test can be used) for the duration of the COVID-19 declaration under Section 564(b)(1) of the Act, 21 U.S.C. section 360bbb-3(b)(1), unless the authorization is terminated or revoked.     Resp Syncytial Virus by PCR NEGATIVE NEGATIVE    Comment: (NOTE) Fact Sheet for Patients: BloggerCourse.com  Fact Sheet for Healthcare Providers: SeriousBroker.it  This test is not yet approved or cleared by the [place]  FDA and has been authorized for detection and/or diagnosis of SARS-CoV-2 by FDA under an Emergency Use Authorization (EUA). This EUA will remain in effect (meaning this test can be used) for the duration of the COVID-19 declaration under Section 564(b)(1) of the Act,  21 U.S.C. section 360bbb-3(b)(1), unless the authorization is terminated or revoked.  Performed at [facility], [address], [place], [place] [address]   Urinalysis, Routine w reflex microscopic -Urine, Clean Catch     Status: Abnormal   Collection Time: 03/11/24  5:08 PM  Result Value Ref Range   Color, Urine YELLOW YELLOW   APPearance HAZY (A) CLEAR   Specific Gravity, Urine 1.016 1.005 - 1.030   pH 5.0 5.0 - 8.0   Glucose, UA 50 (A) NEGATIVE mg/dL   Hgb urine dipstick NEGATIVE NEGATIVE   Bilirubin Urine NEGATIVE NEGATIVE   Ketones, ur NEGATIVE NEGATIVE mg/dL   Protein, ur 30 (A) NEGATIVE mg/dL   Nitrite NEGATIVE NEGATIVE   Leukocytes,Ua NEGATIVE NEGATIVE   RBC / HPF 0-5 0 - 5 RBC/hpf   WBC, UA 0-5 0 - 5 WBC/hpf   Bacteria, UA RARE (A) NONE SEEN   Squamous Epithelial / HPF 0-5 0 - 5 /HPF   Mucus PRESENT     Comment: Performed at [facility], [address], [place], [place] [address]  Troponin I (High Sensitivity)     Status: None   Collection Time: 03/11/24  5:54 PM  Result Value Ref Range   Troponin I (High  Sensitivity) 4 <18 ng/L    Comment: (NOTE) Elevated high sensitivity troponin I (hsTnI) values and significant  changes across serial measurements may suggest ACS but many other  chronic and acute conditions are known to elevate hsTnI results.  Refer to the Links section for chest pain algorithms and additional  guidance. Performed at [facility], [address], [place], [place] [address]   Lipase, blood     Status: None   Collection Time: 03/11/24  5:54 PM  Result Value Ref Range   Lipase 21 11 - 51 U/L    Comment: Performed at [facility], [address] [place], [place] [address]  TSH     Status: None   Collection Time: 03/11/24 10:32 PM  Result Value Ref Range   TSH 4.391 0.350 - 4.500 uIU/mL    Comment: Performed by a 3rd Generation assay with a functional sensitivity of <=0.01 uIU/mL. Performed at [facility]  Lab, [address] [place], [place] [address]   T4, free     Status: None   Collection Time: 03/11/24 10:32 PM  Result Value Ref Range   Free T4 0.88 0.61 - 1.12 ng/dL    Comment: (NOTE) Biotin ingestion may interfere with free T4 tests. If the results are inconsistent with the TSH level, previous test results, or the clinical presentation, then consider biotin interference. If needed, order repeat testing after stopping biotin. Performed at [facility], [address], [place], [place] [address]   CBG monitoring, ED     Status: Abnormal   Collection Time: 03/11/24 11:32 PM  Result Value Ref Range   Glucose-Capillary 116 (H) 70 - 99 mg/dL    Comment: Glucose reference range applies only to samples taken after fasting for at least 8 hours.  Glucose, capillary     Status: Abnormal   Collection Time: 03/12/24  4:25 AM  Result Value Ref Range   Glucose-Capillary 180 (H) 70 - 99 mg/dL    Comment: Glucose reference range applies only to samples taken after fasting for at least 8 hours.  Hemoglobin A1c     Status: Abnormal   Collection Time: 03/12/24  5:39 AM  Result Value Ref Range   Hgb A1c MFr Bld 9.2 (H) 4.8 - 5.6 %    Comment: (NOTE) Diagnosis of Diabetes The following HbA1c ranges recommended by the American Diabetes Association (ADA) may be used as an aid in the diagnosis of diabetes mellitus.  Hemoglobin             Suggested A1C NGSP%              Diagnosis  <5.7                   Non Diabetic  5.7-6.4                Pre-Diabetic  >6.4                   Diabetic  <7.0                   Glycemic control for                       adults with diabetes.     Mean Plasma Glucose 217.34 mg/dL    Comment: Performed at [facility], [address], [place], [place] [address]  CBC     Status: Abnormal  Collection Time: 03/12/24  5:39 AM  Result Value Ref Range   WBC 12.8 (H) 4.0 - 10.5 K/uL   RBC 4.75 3.87 - 5.11 MIL/uL   Hemoglobin 14.5 12.0 - 15.0 g/dL    HCT 56.5 63.9 - 53.9 %   MCV 91.4 80.0 - 100.0 fL   MCH 30.5 26.0 - 34.0 pg   MCHC 33.4 30.0 - 36.0 g/dL   RDW 85.2 88.4 - 84.4 %   Platelets 335 150 - 400 K/uL   nRBC 0.2 0.0 - 0.2 %    Comment: Performed at [facility], [address], [place], [place] [address]  Comprehensive metabolic panel     Status: Abnormal   Collection Time: 03/12/24  5:53 AM  Result Value Ref Range   Sodium 134 (L) 135 - 145 mmol/L   Potassium 3.7 3.5 - 5.1 mmol/L   Chloride 104 98 - 111 mmol/L   CO2 21 (L) 22 - 32 mmol/L   Glucose, Bld 173 (H) 70 - 99 mg/dL    Comment: Glucose reference range applies only to samples taken after fasting for at least 8 hours.   BUN 10 6 - 20 mg/dL   Creatinine, Ser 9.40 0.44 - 1.00 mg/dL   Calcium  8.4 (L) 8.9 - 10.3 mg/dL   Total Protein 7.3 6.5 - 8.1 g/dL   Albumin 3.1 (L) 3.5 - 5.0 g/dL   AST 22 15 - 41 U/L   ALT 18 0 - 44 U/L   Alkaline Phosphatase 58 38 - 126 U/L   Total Bilirubin 0.8 0.0 - 1.2 mg/dL   GFR, Estimated >39 >39 mL/min    Comment: (NOTE) Calculated using the CKD-EPI Creatinine Equation (2021)    Anion gap 9 5 - 15    Comment: Performed at [facility], [address], [place], [place] [address]  Glucose, capillary     Status: Abnormal   Collection Time: 03/12/24  8:19 AM  Result Value Ref Range   Glucose-Capillary 211 (H) 70 - 99 mg/dL    Comment: Glucose reference range applies only to samples taken after fasting for at least 8 hours.   DG Abd 1 View Result Date: 03/12/2024 CLINICAL DATA:  Nausea, vomiting EXAM: ABDOMEN - 1 VIEW COMPARISON:  CT from previous day FINDINGS: Stomach is partially distended. Multiple dilated small bowel loops in the mid abdomen similar in number and degree of dilatation to prior exam. The colon is decompressed. No abnormal abdominal calcifications. Regional bones unremarkable. IMPRESSION: Persistent small bowel obstruction. Electronically Signed   By: JONETTA Faes M.D.   On: 03/12/2024 07:55   CT Angio Chest  PE W and/or Wo Contrast Result Date: 03/11/2024 CLINICAL DATA:  Chest pain and shortness of breath. Epigastric abdominal pain. EXAM: CT ANGIOGRAPHY CHEST CT ABDOMEN AND PELVIS WITH CONTRAST TECHNIQUE: Multidetector CT imaging of the chest was performed using the standard protocol during bolus administration of intravenous contrast. Multiplanar CT image reconstructions and MIPs were obtained to evaluate the vascular anatomy. Multidetector CT imaging of the abdomen and pelvis was performed using the standard protocol during bolus administration of intravenous contrast. RADIATION DOSE REDUCTION: This exam was performed according to the departmental dose-optimization program which includes automated exposure control, adjustment of the mA and/or kV according to patient size and/or use of iterative reconstruction technique. CONTRAST:  75mL OMNIPAQUE  IOHEXOL  350 MG/ML SOLN COMPARISON:  Abdomen and pelvis CT dated 01/16/2020. Portable chest obtained earlier today. FINDINGS: CTA CHEST FINDINGS Cardiovascular: Satisfactory opacification of the  pulmonary arteries to the segmental level. No evidence of pulmonary embolism. Normal heart size. No pericardial effusion. Mediastinum/Nodes: Moderate flattening of the trachea and mainstem bronchi. Unremarkable thyroid gland and esophagus. No enlarged lymph nodes. Lungs/Pleura: Lungs are clear. No pleural effusion or pneumothorax. 3 mm noncalcified nodule in the left upper lobe on image number 40/4. 4 mm perifissural lymph node along the inferior surface of the minor fissure in the right middle lobe on image number 54/4, compatible with a small lymph node. No other lung nodules.  No airspace consolidation or pleural fluid. Musculoskeletal: Thoracic spine degenerative changes. Review of the MIP images confirms the above findings. CT ABDOMEN and PELVIS FINDINGS Hepatobiliary: 2 4 mm gallstones in a contracted gallbladder with no gallbladder wall thickening or pericholecystic fluid.  Unremarkable liver. Pancreas: Moderate diffuse pancreatic atrophy. Spleen: Normal in size without focal abnormality. Adrenals/Urinary Tract: Adrenal glands are unremarkable. Kidneys are normal, without renal calculi, focal lesion, or hydronephrosis. Bladder is unremarkable. Stomach/Bowel: Multiple mildly to moderately dilated loops of jejunum containing gas and fluid. No wall thickening, abnormal enhancement or pneumatosis. The stomach, duodenum and distal small bowel are normal in caliber. The transition point is difficult to localize. No obstructing mass visualized. Small, normal-appearing appendix. Unremarkable colon. Vascular/Lymphatic: No significant vascular findings are present. No enlarged abdominal or pelvic lymph nodes. Reproductive: Uterus and bilateral adnexa are unremarkable. Other: No abdominal wall hernia or abnormality. No abdominopelvic ascites. Musculoskeletal: Mild left and minimal right hip degenerative changes. Mild lumbar and moderate lower thoracic spine degenerative changes. Review of the MIP images confirms the above findings. IMPRESSION: 1. No pulmonary embolism or acute thoracic abnormality. 2. Moderate flattening of the trachea and mainstem bronchi, compatible with tracheobronchomalacia. 3. 3 mm noncalcified nodule in the left upper lobe. No follow-up needed if patient is low-risk.This recommendation follows the consensus statement: Guidelines for Management of Incidental Pulmonary Nodules Detected on CT Images: From the Fleischner Society 2017; Radiology 2017; 284:228-243. 4. Multiple mildly to moderately dilated loops of jejunum containing gas and fluid. The transition point is difficult to localize. No obstructing mass visualized. This could represent a partial small bowel obstruction or adynamic ileus. Electronically Signed   By: Elspeth Bathe M.D.   On: 03/11/2024 18:33   CT ABDOMEN PELVIS W CONTRAST Result Date: 03/11/2024 CLINICAL DATA:  Chest pain and shortness of breath.  Epigastric abdominal pain. EXAM: CT ANGIOGRAPHY CHEST CT ABDOMEN AND PELVIS WITH CONTRAST TECHNIQUE: Multidetector CT imaging of the chest was performed using the standard protocol during bolus administration of intravenous contrast. Multiplanar CT image reconstructions and MIPs were obtained to evaluate the vascular anatomy. Multidetector CT imaging of the abdomen and pelvis was performed using the standard protocol during bolus administration of intravenous contrast. RADIATION DOSE REDUCTION: This exam was performed according to the departmental dose-optimization program which includes automated exposure control, adjustment of the mA and/or kV according to patient size and/or use of iterative reconstruction technique. CONTRAST:  75mL OMNIPAQUE  IOHEXOL  350 MG/ML SOLN COMPARISON:  Abdomen and pelvis CT dated 01/16/2020. Portable chest obtained earlier today. FINDINGS: CTA CHEST FINDINGS Cardiovascular: Satisfactory opacification of the pulmonary arteries to the segmental level. No evidence of pulmonary embolism. Normal heart size. No pericardial effusion. Mediastinum/Nodes: Moderate flattening of the trachea and mainstem bronchi. Unremarkable thyroid gland and esophagus. No enlarged lymph nodes. Lungs/Pleura: Lungs are clear. No pleural effusion or pneumothorax. 3 mm noncalcified nodule in the left upper lobe on image number 40/4. 4 mm perifissural lymph node along the inferior surface of the minor  fissure in the right middle lobe on image number 54/4, compatible with a small lymph node. No other lung nodules.  No airspace consolidation or pleural fluid. Musculoskeletal: Thoracic spine degenerative changes. Review of the MIP images confirms the above findings. CT ABDOMEN and PELVIS FINDINGS Hepatobiliary: 2 4 mm gallstones in a contracted gallbladder with no gallbladder wall thickening or pericholecystic fluid. Unremarkable liver. Pancreas: Moderate diffuse pancreatic atrophy. Spleen: Normal in size without focal  abnormality. Adrenals/Urinary Tract: Adrenal glands are unremarkable. Kidneys are normal, without renal calculi, focal lesion, or hydronephrosis. Bladder is unremarkable. Stomach/Bowel: Multiple mildly to moderately dilated loops of jejunum containing gas and fluid. No wall thickening, abnormal enhancement or pneumatosis. The stomach, duodenum and distal small bowel are normal in caliber. The transition point is difficult to localize. No obstructing mass visualized. Small, normal-appearing appendix. Unremarkable colon. Vascular/Lymphatic: No significant vascular findings are present. No enlarged abdominal or pelvic lymph nodes. Reproductive: Uterus and bilateral adnexa are unremarkable. Other: No abdominal wall hernia or abnormality. No abdominopelvic ascites. Musculoskeletal: Mild left and minimal right hip degenerative changes. Mild lumbar and moderate lower thoracic spine degenerative changes. Review of the MIP images confirms the above findings. IMPRESSION: 1. No pulmonary embolism or acute thoracic abnormality. 2. Moderate flattening of the trachea and mainstem bronchi, compatible with tracheobronchomalacia. 3. 3 mm noncalcified nodule in the left upper lobe. No follow-up needed if patient is low-risk.This recommendation follows the consensus statement: Guidelines for Management of Incidental Pulmonary Nodules Detected on CT Images: From the Fleischner Society 2017; Radiology 2017; 284:228-243. 4. Multiple mildly to moderately dilated loops of jejunum containing gas and fluid. The transition point is difficult to localize. No obstructing mass visualized. This could represent a partial small bowel obstruction or adynamic ileus. Electronically Signed   By: Elspeth Bathe M.D.   On: 03/11/2024 18:33   DG Chest Portable 1 View Result Date: 03/11/2024 CLINICAL DATA:  chest pain EXAM: PORTABLE CHEST - 1 VIEW COMPARISON:  05/17/2018 FINDINGS: Lungs are clear.  No pneumothorax. Heart size and mediastinal contours are  within normal limits. No effusion. Visualized bones unremarkable. IMPRESSION: No acute cardiopulmonary disease. Electronically Signed   By: JONETTA Faes M.D.   On: 03/11/2024 17:22   VAS US  LOWER EXTREMITY VENOUS (DVT) (7a-7p) Result Date: 03/11/2024  Lower Venous DVT Study Patient Name:  [name]  Date of Exam:   03/11/2024 Medical Rec #: [id number]           Accession #:    [id number] Date of Birth: 11-25-1970           Patient Gender: F Patient Age:   53 years Exam Location:  [facility] Procedure:      VAS US  LOWER EXTREMITY VENOUS (DVT) Referring Phys: AMJAD ALI --------------------------------------------------------------------------------  Indications: Swelling, and Edema.  Performing Technologist: Elmarie Lindau, RVT  Examination Guidelines: A complete evaluation includes B-mode imaging, spectral Doppler, color Doppler, and power Doppler as needed of all accessible portions of each vessel. Bilateral testing is considered an integral part of a complete examination. Limited examinations for reoccurring indications may be performed as noted. The reflux portion of the exam is performed with the patient in reverse Trendelenburg.  +---------+---------------+---------+-----------+----------+-------------------+ RIGHT    CompressibilityPhasicitySpontaneityPropertiesThrombus Aging      +---------+---------------+---------+-----------+----------+-------------------+ CFV      Full           Yes      Yes                                      +---------+---------------+---------+-----------+----------+-------------------+  SFJ      Full                                                             +---------+---------------+---------+-----------+----------+-------------------+ FV Prox  Full                                                             +---------+---------------+---------+-----------+----------+-------------------+ FV Mid   Full                                                              +---------+---------------+---------+-----------+----------+-------------------+ FV DistalFull                                                             +---------+---------------+---------+-----------+----------+-------------------+ PFV      Full                                                             +---------+---------------+---------+-----------+----------+-------------------+ POP      Full           Yes      Yes                                      +---------+---------------+---------+-----------+----------+-------------------+ PTV      Full                                                             +---------+---------------+---------+-----------+----------+-------------------+ PERO                                                  Not well visualized +---------+---------------+---------+-----------+----------+-------------------+     Summary: RIGHT: - There is no evidence of deep vein thrombosis in the lower extremity. However, portions of this examination were limited- see technologist comments above.  - No cystic structure found in the popliteal fossa.   *See table(s) above for measurements and observations. Electronically signed by Penne Colorado MD on 03/11/2024 at 5:03:47 PM.    Final     Anti-infectives (From admission, onward)    None       Assessment/Plan pSBO -  CT w/ CT A/P with multiple mildly to moderately dilated loops of jejunum containing gas and fluid without clear transition point. No history of bowel obstructions in the past.  She has a history of prior tubal ligation and C-section.  - Patient is currently afebrile without hypotension.  She has noted to be tachycardic despite IV fluids but reports her heart rate is normally between 100-120.  On chart review this does seem consistent with this.  No peritonitis on exam.  WBC is elevated at 12.8.  No obvious pneumonia on chest imaging or UTI on UA to explain  leukocytosis. Would like to watch this closely. There was no small bowel thickening, pneumatosis, free air or free fluid on CT; she has no peritonitis on exam; reports improvement of her symptoms and is having some bowel function. Given this I think be reasonable to start with nonoperative management of her SBO with close monitoring giving her tachycardia and leukocytosis and a lower threshold for OR if things were to fail to improve/worsen. - Place NGT for decompression and keep NPO - Start SBO protocol - Keep K >=4, Phos >= 3, Mg >= 2 and mobilize for bowel function. Okay to clamp NGT for mobilization.  - Hopefully patient will improve with conservative management. If patient fails to improve with conservative management, they may require exploratory surgery during admission - Agree with stool studies  - Agree with medical admission. We will follow with you.  FEN - NPO, place NGT, IVF per [facility] VTE - SCDs, okay for chem ppx from a general surgery standpoint ID - None  - Per [facility] -  Chest pain - Primary getting echo. Tn wnl x 2 Hx SVT Hx A. Fib DM PCOS Psoriatic arthritis on Taltz (ixekizumab)  Incidental findings - tracheobronchomalacia. LUL nodule.   I reviewed nursing notes, hospitalist notes, last 24 h vitals and pain scores, last 48 h intake and output, last 24 h labs and trends, and last 24 h imaging results.  [name], [facility] 03/12/2024, 9:21 AM Please see Amion for pager number during day hours 7:00am-4:30pm

## 2024-03-12 NOTE — Progress Notes (Signed)
 Pt arrived back to 6 north room 4 from xray

## 2024-03-12 NOTE — Progress Notes (Signed)
Pt transported to xray via bed by transportation staff. 

## 2024-03-12 NOTE — Progress Notes (Signed)
 PROGRESS NOTE    [name]  FMW:[id number] DOB: 1970/12/05 DOA: 03/11/2024 PCP: Dwight Trula SQUIBB, MD  Chief Complaint  Patient presents with   Chest Pain   Shortness of Breath    Brief Narrative:   [name] is [name] 53 y.o. female with history of SVT, diabetes mellitus type 2, psoriatic arthritis presents to the ER with complaints of nausea vomiting chest pain and diarrhea.  Patient's symptoms started this morning.  Pain was in left side of the chest and subsequently started having multiple episodes of vomiting and also had watery diarrhea.  Denies any recent use of antibiotics.  Patient did have surgery for the right eye 3 weeks ago.  Patient subsequently started having abdominal discomfort.   Assessment & Plan:   Principal Problem:   SBO (small bowel obstruction) (HCC) Active Problems:   Insulin -requiring or dependent type II diabetes mellitus (HCC)   Psoriatic arthritis (HCC)   SVT (supraventricular tachycardia) (HCC)  Small Bowel Obstruction CT with multiple mildly to moderately dilated loops of jejunum containing gas and fluid, difficult to localize transition point SBO protocol  Plain films show obstruction Now s/p NGT placement  Appreciate surgery assistance - K>4, phos>3, mg >2 - mobilize.    Nausea  Vomiting  Diarrhea Stool studies pending   Chest Discomfort No current complaints - suspect related to above Troponin negative x2, echo without WMA  Sinus Tachycardia Hx SVT No comparisons that I see prior to 2019 for EKG's TSH wnl She notes hx of tachycardia  Echo with preserved EF Continue verapamil  as able   T2DM Takes u500 insulin   Currently on significantly less than reported 150-180 units breakfast, lunch, and dinner.  Attention to BG's at discharge and consider reduction in regimen if appropriate.   SSI  Psoriatic Arthritis Takes taltz     DVT prophylaxis: heparin  Code Status: full Family Communication: son at bedside Disposition:    Status is: Inpatient Remains inpatient appropriate because: need for ongoing care   Consultants:  surgery  Procedures:  none  Antimicrobials:  Anti-infectives (From admission, onward)    None       Subjective: No new complaints, feels better after NGT placed  Objective: Vitals:   03/12/24 0927 03/12/24 1030 03/12/24 1240 03/12/24 1616  BP: 137/72 132/78 (!) 146/79 117/62  Pulse: (!) 117 (!) 115 (!) 110 (!) 105  Resp: 18 19 17 18   Temp: 99.7 F (37.6 C)   99.5 F (37.5 C)  TempSrc: Oral   Oral  SpO2: 97% 98% 97% 98%  Weight:      Height:        Intake/Output Summary (Last 24 hours) at 03/12/2024 1634 Last data filed at 03/12/2024 1535 Gross per 24 hour  Intake 2040.05 ml  Output --  Net 2040.05 ml   Filed Weights   03/11/24 1529 03/12/24 0314  Weight: 113.4 kg 125.3 kg    Examination:  General exam: Appears calm and comfortable  Respiratory system: Clear to auscultation. Respiratory effort normal. Cardiovascular system: tachycardic, regular (she notes chronic tachy) Gastrointestinal system: protuberant, s/nt/nd Central nervous system: Alert and oriented. No focal neurological deficits. Extremities: no LEE    Data Reviewed: I have personally reviewed following labs and imaging studies  CBC: Recent Labs  Lab 03/11/24 1542 03/12/24 0539  WBC 12.0* 12.8*  NEUTROABS 10.1*  --   HGB 14.7 14.5  HCT 45.3 43.4  MCV 92.4 91.4  PLT 335 335    Basic Metabolic Panel: Recent Labs  Lab 03/11/24 1542 03/12/24 0553  NA 136 134*  K 3.6 3.7  CL 100 104  CO2 24 21*  GLUCOSE 170* 173*  BUN 9 10  CREATININE 0.54 0.59  CALCIUM  9.3 8.4*    GFR: Estimated Creatinine Clearance: 111.8 mL/min (by C-G formula based on SCr of 0.59 mg/dL).  Liver Function Tests: Recent Labs  Lab 03/11/24 1542 03/12/24 0553  AST 24 22  ALT 22 18  ALKPHOS 57 58  BILITOT 0.6 0.8  PROT 7.9 7.3  ALBUMIN 3.5 3.1*    CBG: Recent Labs  Lab 03/11/24 2332  03/12/24 0425 03/12/24 0819 03/12/24 1203 03/12/24 1615  GLUCAP 116* 180* 211* 247* 223*     Recent Results (from the past 240 hours)  Resp panel by RT-PCR (RSV, Flu [name]&B, Covid) Anterior Nasal Swab     Status: None   Collection Time: 03/11/24  5:08 PM   Specimen: Anterior Nasal Swab  Result Value Ref Range Status   SARS Coronavirus 2 by RT PCR NEGATIVE NEGATIVE Final   Influenza [name] by PCR NEGATIVE NEGATIVE Final   Influenza B by PCR NEGATIVE NEGATIVE Final    Comment: (NOTE) The Xpert Xpress SARS-CoV-2/FLU/RSV plus assay is intended as an aid in the diagnosis of influenza from Nasopharyngeal swab specimens and should not be used as [name] sole basis for treatment. Nasal washings and aspirates are unacceptable for Xpert Xpress SARS-CoV-2/FLU/RSV testing.  Fact Sheet for Patients: BloggerCourse.com  Fact Sheet for Healthcare Providers: SeriousBroker.it  This test is not yet approved or cleared by the [place]  FDA and has been authorized for detection and/or diagnosis of SARS-CoV-2 by FDA under an Emergency Use Authorization (EUA). This EUA will remain in effect (meaning this test can be used) for the duration of the COVID-19 declaration under Section 564(b)(1) of the Act, 21 U.S.C. section 360bbb-3(b)(1), unless the authorization is terminated or revoked.     Resp Syncytial Virus by PCR NEGATIVE NEGATIVE Final    Comment: (NOTE) Fact Sheet for Patients: BloggerCourse.com  Fact Sheet for Healthcare Providers: SeriousBroker.it  This test is not yet approved or cleared by the [place]  FDA and has been authorized for detection and/or diagnosis of SARS-CoV-2 by FDA under an Emergency Use Authorization (EUA). This EUA will remain in effect (meaning this test can be used) for the duration of the COVID-19 declaration under Section 564(b)(1) of the Act, 21 U.S.C. section  360bbb-3(b)(1), unless the authorization is terminated or revoked.  Performed at [facility], [address] [place], [place] [address]          Radiology Studies: ECHOCARDIOGRAM COMPLETE Result Date: 03/12/2024    ECHOCARDIOGRAM REPORT   Patient Name:   [name] Date of Exam: 03/12/2024 Medical Rec #:  [id number]          Height:       67.0 in Accession #:    [id number]         Weight:       276.2 lb Date of Birth:  13-Aug-1971          BSA:          2.320 m Patient Age:    53 years           BP:           140/66 mmHg Patient Gender: F                  HR:  112 bpm. Exam Location:  Inpatient Procedure: Color Doppler, Cardiac Doppler and 2D Echo (Both Spectral and Color            Flow Doppler were utilized during procedure). Indications:    chest pain  History:        Patient has prior history of Echocardiogram examinations, most                 recent 03/24/2013.  Sonographer:    [name] Referring Phys: [id number] ARSHAD N KAKRAKANDY IMPRESSIONS  1. Left ventricular ejection fraction, by estimation, is 65 to 70%. The left ventricle has normal function. The left ventricle has no regional wall motion abnormalities. Left ventricular diastolic parameters were normal.  2. Right ventricular systolic function is normal. The right ventricular size is normal.  3. The mitral valve is grossly normal. No evidence of mitral valve regurgitation. No evidence of mitral stenosis.  4. The aortic valve is normal in structure. Aortic valve regurgitation is not visualized. No aortic stenosis is present.  5. The inferior vena cava is normal in size with greater than 50% respiratory variability, suggesting right atrial pressure of 3 mmHg. FINDINGS  Left Ventricle: Left ventricular ejection fraction, by estimation, is 65 to 70%. The left ventricle has normal function. The left ventricle has no regional wall motion abnormalities. The left ventricular internal cavity size was normal in size. There is   no left ventricular hypertrophy. Left ventricular diastolic function could not be evaluated due to nondiagnostic images. Left ventricular diastolic parameters were normal. Right Ventricle: The right ventricular size is normal. No increase in right ventricular wall thickness. Right ventricular systolic function is normal. Left Atrium: Left atrial size was normal in size. Right Atrium: Right atrial size was normal in size. Pericardium: There is no evidence of pericardial effusion. Mitral Valve: The mitral valve is grossly normal. No evidence of mitral valve regurgitation. No evidence of mitral valve stenosis. Tricuspid Valve: The tricuspid valve is not well visualized. Tricuspid valve regurgitation is not demonstrated. No evidence of tricuspid stenosis. Aortic Valve: The aortic valve is normal in structure. Aortic valve regurgitation is not visualized. No aortic stenosis is present. Aortic valve mean gradient measures 8.0 mmHg. Aortic valve peak gradient measures 13.7 mmHg. Aortic valve area, by VTI measures 2.33 cm. Pulmonic Valve: The pulmonic valve was not well visualized. Pulmonic valve regurgitation is not visualized. No evidence of pulmonic stenosis. Aorta: The aortic root is normal in size and structure. Venous: The inferior vena cava is normal in size with greater than 50% respiratory variability, suggesting right atrial pressure of 3 mmHg. IAS/Shunts: No atrial level shunt detected by color flow Doppler.  LEFT VENTRICLE PLAX 2D LVIDd:         4.10 cm   Diastology LVIDs:         2.70 cm   LV e' medial:    6.84 cm/s LV PW:         1.00 cm   LV E/e' medial:  14.8 LV IVS:        1.00 cm   LV e' lateral:   9.01 cm/s LVOT diam:     2.00 cm   LV E/e' lateral: 11.2 LV SV:         66 LV SV Index:   28 LVOT Area:     3.14 cm  RIGHT VENTRICLE RV S prime:     16.20 cm/s TAPSE (M-mode): 2.9 cm LEFT ATRIUM  Index        RIGHT ATRIUM           Index LA diam:        3.60 cm 1.55 cm/m   RA Area:     14.90 cm  LA Vol (A2C):   17.8 ml 7.67 ml/m   RA Volume:   35.60 ml  15.35 ml/m LA Vol (A4C):   41.5 ml 17.89 ml/m LA Biplane Vol: 27.9 ml 12.03 ml/m  AORTIC VALVE AV Area (Vmax):    2.17 cm AV Area (Vmean):   2.13 cm AV Area (VTI):     2.33 cm AV Vmax:           185.00 cm/s AV Vmean:          129.333 cm/s AV VTI:            0.281 m AV Peak Grad:      13.7 mmHg AV Mean Grad:      8.0 mmHg LVOT Vmax:         128.00 cm/s LVOT Vmean:        87.800 cm/s LVOT VTI:          0.209 m LVOT/AV VTI ratio: 0.74  AORTA Ao Root diam: 2.40 cm MITRAL VALVE MV Area (PHT): 5.02 cm     SHUNTS MV Decel Time: 151 msec     Systemic VTI:  0.21 m MV E velocity: 101.00 cm/s  Systemic Diam: 2.00 cm MV [name] velocity: 154.00 cm/s MV E/Kendria Halberg ratio:  0.66 [name] Electronically signed by [name] Signature Date/Time: 03/12/2024/3:31:54 PM    Final    DG Abd Portable 1V-Small Bowel Protocol-Position Verification Result Date: 03/12/2024 CLINICAL DATA:  Nasogastric tube placement. EXAM: PORTABLE ABDOMEN - 1 VIEW COMPARISON:  03/12/2024. FINDINGS: Nasogastric tube terminates in the cardiac portion of the stomach with the side port well beyond the gastroesophageal junction. Gaseous distention of small bowel, partially imaged. IMPRESSION: 1. Nasogastric tube terminates in the stomach. 2. Small bowel obstruction. Electronically Signed   By: Newell Terri M.D.   On: 03/12/2024 15:15   DG Abd 1 View Result Date: 03/12/2024 CLINICAL DATA:  Nausea, vomiting EXAM: ABDOMEN - 1 VIEW COMPARISON:  CT from previous day FINDINGS: Stomach is partially distended. Multiple dilated small bowel loops in the mid abdomen similar in number and degree of dilatation to prior exam. The colon is decompressed. No abnormal abdominal calcifications. Regional bones unremarkable. IMPRESSION: Persistent small bowel obstruction. Electronically Signed   By: Terri Faes M.D.   On: 03/12/2024 07:55   CT Angio Chest PE W and/or Wo Contrast Result Date:  03/11/2024 CLINICAL DATA:  Chest pain and shortness of breath. Epigastric abdominal pain. EXAM: CT ANGIOGRAPHY CHEST CT ABDOMEN AND PELVIS WITH CONTRAST TECHNIQUE: Multidetector CT imaging of the chest was performed using the standard protocol during bolus administration of intravenous contrast. Multiplanar CT image reconstructions and MIPs were obtained to evaluate the vascular anatomy. Multidetector CT imaging of the abdomen and pelvis was performed using the standard protocol during bolus administration of intravenous contrast. RADIATION DOSE REDUCTION: This exam was performed according to the departmental dose-optimization program which includes automated exposure control, adjustment of the mA and/or kV according to patient size and/or use of iterative reconstruction technique. CONTRAST:  75mL OMNIPAQUE  IOHEXOL  350 MG/ML SOLN COMPARISON:  Abdomen and pelvis CT dated 01/16/2020. Portable chest obtained earlier today. FINDINGS: CTA CHEST FINDINGS Cardiovascular: Satisfactory opacification of the pulmonary arteries to the segmental level. No evidence of pulmonary  embolism. Normal heart size. No pericardial effusion. Mediastinum/Nodes: Moderate flattening of the trachea and mainstem bronchi. Unremarkable thyroid gland and esophagus. No enlarged lymph nodes. Lungs/Pleura: Lungs are clear. No pleural effusion or pneumothorax. 3 mm noncalcified nodule in the left upper lobe on image number 40/4. 4 mm perifissural lymph node along the inferior surface of the minor fissure in the right middle lobe on image number 54/4, compatible with Houa Ackert small lymph node. No other lung nodules.  No airspace consolidation or pleural fluid. Musculoskeletal: Thoracic spine degenerative changes. Review of the MIP images confirms the above findings. CT ABDOMEN and PELVIS FINDINGS Hepatobiliary: 2 4 mm gallstones in Jarika Robben contracted gallbladder with no gallbladder wall thickening or pericholecystic fluid. Unremarkable liver. Pancreas: Moderate  diffuse pancreatic atrophy. Spleen: Normal in size without focal abnormality. Adrenals/Urinary Tract: Adrenal glands are unremarkable. Kidneys are normal, without renal calculi, focal lesion, or hydronephrosis. Bladder is unremarkable. Stomach/Bowel: Multiple mildly to moderately dilated loops of jejunum containing gas and fluid. No wall thickening, abnormal enhancement or pneumatosis. The stomach, duodenum and distal small bowel are normal in caliber. The transition point is difficult to localize. No obstructing mass visualized. Small, normal-appearing appendix. Unremarkable colon. Vascular/Lymphatic: No significant vascular findings are present. No enlarged abdominal or pelvic lymph nodes. Reproductive: Uterus and bilateral adnexa are unremarkable. Other: No abdominal wall hernia or abnormality. No abdominopelvic ascites. Musculoskeletal: Mild left and minimal right hip degenerative changes. Mild lumbar and moderate lower thoracic spine degenerative changes. Review of the MIP images confirms the above findings. IMPRESSION: 1. No pulmonary embolism or acute thoracic abnormality. 2. Moderate flattening of the trachea and mainstem bronchi, compatible with tracheobronchomalacia. 3. 3 mm noncalcified nodule in the left upper lobe. No follow-up needed if patient is low-risk.This recommendation follows the consensus statement: Guidelines for Management of Incidental Pulmonary Nodules Detected on CT Images: From the Fleischner Society 2017; Radiology 2017; 284:228-243. 4. Multiple mildly to moderately dilated loops of jejunum containing gas and fluid. The transition point is difficult to localize. No obstructing mass visualized. This could represent Everard Interrante partial small bowel obstruction or adynamic ileus. Electronically Signed   By: Terri Bathe M.D.   On: 03/11/2024 18:33   CT ABDOMEN PELVIS W CONTRAST Result Date: 03/11/2024 CLINICAL DATA:  Chest pain and shortness of breath. Epigastric abdominal pain. EXAM: CT  ANGIOGRAPHY CHEST CT ABDOMEN AND PELVIS WITH CONTRAST TECHNIQUE: Multidetector CT imaging of the chest was performed using the standard protocol during bolus administration of intravenous contrast. Multiplanar CT image reconstructions and MIPs were obtained to evaluate the vascular anatomy. Multidetector CT imaging of the abdomen and pelvis was performed using the standard protocol during bolus administration of intravenous contrast. RADIATION DOSE REDUCTION: This exam was performed according to the departmental dose-optimization program which includes automated exposure control, adjustment of the mA and/or kV according to patient size and/or use of iterative reconstruction technique. CONTRAST:  75mL OMNIPAQUE  IOHEXOL  350 MG/ML SOLN COMPARISON:  Abdomen and pelvis CT dated 01/16/2020. Portable chest obtained earlier today. FINDINGS: CTA CHEST FINDINGS Cardiovascular: Satisfactory opacification of the pulmonary arteries to the segmental level. No evidence of pulmonary embolism. Normal heart size. No pericardial effusion. Mediastinum/Nodes: Moderate flattening of the trachea and mainstem bronchi. Unremarkable thyroid gland and esophagus. No enlarged lymph nodes. Lungs/Pleura: Lungs are clear. No pleural effusion or pneumothorax. 3 mm noncalcified nodule in the left upper lobe on image number 40/4. 4 mm perifissural lymph node along the inferior surface of the minor fissure in the right middle lobe on image number 54/4,  compatible with [name] small lymph node. No other lung nodules.  No airspace consolidation or pleural fluid. Musculoskeletal: Thoracic spine degenerative changes. Review of the MIP images confirms the above findings. CT ABDOMEN and PELVIS FINDINGS Hepatobiliary: 2 4 mm gallstones in [name] contracted gallbladder with no gallbladder wall thickening or pericholecystic fluid. Unremarkable liver. Pancreas: Moderate diffuse pancreatic atrophy. Spleen: Normal in size without focal abnormality. Adrenals/Urinary Tract:  Adrenal glands are unremarkable. Kidneys are normal, without renal calculi, focal lesion, or hydronephrosis. Bladder is unremarkable. Stomach/Bowel: Multiple mildly to moderately dilated loops of jejunum containing gas and fluid. No wall thickening, abnormal enhancement or pneumatosis. The stomach, duodenum and distal small bowel are normal in caliber. The transition point is difficult to localize. No obstructing mass visualized. Small, normal-appearing appendix. Unremarkable colon. Vascular/Lymphatic: No significant vascular findings are present. No enlarged abdominal or pelvic lymph nodes. Reproductive: Uterus and bilateral adnexa are unremarkable. Other: No abdominal wall hernia or abnormality. No abdominopelvic ascites. Musculoskeletal: Mild left and minimal right hip degenerative changes. Mild lumbar and moderate lower thoracic spine degenerative changes. Review of the MIP images confirms the above findings. IMPRESSION: 1. No pulmonary embolism or acute thoracic abnormality. 2. Moderate flattening of the trachea and mainstem bronchi, compatible with tracheobronchomalacia. 3. 3 mm noncalcified nodule in the left upper lobe. No follow-up needed if patient is low-risk.This recommendation follows the consensus statement: Guidelines for Management of Incidental Pulmonary Nodules Detected on CT Images: From the Fleischner Society 2017; Radiology 2017; 284:228-243. 4. Multiple mildly to moderately dilated loops of jejunum containing gas and fluid. The transition point is difficult to localize. No obstructing mass visualized. This could represent Meilah Delrosario partial small bowel obstruction or adynamic ileus. Electronically Signed   By: Terri Bathe M.D.   On: 03/11/2024 18:33   DG Chest Portable 1 View Result Date: 03/11/2024 CLINICAL DATA:  chest pain EXAM: PORTABLE CHEST - 1 VIEW COMPARISON:  05/17/2018 FINDINGS: Lungs are clear.  No pneumothorax. Heart size and mediastinal contours are within normal limits. No effusion.  Visualized bones unremarkable. IMPRESSION: No acute cardiopulmonary disease. Electronically Signed   By: Terri Faes M.D.   On: 03/11/2024 17:22   VAS US  LOWER EXTREMITY VENOUS (DVT) (7a-7p) Result Date: 03/11/2024  Lower Venous DVT Study Patient Name:  [name]  Date of Exam:   03/11/2024 Medical Rec #: [id number]           Accession #:    [id number] Date of Birth: [birth year]           Patient Gender: F Patient Age:   14 years Exam Location:  [facility] Procedure:      VAS US  LOWER EXTREMITY VENOUS (DVT) Referring Phys: AMJAD ALI --------------------------------------------------------------------------------  Indications: Swelling, and Edema.  Performing Technologist: [name], [name]  Examination Guidelines: Tessah Patchen complete evaluation includes B-mode imaging, spectral Doppler, color Doppler, and power Doppler as needed of all accessible portions of each vessel. Bilateral testing is considered an integral part of Drake Wuertz complete examination. Limited examinations for reoccurring indications may be performed as noted. The reflux portion of the exam is performed with the patient in reverse Trendelenburg.  +---------+---------------+---------+-----------+----------+-------------------+ RIGHT    CompressibilityPhasicitySpontaneityPropertiesThrombus Aging      +---------+---------------+---------+-----------+----------+-------------------+ CFV      Full           Yes      Yes                                      +---------+---------------+---------+-----------+----------+-------------------+  SFJ      Full                                                             +---------+---------------+---------+-----------+----------+-------------------+ FV Prox  Full                                                             +---------+---------------+---------+-----------+----------+-------------------+ FV Mid   Full                                                              +---------+---------------+---------+-----------+----------+-------------------+ FV DistalFull                                                             +---------+---------------+---------+-----------+----------+-------------------+ PFV      Full                                                             +---------+---------------+---------+-----------+----------+-------------------+ POP      Full           Yes      Yes                                      +---------+---------------+---------+-----------+----------+-------------------+ PTV      Full                                                             +---------+---------------+---------+-----------+----------+-------------------+ PERO                                                  Not well visualized +---------+---------------+---------+-----------+----------+-------------------+     Summary: RIGHT: - There is no evidence of deep vein thrombosis in the lower extremity. However, portions of this examination were limited- see technologist comments above.  - No cystic structure found in the popliteal fossa.   *See table(s) above for measurements and observations. Electronically signed by Penne Colorado MD on 03/11/2024 at 5:03:47 PM.    Final         Scheduled Meds:  diatrizoate meglumine-sodium  90 mL Per NG tube Once  heparin   5,000 Units Subcutaneous Q8H   insulin  aspart  0-9 Units Subcutaneous Q4H   prednisoLONE  acetate  1 drop Right Eye Daily   verapamil   240 mg Oral Daily   Continuous Infusions:  lactated ringers  125 mL/hr at 03/12/24 0717     LOS: 0 days    Time spent: over 30 min    Meliton Monte, MD [facility]   To contact the attending provider between 7A-7P or the covering provider during after hours 7P-7A, please log into the web site [email or web address] and access using universal [employer] password for that web site. If you do not have the password, please call the hospital  operator.  03/12/2024, 4:34 PM

## 2024-03-12 NOTE — Progress Notes (Signed)
 NGT hooked to LIWS by surgery MD

## 2024-03-13 ENCOUNTER — Inpatient Hospital Stay (HOSPITAL_COMMUNITY)

## 2024-03-13 DIAGNOSIS — K56609 Unspecified intestinal obstruction, unspecified as to partial versus complete obstruction: Secondary | ICD-10-CM | POA: Diagnosis not present

## 2024-03-13 LAB — COMPREHENSIVE METABOLIC PANEL WITH GFR
ALT: 22 U/L (ref 0–44)
AST: 23 U/L (ref 15–41)
Albumin: 2.8 g/dL — ABNORMAL LOW (ref 3.5–5.0)
Alkaline Phosphatase: 53 U/L (ref 38–126)
Anion gap: 9 (ref 5–15)
BUN: 9 mg/dL (ref 6–20)
CO2: 23 mmol/L (ref 22–32)
Calcium: 8 mg/dL — ABNORMAL LOW (ref 8.9–10.3)
Chloride: 106 mmol/L (ref 98–111)
Creatinine, Ser: 0.59 mg/dL (ref 0.44–1.00)
GFR, Estimated: >60 mL/min (ref >60)
Glucose, Bld: 221 mg/dL — ABNORMAL HIGH (ref 70–99)
Potassium: 3.5 mmol/L (ref 3.5–5.1)
Sodium: 138 mmol/L (ref 135–145)
Total Bilirubin: 0.9 mg/dL (ref 0.0–1.2)
Total Protein: 6.6 g/dL (ref 6.5–8.1)

## 2024-03-13 LAB — GASTROINTESTINAL PANEL BY PCR, STOOL (REPLACES STOOL CULTURE)

## 2024-03-13 LAB — CBC WITH DIFFERENTIAL/PLATELET
Abs Immature Granulocytes: 0.07 10*3/uL (ref 0.00–0.07)
Basophils Absolute: 0 10*3/uL (ref 0.0–0.1)
Basophils Relative: 0 %
Eosinophils Absolute: 0.2 10*3/uL (ref 0.0–0.5)
Eosinophils Relative: 2 %
HCT: 40.2 % (ref 36.0–46.0)
Hemoglobin: 13 g/dL (ref 12.0–15.0)
Immature Granulocytes: 1 %
Lymphocytes Relative: 10 %
Lymphs Abs: 0.8 10*3/uL (ref 0.7–4.0)
MCH: 30.4 pg (ref 26.0–34.0)
MCHC: 32.3 g/dL (ref 30.0–36.0)
MCV: 94.1 fL (ref 80.0–100.0)
Monocytes Absolute: 0.4 10*3/uL (ref 0.1–1.0)
Monocytes Relative: 5 %
Neutro Abs: 6.6 10*3/uL (ref 1.7–7.7)
Neutrophils Relative %: 82 %
Platelets: 280 10*3/uL (ref 150–400)
RBC: 4.27 MIL/uL (ref 3.87–5.11)
RDW: 14.8 % (ref 11.5–15.5)
WBC: 8.1 10*3/uL (ref 4.0–10.5)
nRBC: 0 % (ref 0.0–0.2)

## 2024-03-13 LAB — GLUCOSE, CAPILLARY
Glucose-Capillary: 212 mg/dL — ABNORMAL HIGH (ref 70–99)
Glucose-Capillary: 172 mg/dL — ABNORMAL HIGH (ref 70–99)
Glucose-Capillary: 223 mg/dL — ABNORMAL HIGH (ref 70–99)
Glucose-Capillary: 246 mg/dL — ABNORMAL HIGH (ref 70–99)
Glucose-Capillary: 266 mg/dL — ABNORMAL HIGH (ref 70–99)
Glucose-Capillary: 235 mg/dL — ABNORMAL HIGH (ref 70–99)

## 2024-03-13 LAB — MRSA NEXT GEN BY PCR, NASAL: MRSA by PCR Next Gen: DETECTED — AB

## 2024-03-13 MED ORDER — SODIUM CHLORIDE 0.9% FLUSH: 10.0000 mL | INTRAVENOUS | Status: DC | PRN

## 2024-03-13 MED ORDER — INSULIN ASPART 100 UNIT/ML IJ SOLN
0.0000 [IU] | Freq: Three times a day (TID) | INTRAMUSCULAR | Status: DC
  Administered 2024-03-13: 11 [IU] via SUBCUTANEOUS
  Administered 2024-03-14: 7 [IU] via SUBCUTANEOUS

## 2024-03-13 MED ORDER — INSULIN ASPART 100 UNIT/ML IJ SOLN
0.0000 [IU] | Freq: Every day | INTRAMUSCULAR | Status: DC
  Administered 2024-03-13: 2 [IU] via SUBCUTANEOUS

## 2024-03-13 MED ORDER — PANTOPRAZOLE SODIUM 40 MG PO TBEC
40.0000 mg | DELAYED_RELEASE_TABLET | Freq: Every day | ORAL | Status: DC
  Administered 2024-03-13 – 2024-03-14 (×2): 40 mg via ORAL
  Filled 2024-03-13 (×2): qty 1

## 2024-03-13 NOTE — Progress Notes (Signed)
 PROGRESS NOTE    [name]  FMW:[id number] DOB: 02/14/1971 DOA: 03/11/2024 PCP: Dwight Trula SQUIBB, MD  Chief Complaint  Patient presents with   Chest Pain   Shortness of Breath    Brief Narrative:   [name] is [name] 53 y.o. female with history of SVT, diabetes mellitus type 2, psoriatic arthritis presents to the ER with complaints of nausea vomiting chest pain and diarrhea.  Patient's symptoms started this morning.  Pain was in left side of the chest and subsequently started having multiple episodes of vomiting and also had watery diarrhea.  Denies any recent use of antibiotics.  Patient did have surgery for the right eye 3 weeks ago.  Patient subsequently started having abdominal discomfort.   Assessment & Plan:   Principal Problem:   SBO (small bowel obstruction) (HCC) Active Problems:   Insulin -requiring or dependent type II diabetes mellitus (HCC)   Psoriatic arthritis (HCC)   SVT (supraventricular tachycardia) (HCC)  Small Bowel Obstruction CT with multiple mildly to moderately dilated loops of jejunum containing gas and fluid, difficult to localize transition point SBO protocol  Plain films show pSBO Appreciate surgery assistance - K>4, phos>3, mg >2 - mobilize.  plan for NGT clamping today and CLD.  Nausea  Vomiting  Diarrhea Negative C diff, negative GI path panel   Chest Discomfort No current complaints - suspect related to above Troponin negative x2, echo without WMA  Sinus Tachycardia Hx SVT No comparisons that I see prior to 2019 for EKG's TSH wnl She notes hx of tachycardia  Echo with preserved EF Continue verapamil  as able   T2DM Takes u500 insulin   Currently on significantly less than reported 150-180 units breakfast, lunch, and dinner.  Attention to BG's at discharge and consider reduction in regimen if appropriate.   SSI  Psoriatic Arthritis Takes taltz     DVT prophylaxis: heparin  Code Status: full Family Communication: son at  bedside Disposition:   Status is: Inpatient Remains inpatient appropriate because: need for ongoing care   Consultants:  surgery  Procedures:  none  Antimicrobials:  Anti-infectives (From admission, onward)    None       Subjective: Feels better today  Objective: Vitals:   03/13/24 0104 03/13/24 0434 03/13/24 0810 03/13/24 1207  BP: 125/63 118/60 131/77 120/60  Pulse: 99 96 (!) 110 99  Resp: 17 18 17 17   Temp: 98.9 F (37.2 C) 99.1 F (37.3 C) 99.1 F (37.3 C) 98.8 F (37.1 C)  TempSrc: Oral Oral Oral Oral  SpO2: 97% 97% 98% 97%  Weight:      Height:        Intake/Output Summary (Last 24 hours) at 03/13/2024 1430 Last data filed at 03/13/2024 1100 Gross per 24 hour  Intake 279.59 ml  Output 50 ml  Net 229.59 ml   Filed Weights   03/11/24 1529 03/12/24 0314  Weight: 113.4 kg 125.3 kg    Examination:  General: No acute distress. Cardiovascular: tachy, regular Lungs: unlabored Abdomen: Soft, nontender, protuberant Neurological: Alert and oriented 3. Moves all extremities 4 with equal strength. Cranial nerves II through XII grossly intact. Extremities: No clubbing or cyanosis. No edema.   Data Reviewed: I have personally reviewed following labs and imaging studies  CBC: Recent Labs  Lab 03/11/24 1542 03/12/24 0539 03/13/24 0915  WBC 12.0* 12.8* 8.1  NEUTROABS 10.1*  --  6.6  HGB 14.7 14.5 13.0  HCT 45.3 43.4 40.2  MCV 92.4 91.4 94.1  PLT 335 335 280  Basic Metabolic Panel: Recent Labs  Lab 03/11/24 1542 03/12/24 0553 03/12/24 1257 03/13/24 0915  NA 136 134*  --  138  K 3.6 3.7  --  3.5  CL 100 104  --  106  CO2 24 21*  --  23  GLUCOSE 170* 173*  --  221*  BUN 9 10  --  9  CREATININE 0.54 0.59  --  0.59  CALCIUM  9.3 8.4*  --  8.0*  MG  --   --  1.5*  --   PHOS  --   --  3.3  --     GFR: Estimated Creatinine Clearance: 111.8 mL/min (by C-G formula based on SCr of 0.59 mg/dL).  Liver Function Tests: Recent Labs  Lab  03/11/24 1542 03/12/24 0553 03/13/24 0915  AST 24 22 23   ALT 22 18 22   ALKPHOS 57 58 53  BILITOT 0.6 0.8 0.9  PROT 7.9 7.3 6.6  ALBUMIN 3.5 3.1* 2.8*    CBG: Recent Labs  Lab 03/12/24 2014 03/13/24 0055 03/13/24 0439 03/13/24 0808 03/13/24 1204  GLUCAP 203* 212* 172* 223* 246*     Recent Results (from the past 240 hours)  Resp panel by RT-PCR (RSV, Flu Allycia Pitz&B, Covid) Anterior Nasal Swab     Status: None   Collection Time: 03/11/24  5:08 PM   Specimen: Anterior Nasal Swab  Result Value Ref Range Status   SARS Coronavirus 2 by RT PCR NEGATIVE NEGATIVE Final   Influenza Eniya Cannady by PCR NEGATIVE NEGATIVE Final   Influenza B by PCR NEGATIVE NEGATIVE Final    Comment: (NOTE) The Xpert Xpress SARS-CoV-2/FLU/RSV plus assay is intended as an aid in the diagnosis of influenza from Nasopharyngeal swab specimens and should not be used as Adalina Dopson sole basis for treatment. Nasal washings and aspirates are unacceptable for Xpert Xpress SARS-CoV-2/FLU/RSV testing.  Fact Sheet for Patients: BloggerCourse.com  Fact Sheet for Healthcare Providers: SeriousBroker.it  This test is not yet approved or cleared by the [place]  FDA and has been authorized for detection and/or diagnosis of SARS-CoV-2 by FDA under an Emergency Use Authorization (EUA). This EUA will remain in effect (meaning this test can be used) for the duration of the COVID-19 declaration under Section 564(b)(1) of the Act, 21 U.S.C. section 360bbb-3(b)(1), unless the authorization is terminated or revoked.     Resp Syncytial Virus by PCR NEGATIVE NEGATIVE Final    Comment: (NOTE) Fact Sheet for Patients: BloggerCourse.com  Fact Sheet for Healthcare Providers: SeriousBroker.it  This test is not yet approved or cleared by the [place]  FDA and has been authorized for detection and/or diagnosis of SARS-CoV-2 by FDA under an  Emergency Use Authorization (EUA). This EUA will remain in effect (meaning this test can be used) for the duration of the COVID-19 declaration under Section 564(b)(1) of the Act, 21 U.S.C. section 360bbb-3(b)(1), unless the authorization is terminated or revoked.  Performed at [facility], [address] [place], [place] [address]   C Difficile Quick Screen w PCR reflex     Status: None   Collection Time: 03/12/24  5:15 AM   Specimen: STOOL  Result Value Ref Range Status   C Diff antigen NEGATIVE NEGATIVE Final   C Diff toxin NEGATIVE NEGATIVE Final   C Diff interpretation No C. difficile detected.  Final    Comment: Performed at [facility], [address], [place], [place] [address]  Gastrointestinal Panel by PCR , Stool     Status: None  Collection Time: 03/12/24  5:15 AM   Specimen: STOOL  Result Value Ref Range Status   Campylobacter species NOT DETECTED NOT DETECTED Final   Plesimonas shigelloides NOT DETECTED NOT DETECTED Final   Salmonella species NOT DETECTED NOT DETECTED Final   Yersinia enterocolitica NOT DETECTED NOT DETECTED Final   Vibrio species NOT DETECTED NOT DETECTED Final   Vibrio cholerae NOT DETECTED NOT DETECTED Final   Enteroaggregative E coli (EAEC) NOT DETECTED NOT DETECTED Final   Enteropathogenic E coli (EPEC) NOT DETECTED NOT DETECTED Final   Enterotoxigenic E coli (ETEC) NOT DETECTED NOT DETECTED Final   Shiga like toxin producing E coli (STEC) NOT DETECTED NOT DETECTED Final   Shigella/Enteroinvasive E coli (EIEC) NOT DETECTED NOT DETECTED Final   Cryptosporidium NOT DETECTED NOT DETECTED Final   Cyclospora cayetanensis NOT DETECTED NOT DETECTED Final   Entamoeba histolytica NOT DETECTED NOT DETECTED Final   Giardia lamblia NOT DETECTED NOT DETECTED Final   Adenovirus F40/41 NOT DETECTED NOT DETECTED Final   Astrovirus NOT DETECTED NOT DETECTED Final   Norovirus GI/GII NOT DETECTED NOT DETECTED Final   Rotavirus Cindel Daugherty NOT DETECTED NOT  DETECTED Final   Sapovirus (I, II, IV, and V) NOT DETECTED NOT DETECTED Final    Comment: Performed at [facility], [address] [place], [place] [address]         Radiology Studies: DG Abd Portable 1V-Small Bowel Obstruction Protocol-initial, 8 hr delay Result Date: 03/13/2024 CLINICAL DATA:  8 hour follow-up small-bowel film EXAM: PORTABLE ABDOMEN - 1 VIEW COMPARISON:  03/12/2024 FINDINGS: Gastric catheter is noted within the stomach. Administered contrast lies partially within the proximal colon. Some residual small bowel dilatation is noted although overall improved when compared with the prior exam. No free air is noted. No bony abnormality is seen. IMPRESSION: Persistent but improved small bowel dilatation. Contrast is noted within the colon consistent with Rocket Gunderson partial small bowel obstruction. Electronically Signed   By: Oneil Devonshire M.D.   On: 03/13/2024 01:52   ECHOCARDIOGRAM COMPLETE Result Date: 03/12/2024    ECHOCARDIOGRAM REPORT   Patient Name:   [name] Date of Exam: 03/12/2024 Medical Rec #:  [id number]          Height:       67.0 in Accession #:    [id number]         Weight:       276.2 lb Date of Birth:  1971/03/09          BSA:          2.320 m Patient Age:    53 years           BP:           140/66 mmHg Patient Gender: F                  HR:           112 bpm. Exam Location:  Inpatient Procedure: Color Doppler, Cardiac Doppler and 2D Echo (Both Spectral and Color            Flow Doppler were utilized during procedure). Indications:    chest pain  History:        Patient has prior history of Echocardiogram examinations, most                 recent 03/24/2013.  Sonographer:    Benard Stallion Referring Phys: [id number] ARSHAD N KAKRAKANDY IMPRESSIONS  1. Left ventricular ejection fraction, by estimation,  is 65 to 70%. The left ventricle has normal function. The left ventricle has no regional wall motion abnormalities. Left ventricular diastolic parameters were normal.  2.  Right ventricular systolic function is normal. The right ventricular size is normal.  3. The mitral valve is grossly normal. No evidence of mitral valve regurgitation. No evidence of mitral stenosis.  4. The aortic valve is normal in structure. Aortic valve regurgitation is not visualized. No aortic stenosis is present.  5. The inferior vena cava is normal in size with greater than 50% respiratory variability, suggesting right atrial pressure of 3 mmHg. FINDINGS  Left Ventricle: Left ventricular ejection fraction, by estimation, is 65 to 70%. The left ventricle has normal function. The left ventricle has no regional wall motion abnormalities. The left ventricular internal cavity size was normal in size. There is  no left ventricular hypertrophy. Left ventricular diastolic function could not be evaluated due to nondiagnostic images. Left ventricular diastolic parameters were normal. Right Ventricle: The right ventricular size is normal. No increase in right ventricular wall thickness. Right ventricular systolic function is normal. Left Atrium: Left atrial size was normal in size. Right Atrium: Right atrial size was normal in size. Pericardium: There is no evidence of pericardial effusion. Mitral Valve: The mitral valve is grossly normal. No evidence of mitral valve regurgitation. No evidence of mitral valve stenosis. Tricuspid Valve: The tricuspid valve is not well visualized. Tricuspid valve regurgitation is not demonstrated. No evidence of tricuspid stenosis. Aortic Valve: The aortic valve is normal in structure. Aortic valve regurgitation is not visualized. No aortic stenosis is present. Aortic valve mean gradient measures 8.0 mmHg. Aortic valve peak gradient measures 13.7 mmHg. Aortic valve area, by VTI measures 2.33 cm. Pulmonic Valve: The pulmonic valve was not well visualized. Pulmonic valve regurgitation is not visualized. No evidence of pulmonic stenosis. Aorta: The aortic root is normal in size and  structure. Venous: The inferior vena cava is normal in size with greater than 50% respiratory variability, suggesting right atrial pressure of 3 mmHg. IAS/Shunts: No atrial level shunt detected by color flow Doppler.  LEFT VENTRICLE PLAX 2D LVIDd:         4.10 cm   Diastology LVIDs:         2.70 cm   LV e' medial:    6.84 cm/s LV PW:         1.00 cm   LV E/e' medial:  14.8 LV IVS:        1.00 cm   LV e' lateral:   9.01 cm/s LVOT diam:     2.00 cm   LV E/e' lateral: 11.2 LV SV:         66 LV SV Index:   28 LVOT Area:     3.14 cm  RIGHT VENTRICLE RV S prime:     16.20 cm/s TAPSE (M-mode): 2.9 cm LEFT ATRIUM             Index        RIGHT ATRIUM           Index LA diam:        3.60 cm 1.55 cm/m   RA Area:     14.90 cm LA Vol (A2C):   17.8 ml 7.67 ml/m   RA Volume:   35.60 ml  15.35 ml/m LA Vol (A4C):   41.5 ml 17.89 ml/m LA Biplane Vol: 27.9 ml 12.03 ml/m  AORTIC VALVE AV Area (Vmax):    2.17 cm AV Area (Vmean):  2.13 cm AV Area (VTI):     2.33 cm AV Vmax:           185.00 cm/s AV Vmean:          129.333 cm/s AV VTI:            0.281 m AV Peak Grad:      13.7 mmHg AV Mean Grad:      8.0 mmHg LVOT Vmax:         128.00 cm/s LVOT Vmean:        87.800 cm/s LVOT VTI:          0.209 m LVOT/AV VTI ratio: 0.74  AORTA Ao Root diam: 2.40 cm MITRAL VALVE MV Area (PHT): 5.02 cm     SHUNTS MV Decel Time: 151 msec     Systemic VTI:  0.21 m MV E velocity: 101.00 cm/s  Systemic Diam: 2.00 cm MV Carlosdaniel Grob velocity: 154.00 cm/s MV E/Fairy Ashlock ratio:  0.66 Aditya Sabharwal Electronically signed by Ria Commander Signature Date/Time: 03/12/2024/3:31:54 PM    Final    DG Abd Portable 1V-Small Bowel Protocol-Position Verification Result Date: 03/12/2024 CLINICAL DATA:  Nasogastric tube placement. EXAM: PORTABLE ABDOMEN - 1 VIEW COMPARISON:  03/12/2024. FINDINGS: Nasogastric tube terminates in the cardiac portion of the stomach with the side port well beyond the gastroesophageal junction. Gaseous distention of small bowel, partially imaged.  IMPRESSION: 1. Nasogastric tube terminates in the stomach. 2. Small bowel obstruction. Electronically Signed   By: Newell Eke M.D.   On: 03/12/2024 15:15   DG Abd 1 View Result Date: 03/12/2024 CLINICAL DATA:  Nausea, vomiting EXAM: ABDOMEN - 1 VIEW COMPARISON:  CT from previous day FINDINGS: Stomach is partially distended. Multiple dilated small bowel loops in the mid abdomen similar in number and degree of dilatation to prior exam. The colon is decompressed. No abnormal abdominal calcifications. Regional bones unremarkable. IMPRESSION: Persistent small bowel obstruction. Electronically Signed   By: JONETTA Faes M.D.   On: 03/12/2024 07:55   CT Angio Chest PE W and/or Wo Contrast Result Date: 03/11/2024 CLINICAL DATA:  Chest pain and shortness of breath. Epigastric abdominal pain. EXAM: CT ANGIOGRAPHY CHEST CT ABDOMEN AND PELVIS WITH CONTRAST TECHNIQUE: Multidetector CT imaging of the chest was performed using the standard protocol during bolus administration of intravenous contrast. Multiplanar CT image reconstructions and MIPs were obtained to evaluate the vascular anatomy. Multidetector CT imaging of the abdomen and pelvis was performed using the standard protocol during bolus administration of intravenous contrast. RADIATION DOSE REDUCTION: This exam was performed according to the departmental dose-optimization program which includes automated exposure control, adjustment of the mA and/or kV according to patient size and/or use of iterative reconstruction technique. CONTRAST:  75mL OMNIPAQUE  IOHEXOL  350 MG/ML SOLN COMPARISON:  Abdomen and pelvis CT dated 01/16/2020. Portable chest obtained earlier today. FINDINGS: CTA CHEST FINDINGS Cardiovascular: Satisfactory opacification of the pulmonary arteries to the segmental level. No evidence of pulmonary embolism. Normal heart size. No pericardial effusion. Mediastinum/Nodes: Moderate flattening of the trachea and mainstem bronchi. Unremarkable thyroid gland  and esophagus. No enlarged lymph nodes. Lungs/Pleura: Lungs are clear. No pleural effusion or pneumothorax. 3 mm noncalcified nodule in the left upper lobe on image number 40/4. 4 mm perifissural lymph node along the inferior surface of the minor fissure in the right middle lobe on image number 54/4, compatible with Anapaula Severt small lymph node. No other lung nodules.  No airspace consolidation or pleural fluid. Musculoskeletal: Thoracic spine degenerative changes. Review of the MIP images  confirms the above findings. CT ABDOMEN and PELVIS FINDINGS Hepatobiliary: 2 4 mm gallstones in Raesean Bartoletti contracted gallbladder with no gallbladder wall thickening or pericholecystic fluid. Unremarkable liver. Pancreas: Moderate diffuse pancreatic atrophy. Spleen: Normal in size without focal abnormality. Adrenals/Urinary Tract: Adrenal glands are unremarkable. Kidneys are normal, without renal calculi, focal lesion, or hydronephrosis. Bladder is unremarkable. Stomach/Bowel: Multiple mildly to moderately dilated loops of jejunum containing gas and fluid. No wall thickening, abnormal enhancement or pneumatosis. The stomach, duodenum and distal small bowel are normal in caliber. The transition point is difficult to localize. No obstructing mass visualized. Small, normal-appearing appendix. Unremarkable colon. Vascular/Lymphatic: No significant vascular findings are present. No enlarged abdominal or pelvic lymph nodes. Reproductive: Uterus and bilateral adnexa are unremarkable. Other: No abdominal wall hernia or abnormality. No abdominopelvic ascites. Musculoskeletal: Mild left and minimal right hip degenerative changes. Mild lumbar and moderate lower thoracic spine degenerative changes. Review of the MIP images confirms the above findings. IMPRESSION: 1. No pulmonary embolism or acute thoracic abnormality. 2. Moderate flattening of the trachea and mainstem bronchi, compatible with tracheobronchomalacia. 3. 3 mm noncalcified nodule in the left upper  lobe. No follow-up needed if patient is low-risk.This recommendation follows the consensus statement: Guidelines for Management of Incidental Pulmonary Nodules Detected on CT Images: From the Fleischner Society 2017; Radiology 2017; 284:228-243. 4. Multiple mildly to moderately dilated loops of jejunum containing gas and fluid. The transition point is difficult to localize. No obstructing mass visualized. This could represent Parley Pidcock partial small bowel obstruction or adynamic ileus. Electronically Signed   By: Elspeth Bathe M.D.   On: 03/11/2024 18:33   CT ABDOMEN PELVIS W CONTRAST Result Date: 03/11/2024 CLINICAL DATA:  Chest pain and shortness of breath. Epigastric abdominal pain. EXAM: CT ANGIOGRAPHY CHEST CT ABDOMEN AND PELVIS WITH CONTRAST TECHNIQUE: Multidetector CT imaging of the chest was performed using the standard protocol during bolus administration of intravenous contrast. Multiplanar CT image reconstructions and MIPs were obtained to evaluate the vascular anatomy. Multidetector CT imaging of the abdomen and pelvis was performed using the standard protocol during bolus administration of intravenous contrast. RADIATION DOSE REDUCTION: This exam was performed according to the departmental dose-optimization program which includes automated exposure control, adjustment of the mA and/or kV according to patient size and/or use of iterative reconstruction technique. CONTRAST:  75mL OMNIPAQUE  IOHEXOL  350 MG/ML SOLN COMPARISON:  Abdomen and pelvis CT dated 01/16/2020. Portable chest obtained earlier today. FINDINGS: CTA CHEST FINDINGS Cardiovascular: Satisfactory opacification of the pulmonary arteries to the segmental level. No evidence of pulmonary embolism. Normal heart size. No pericardial effusion. Mediastinum/Nodes: Moderate flattening of the trachea and mainstem bronchi. Unremarkable thyroid gland and esophagus. No enlarged lymph nodes. Lungs/Pleura: Lungs are clear. No pleural effusion or pneumothorax. 3 mm  noncalcified nodule in the left upper lobe on image number 40/4. 4 mm perifissural lymph node along the inferior surface of the minor fissure in the right middle lobe on image number 54/4, compatible with Borna Wessinger small lymph node. No other lung nodules.  No airspace consolidation or pleural fluid. Musculoskeletal: Thoracic spine degenerative changes. Review of the MIP images confirms the above findings. CT ABDOMEN and PELVIS FINDINGS Hepatobiliary: 2 4 mm gallstones in Azarah Dacy contracted gallbladder with no gallbladder wall thickening or pericholecystic fluid. Unremarkable liver. Pancreas: Moderate diffuse pancreatic atrophy. Spleen: Normal in size without focal abnormality. Adrenals/Urinary Tract: Adrenal glands are unremarkable. Kidneys are normal, without renal calculi, focal lesion, or hydronephrosis. Bladder is unremarkable. Stomach/Bowel: Multiple mildly to moderately dilated loops of jejunum containing  gas and fluid. No wall thickening, abnormal enhancement or pneumatosis. The stomach, duodenum and distal small bowel are normal in caliber. The transition point is difficult to localize. No obstructing mass visualized. Small, normal-appearing appendix. Unremarkable colon. Vascular/Lymphatic: No significant vascular findings are present. No enlarged abdominal or pelvic lymph nodes. Reproductive: Uterus and bilateral adnexa are unremarkable. Other: No abdominal wall hernia or abnormality. No abdominopelvic ascites. Musculoskeletal: Mild left and minimal right hip degenerative changes. Mild lumbar and moderate lower thoracic spine degenerative changes. Review of the MIP images confirms the above findings. IMPRESSION: 1. No pulmonary embolism or acute thoracic abnormality. 2. Moderate flattening of the trachea and mainstem bronchi, compatible with tracheobronchomalacia. 3. 3 mm noncalcified nodule in the left upper lobe. No follow-up needed if patient is low-risk.This recommendation follows the consensus statement: Guidelines  for Management of Incidental Pulmonary Nodules Detected on CT Images: From the Fleischner Society 2017; Radiology 2017; 284:228-243. 4. Multiple mildly to moderately dilated loops of jejunum containing gas and fluid. The transition point is difficult to localize. No obstructing mass visualized. This could represent Che Below partial small bowel obstruction or adynamic ileus. Electronically Signed   By: Elspeth Bathe M.D.   On: 03/11/2024 18:33   DG Chest Portable 1 View Result Date: 03/11/2024 CLINICAL DATA:  chest pain EXAM: PORTABLE CHEST - 1 VIEW COMPARISON:  05/17/2018 FINDINGS: Lungs are clear.  No pneumothorax. Heart size and mediastinal contours are within normal limits. No effusion. Visualized bones unremarkable. IMPRESSION: No acute cardiopulmonary disease. Electronically Signed   By: JONETTA Faes M.D.   On: 03/11/2024 17:22   VAS US  LOWER EXTREMITY VENOUS (DVT) (7a-7p) Result Date: 03/11/2024  Lower Venous DVT Study Patient Name:  [name]  Date of Exam:   03/11/2024 Medical Rec #: [id number]           Accession #:    [id number] Date of Birth: September 22, 1970           Patient Gender: F Patient Age:   30 years Exam Location[facility]  [facility] Procedure:      VAS US  LOWER EXTREMITY VENOUS (DVT) Referring Phys: AMJAD ALI --------------------------------------------------------------------------------  Indications: Swelling, and Edema.  Performing Technologist: Elmarie Lindau, RVT  Examination Guidelines: Michaelle Bottomley complete evaluation includes B-mode imaging, spectral Doppler, color Doppler, and power Doppler as needed of all accessible portions of each vessel. Bilateral testing is considered an integral part of Jaidan Prevette complete examination. Limited examinations for reoccurring indications may be performed as noted. The reflux portion of the exam is performed with the patient in reverse Trendelenburg.  +---------+---------------+---------+-----------+----------+-------------------+ RIGHT     CompressibilityPhasicitySpontaneityPropertiesThrombus Aging      +---------+---------------+---------+-----------+----------+-------------------+ CFV      Full           Yes      Yes                                      +---------+---------------+---------+-----------+----------+-------------------+ SFJ      Full                                                             +---------+---------------+---------+-----------+----------+-------------------+ FV Prox  Full                                                             +---------+---------------+---------+-----------+----------+-------------------+  FV Mid   Full                                                             +---------+---------------+---------+-----------+----------+-------------------+ FV DistalFull                                                             +---------+---------------+---------+-----------+----------+-------------------+ PFV      Full                                                             +---------+---------------+---------+-----------+----------+-------------------+ POP      Full           Yes      Yes                                      +---------+---------------+---------+-----------+----------+-------------------+ PTV      Full                                                             +---------+---------------+---------+-----------+----------+-------------------+ PERO                                                  Not well visualized +---------+---------------+---------+-----------+----------+-------------------+     Summary: RIGHT: - There is no evidence of deep vein thrombosis in the lower extremity. However, portions of this examination were limited- see technologist comments above.  - No cystic structure found in the popliteal fossa.   *See table(s) above for measurements and observations. Electronically signed by Penne Colorado MD on 03/11/2024  at 5:03:47 PM.    Final         Scheduled Meds:  heparin   5,000 Units Subcutaneous Q8H   insulin  aspart  0-15 Units Subcutaneous Q4H   prednisoLONE  acetate  1 drop Right Eye Daily   verapamil   240 mg Oral Daily   Continuous Infusions:     LOS: 1 day    Time spent: over 30 min    Meliton Monte, MD [facility]   To contact the attending provider between 7A-7P or the covering provider during after hours 7P-7A, please log into the web site [email or web address] and access using universal [place] password for that web site. If you do not have the password, please call the hospital operator.  03/13/2024, 2:30 PM

## 2024-03-13 NOTE — Plan of Care (Signed)
  Problem: Fluid Volume: Goal: Ability to maintain a balanced intake and output will improve Outcome: Progressing   Problem: Coping: Goal: Ability to adjust to condition or change in health will improve Outcome: Progressing   Problem: Nutritional: Goal: Maintenance of adequate nutrition will improve Outcome: Progressing   Problem: Activity: Goal: Risk for activity intolerance will decrease Outcome: Progressing   Problem: Nutrition: Goal: Adequate nutrition will be maintained Outcome: Progressing

## 2024-03-13 NOTE — Progress Notes (Signed)
 Subjective: CC: No current abdominal pain. Last dose of prn pain medication was 0026 this am. NGT output non-bilious. 50cc/24 hours documented. She reports no nausea. She is passing flatus and had several loose/liquid bm's with some solid material in it overnight. No bloody stools. C. Diff negative. GI panel pending.   Afebrile. Tachycardia resolved. No hypotension. Labs pending this am. 8 hour delay film with improved small bowel dilatation and contrast within the colon.   Objective: Vital signs in last 24 hours: Temp:  [98.9 F (37.2 C)-99.7 F (37.6 C)] 99.1 F (37.3 C) (06/26 0434) Pulse Rate:  [96-117] 96 (06/26 0434) Resp:  [17-19] 18 (06/26 0434) BP: (117-146)/(60-79) 118/60 (06/26 0434) SpO2:  [97 %-98 %] 97 % (06/26 0434) Last BM Date : 03/12/24  Intake/Output from previous day: 06/25 0701 - 06/26 0700 In: 39.6 [I.V.:39.6] Out: 50 [Emesis/NG output:50] Intake/Output this shift: No intake/output data recorded.  PE: Gen:  Alert, NAD, pleasant Abd: Soft, improved upper abdominal distension, mild luq ttp that is improved from yesterday and without rigidity or guarding. Otherwise NT. +BS  Lab Results:  Recent Labs    03/11/24 1542 03/12/24 0539  WBC 12.0* 12.8*  HGB 14.7 14.5  HCT 45.3 43.4  PLT 335 335   BMET Recent Labs    03/11/24 1542 03/12/24 0553  NA 136 134*  K 3.6 3.7  CL 100 104  CO2 24 21*  GLUCOSE 170* 173*  BUN 9 10  CREATININE 0.54 0.59  CALCIUM  9.3 8.4*   PT/INR No results for input(s): LABPROT, INR in the last 72 hours. CMP     Component Value Date/Time   NA 134 (L) 03/12/2024 0553   K 3.7 03/12/2024 0553   CL 104 03/12/2024 0553   CO2 21 (L) 03/12/2024 0553   GLUCOSE 173 (H) 03/12/2024 0553   BUN 10 03/12/2024 0553   CREATININE 0.59 03/12/2024 0553   CALCIUM  8.4 (L) 03/12/2024 0553   PROT 7.3 03/12/2024 0553   ALBUMIN 3.1 (L) 03/12/2024 0553   AST 22 03/12/2024 0553   ALT 18 03/12/2024 0553   ALKPHOS 58 03/12/2024  0553   BILITOT 0.8 03/12/2024 0553   GFRNONAA >60 03/12/2024 0553   GFRAA >60 10/21/2018 0349   Lipase     Component Value Date/Time   LIPASE 21 03/11/2024 1754    Studies/Results: DG Abd Portable 1V-Small Bowel Obstruction Protocol-initial, 8 hr delay Result Date: 03/13/2024 CLINICAL DATA:  8 hour follow-up small-bowel film EXAM: PORTABLE ABDOMEN - 1 VIEW COMPARISON:  03/12/2024 FINDINGS: Gastric catheter is noted within the stomach. Administered contrast lies partially within the proximal colon. Some residual small bowel dilatation is noted although overall improved when compared with the prior exam. No free air is noted. No bony abnormality is seen. IMPRESSION: Persistent but improved small bowel dilatation. Contrast is noted within the colon consistent with a partial small bowel obstruction. Electronically Signed   By: Oneil Devonshire M.D.   On: 03/13/2024 01:52   ECHOCARDIOGRAM COMPLETE Result Date: 03/12/2024    ECHOCARDIOGRAM REPORT   Patient Name:   [name] Date of Exam: 03/12/2024 Medical Rec #:  [id number]          Height:       67.0 in Accession #:    [id number]         Weight:       276.2 lb Date of Birth:  10/25/1970          BSA:  2.320 m Patient Age:    53 years           BP:           140/66 mmHg Patient Gender: F                  HR:           112 bpm. Exam Location:  Inpatient Procedure: Color Doppler, Cardiac Doppler and 2D Echo (Both Spectral and Color            Flow Doppler were utilized during procedure). Indications:    chest pain  History:        Patient has prior history of Echocardiogram examinations, most                 recent 03/24/2013.  Sonographer:    Benard Stallion Referring Phys: [id number] ARSHAD N KAKRAKANDY IMPRESSIONS  1. Left ventricular ejection fraction, by estimation, is 65 to 70%. The left ventricle has normal function. The left ventricle has no regional wall motion abnormalities. Left ventricular diastolic parameters were normal.  2. Right  ventricular systolic function is normal. The right ventricular size is normal.  3. The mitral valve is grossly normal. No evidence of mitral valve regurgitation. No evidence of mitral stenosis.  4. The aortic valve is normal in structure. Aortic valve regurgitation is not visualized. No aortic stenosis is present.  5. The inferior vena cava is normal in size with greater than 50% respiratory variability, suggesting right atrial pressure of 3 mmHg. FINDINGS  Left Ventricle: Left ventricular ejection fraction, by estimation, is 65 to 70%. The left ventricle has normal function. The left ventricle has no regional wall motion abnormalities. The left ventricular internal cavity size was normal in size. There is  no left ventricular hypertrophy. Left ventricular diastolic function could not be evaluated due to nondiagnostic images. Left ventricular diastolic parameters were normal. Right Ventricle: The right ventricular size is normal. No increase in right ventricular wall thickness. Right ventricular systolic function is normal. Left Atrium: Left atrial size was normal in size. Right Atrium: Right atrial size was normal in size. Pericardium: There is no evidence of pericardial effusion. Mitral Valve: The mitral valve is grossly normal. No evidence of mitral valve regurgitation. No evidence of mitral valve stenosis. Tricuspid Valve: The tricuspid valve is not well visualized. Tricuspid valve regurgitation is not demonstrated. No evidence of tricuspid stenosis. Aortic Valve: The aortic valve is normal in structure. Aortic valve regurgitation is not visualized. No aortic stenosis is present. Aortic valve mean gradient measures 8.0 mmHg. Aortic valve peak gradient measures 13.7 mmHg. Aortic valve area, by VTI measures 2.33 cm. Pulmonic Valve: The pulmonic valve was not well visualized. Pulmonic valve regurgitation is not visualized. No evidence of pulmonic stenosis. Aorta: The aortic root is normal in size and structure.  Venous: The inferior vena cava is normal in size with greater than 50% respiratory variability, suggesting right atrial pressure of 3 mmHg. IAS/Shunts: No atrial level shunt detected by color flow Doppler.  LEFT VENTRICLE PLAX 2D LVIDd:         4.10 cm   Diastology LVIDs:         2.70 cm   LV e' medial:    6.84 cm/s LV PW:         1.00 cm   LV E/e' medial:  14.8 LV IVS:        1.00 cm   LV e' lateral:   9.01 cm/s LVOT diam:  2.00 cm   LV E/e' lateral: 11.2 LV SV:         66 LV SV Index:   28 LVOT Area:     3.14 cm  RIGHT VENTRICLE RV S prime:     16.20 cm/s TAPSE (M-mode): 2.9 cm LEFT ATRIUM             Index        RIGHT ATRIUM           Index LA diam:        3.60 cm 1.55 cm/m   RA Area:     14.90 cm LA Vol (A2C):   17.8 ml 7.67 ml/m   RA Volume:   35.60 ml  15.35 ml/m LA Vol (A4C):   41.5 ml 17.89 ml/m LA Biplane Vol: 27.9 ml 12.03 ml/m  AORTIC VALVE AV Area (Vmax):    2.17 cm AV Area (Vmean):   2.13 cm AV Area (VTI):     2.33 cm AV Vmax:           185.00 cm/s AV Vmean:          129.333 cm/s AV VTI:            0.281 m AV Peak Grad:      13.7 mmHg AV Mean Grad:      8.0 mmHg LVOT Vmax:         128.00 cm/s LVOT Vmean:        87.800 cm/s LVOT VTI:          0.209 m LVOT/AV VTI ratio: 0.74  AORTA Ao Root diam: 2.40 cm MITRAL VALVE MV Area (PHT): 5.02 cm     SHUNTS MV Decel Time: 151 msec     Systemic VTI:  0.21 m MV E velocity: 101.00 cm/s  Systemic Diam: 2.00 cm MV A velocity: 154.00 cm/s MV E/A ratio:  0.66 Aditya Sabharwal Electronically signed by Ria Commander Signature Date/Time: 03/12/2024/3:31:54 PM    Final    DG Abd Portable 1V-Small Bowel Protocol-Position Verification Result Date: 03/12/2024 CLINICAL DATA:  Nasogastric tube placement. EXAM: PORTABLE ABDOMEN - 1 VIEW COMPARISON:  03/12/2024. FINDINGS: Nasogastric tube terminates in the cardiac portion of the stomach with the side port well beyond the gastroesophageal junction. Gaseous distention of small bowel, partially imaged. IMPRESSION:  1. Nasogastric tube terminates in the stomach. 2. Small bowel obstruction. Electronically Signed   By: Newell Eke M.D.   On: 03/12/2024 15:15   DG Abd 1 View Result Date: 03/12/2024 CLINICAL DATA:  Nausea, vomiting EXAM: ABDOMEN - 1 VIEW COMPARISON:  CT from previous day FINDINGS: Stomach is partially distended. Multiple dilated small bowel loops in the mid abdomen similar in number and degree of dilatation to prior exam. The colon is decompressed. No abnormal abdominal calcifications. Regional bones unremarkable. IMPRESSION: Persistent small bowel obstruction. Electronically Signed   By: JONETTA Faes M.D.   On: 03/12/2024 07:55   CT Angio Chest PE W and/or Wo Contrast Result Date: 03/11/2024 CLINICAL DATA:  Chest pain and shortness of breath. Epigastric abdominal pain. EXAM: CT ANGIOGRAPHY CHEST CT ABDOMEN AND PELVIS WITH CONTRAST TECHNIQUE: Multidetector CT imaging of the chest was performed using the standard protocol during bolus administration of intravenous contrast. Multiplanar CT image reconstructions and MIPs were obtained to evaluate the vascular anatomy. Multidetector CT imaging of the abdomen and pelvis was performed using the standard protocol during bolus administration of intravenous contrast. RADIATION DOSE REDUCTION: This exam was performed according to the departmental dose-optimization  program which includes automated exposure control, adjustment of the mA and/or kV according to patient size and/or use of iterative reconstruction technique. CONTRAST:  75mL OMNIPAQUE  IOHEXOL  350 MG/ML SOLN COMPARISON:  Abdomen and pelvis CT dated 01/16/2020. Portable chest obtained earlier today. FINDINGS: CTA CHEST FINDINGS Cardiovascular: Satisfactory opacification of the pulmonary arteries to the segmental level. No evidence of pulmonary embolism. Normal heart size. No pericardial effusion. Mediastinum/Nodes: Moderate flattening of the trachea and mainstem bronchi. Unremarkable thyroid gland and  esophagus. No enlarged lymph nodes. Lungs/Pleura: Lungs are clear. No pleural effusion or pneumothorax. 3 mm noncalcified nodule in the left upper lobe on image number 40/4. 4 mm perifissural lymph node along the inferior surface of the minor fissure in the right middle lobe on image number 54/4, compatible with a small lymph node. No other lung nodules.  No airspace consolidation or pleural fluid. Musculoskeletal: Thoracic spine degenerative changes. Review of the MIP images confirms the above findings. CT ABDOMEN and PELVIS FINDINGS Hepatobiliary: 2 4 mm gallstones in a contracted gallbladder with no gallbladder wall thickening or pericholecystic fluid. Unremarkable liver. Pancreas: Moderate diffuse pancreatic atrophy. Spleen: Normal in size without focal abnormality. Adrenals/Urinary Tract: Adrenal glands are unremarkable. Kidneys are normal, without renal calculi, focal lesion, or hydronephrosis. Bladder is unremarkable. Stomach/Bowel: Multiple mildly to moderately dilated loops of jejunum containing gas and fluid. No wall thickening, abnormal enhancement or pneumatosis. The stomach, duodenum and distal small bowel are normal in caliber. The transition point is difficult to localize. No obstructing mass visualized. Small, normal-appearing appendix. Unremarkable colon. Vascular/Lymphatic: No significant vascular findings are present. No enlarged abdominal or pelvic lymph nodes. Reproductive: Uterus and bilateral adnexa are unremarkable. Other: No abdominal wall hernia or abnormality. No abdominopelvic ascites. Musculoskeletal: Mild left and minimal right hip degenerative changes. Mild lumbar and moderate lower thoracic spine degenerative changes. Review of the MIP images confirms the above findings. IMPRESSION: 1. No pulmonary embolism or acute thoracic abnormality. 2. Moderate flattening of the trachea and mainstem bronchi, compatible with tracheobronchomalacia. 3. 3 mm noncalcified nodule in the left upper  lobe. No follow-up needed if patient is low-risk.This recommendation follows the consensus statement: Guidelines for Management of Incidental Pulmonary Nodules Detected on CT Images: From the Fleischner Society 2017; Radiology 2017; 284:228-243. 4. Multiple mildly to moderately dilated loops of jejunum containing gas and fluid. The transition point is difficult to localize. No obstructing mass visualized. This could represent a partial small bowel obstruction or adynamic ileus. Electronically Signed   By: Elspeth Bathe M.D.   On: 03/11/2024 18:33   CT ABDOMEN PELVIS W CONTRAST Result Date: 03/11/2024 CLINICAL DATA:  Chest pain and shortness of breath. Epigastric abdominal pain. EXAM: CT ANGIOGRAPHY CHEST CT ABDOMEN AND PELVIS WITH CONTRAST TECHNIQUE: Multidetector CT imaging of the chest was performed using the standard protocol during bolus administration of intravenous contrast. Multiplanar CT image reconstructions and MIPs were obtained to evaluate the vascular anatomy. Multidetector CT imaging of the abdomen and pelvis was performed using the standard protocol during bolus administration of intravenous contrast. RADIATION DOSE REDUCTION: This exam was performed according to the departmental dose-optimization program which includes automated exposure control, adjustment of the mA and/or kV according to patient size and/or use of iterative reconstruction technique. CONTRAST:  75mL OMNIPAQUE  IOHEXOL  350 MG/ML SOLN COMPARISON:  Abdomen and pelvis CT dated 01/16/2020. Portable chest obtained earlier today. FINDINGS: CTA CHEST FINDINGS Cardiovascular: Satisfactory opacification of the pulmonary arteries to the segmental level. No evidence of pulmonary embolism. Normal heart size. No pericardial effusion.  Mediastinum/Nodes: Moderate flattening of the trachea and mainstem bronchi. Unremarkable thyroid gland and esophagus. No enlarged lymph nodes. Lungs/Pleura: Lungs are clear. No pleural effusion or pneumothorax. 3 mm  noncalcified nodule in the left upper lobe on image number 40/4. 4 mm perifissural lymph node along the inferior surface of the minor fissure in the right middle lobe on image number 54/4, compatible with a small lymph node. No other lung nodules.  No airspace consolidation or pleural fluid. Musculoskeletal: Thoracic spine degenerative changes. Review of the MIP images confirms the above findings. CT ABDOMEN and PELVIS FINDINGS Hepatobiliary: 2 4 mm gallstones in a contracted gallbladder with no gallbladder wall thickening or pericholecystic fluid. Unremarkable liver. Pancreas: Moderate diffuse pancreatic atrophy. Spleen: Normal in size without focal abnormality. Adrenals/Urinary Tract: Adrenal glands are unremarkable. Kidneys are normal, without renal calculi, focal lesion, or hydronephrosis. Bladder is unremarkable. Stomach/Bowel: Multiple mildly to moderately dilated loops of jejunum containing gas and fluid. No wall thickening, abnormal enhancement or pneumatosis. The stomach, duodenum and distal small bowel are normal in caliber. The transition point is difficult to localize. No obstructing mass visualized. Small, normal-appearing appendix. Unremarkable colon. Vascular/Lymphatic: No significant vascular findings are present. No enlarged abdominal or pelvic lymph nodes. Reproductive: Uterus and bilateral adnexa are unremarkable. Other: No abdominal wall hernia or abnormality. No abdominopelvic ascites. Musculoskeletal: Mild left and minimal right hip degenerative changes. Mild lumbar and moderate lower thoracic spine degenerative changes. Review of the MIP images confirms the above findings. IMPRESSION: 1. No pulmonary embolism or acute thoracic abnormality. 2. Moderate flattening of the trachea and mainstem bronchi, compatible with tracheobronchomalacia. 3. 3 mm noncalcified nodule in the left upper lobe. No follow-up needed if patient is low-risk.This recommendation follows the consensus statement: Guidelines  for Management of Incidental Pulmonary Nodules Detected on CT Images: From the Fleischner Society 2017; Radiology 2017; 284:228-243. 4. Multiple mildly to moderately dilated loops of jejunum containing gas and fluid. The transition point is difficult to localize. No obstructing mass visualized. This could represent a partial small bowel obstruction or adynamic ileus. Electronically Signed   By: Elspeth Bathe M.D.   On: 03/11/2024 18:33   DG Chest Portable 1 View Result Date: 03/11/2024 CLINICAL DATA:  chest pain EXAM: PORTABLE CHEST - 1 VIEW COMPARISON:  05/17/2018 FINDINGS: Lungs are clear.  No pneumothorax. Heart size and mediastinal contours are within normal limits. No effusion. Visualized bones unremarkable. IMPRESSION: No acute cardiopulmonary disease. Electronically Signed   By: JONETTA Faes M.D.   On: 03/11/2024 17:22   VAS US  LOWER EXTREMITY VENOUS (DVT) (7a-7p) Result Date: 03/11/2024  Lower Venous DVT Study Patient Name:  [name]  Date of Exam:   03/11/2024 Medical Rec #: [id number]           Accession #:    [id number] Date of Birth: [birth year]           Patient Gender: F Patient Age:   75 years Exam Location:  [facility] Procedure:      VAS US  LOWER EXTREMITY VENOUS (DVT) Referring Phys: AMJAD ALI --------------------------------------------------------------------------------  Indications: Swelling, and Edema.  Performing Technologist: Elmarie Lindau, RVT  Examination Guidelines: A complete evaluation includes B-mode imaging, spectral Doppler, color Doppler, and power Doppler as needed of all accessible portions of each vessel. Bilateral testing is considered an integral part of a complete examination. Limited examinations for reoccurring indications may be performed as noted. The reflux portion of the exam is performed with the patient in reverse Trendelenburg.  +---------+---------------+---------+-----------+----------+-------------------+ RIGHT  CompressibilityPhasicitySpontaneityPropertiesThrombus Aging      +---------+---------------+---------+-----------+----------+-------------------+ CFV      Full           Yes      Yes                                      +---------+---------------+---------+-----------+----------+-------------------+ SFJ      Full                                                             +---------+---------------+---------+-----------+----------+-------------------+ FV Prox  Full                                                             +---------+---------------+---------+-----------+----------+-------------------+ FV Mid   Full                                                             +---------+---------------+---------+-----------+----------+-------------------+ FV DistalFull                                                             +---------+---------------+---------+-----------+----------+-------------------+ PFV      Full                                                             +---------+---------------+---------+-----------+----------+-------------------+ POP      Full           Yes      Yes                                      +---------+---------------+---------+-----------+----------+-------------------+ PTV      Full                                                             +---------+---------------+---------+-----------+----------+-------------------+ PERO                                                  Not well visualized +---------+---------------+---------+-----------+----------+-------------------+     Summary: RIGHT: - There is no evidence of deep vein thrombosis in  the lower extremity. However, portions of this examination were limited- see technologist comments above.  - No cystic structure found in the popliteal fossa.   *See table(s) above for measurements and observations. Electronically signed by Penne Colorado MD on 03/11/2024  at 5:03:47 PM.    Final     Anti-infectives: Anti-infectives (From admission, onward)    None        Assessment/Plan pSBO vs ileus  - CT w/ CT A/P with multiple mildly to moderately dilated loops of jejunum containing gas and fluid without clear transition point. No history of bowel obstructions in the past.  She has a history of prior tubal ligation and C-section.  - Patient is afebrile without tachycardia or hypotension. WBC pending. No peritonitis on exam. There was no small bowel thickening, pneumatosis, free air or free fluid on CT. She is having bowel function. No current indication for emergency surgery - Hopefully patient will improve with conservative management. If patient fails to improve with conservative management, they may require exploratory surgery during admission - Keep K >=4, Phos >= 3, Mg >= 2 and mobilize for bowel function. Okay to clamp NGT for mobilization.   - Patient with contrast in colon on delayed film, reports pain has resolved, NGT output non-bilious and she is having bowel function. Given some ongoing dilated small bowel loops on xray will clamp NGT and allow cld. If tolerates NGT clamping and CLD, okay for removal of NGT this afternoon and ADAT.   FEN - Clamp NGT, CLD, IVF per TRH VTE - SCDs, SQH ID - None   - Per [facility] -  Chest pain  Hx SVT Hx A. Fib DM PCOS Psoriatic arthritis on Taltz (ixekizumab)  Incidental findings - tracheobronchomalacia. LUL nodule.   I reviewed nursing notes, hospitalist notes, last 24 h vitals and pain scores, last 48 h intake and output, last 24 h labs and trends, and last 24 h imaging results.   LOS: 1 day    [name], [facility] 03/13/2024, 7:58 AM Please see Amion for pager number during day hours 7:00am-4:30pm

## 2024-03-13 NOTE — Progress Notes (Signed)
 Pt went to bathroom and felt her NGT may have become dislodged. The markings are at 62, the white clip was loose. I tightened it, verified placement by stethoscope with 30ml of air. Tape was still intact as well as the nose tape and clip. About 50 ml of clear liquid in canister.

## 2024-03-13 NOTE — Progress Notes (Signed)
   03/13/24 1309  TOC Brief Assessment  Insurance and Status Reviewed  Patient has primary care physician Yes  Prior level of function: independent  Prior/Current Home Services No current home services  Social Drivers of Health Review SDOH reviewed no interventions necessary  Readmission risk has been reviewed Yes  Transition of care needs no transition of care needs at this time     Transition of Care Department [facility]) has reviewed patient and no TOC needs have been identified at this time. We will continue to monitor patient advancement through interdisciplinary progression rounds. If new patient transition needs arise, please place a TOC consult.

## 2024-03-13 NOTE — Inpatient Diabetes Management (Addendum)
 Inpatient Diabetes Program Recommendations  AACE/ADA: New Consensus Statement on Inpatient Glycemic Control (2015)  Target Ranges:  Prepandial:   less than 140 mg/dL      Peak postprandial:   less than 180 mg/dL (1-2 hours)      Critically ill patients:  140 - 180 mg/dL   Lab Results  Component Value Date   GLUCAP 223 (H) 03/13/2024   HGBA1C 9.2 (H) 03/12/2024    Review of Glycemic Control  Latest Reference Range & Units 03/12/24 08:19 03/12/24 12:03 03/12/24 16:15 03/12/24 20:14 03/13/24 00:55 03/13/24 04:39 03/13/24 08:08  Glucose-Capillary 70 - 99 mg/dL 788 (H) 752 (H) 776 (H) 203 (H) 212 (H) 172 (H) 223 (H)   Diabetes history: DM 2 Outpatient Diabetes medications: Concentrated U-500 insulin  180-150-150 Current orders for Inpatient glycemic control:  Novolog  0-15 units Q4 hours  A1c 9.2% on 6/25 on u-500 outpatient  Inpatient Diabetes Program Recommendations:    -   Consider Semglee  10 units  Pt starting on full liquid diet which consists of many carbohydrates. Will need more insulin .  Spoke with pt at bedside. Pt is in very close communication with her Endocrinologist, Dr. Faythe. She would prefer for him to make any changes to her insulin  regimen. Pt reports taking her insulin  all the time and usually does not have issues at home. Pt uses a Dexcom G7 for CBG monitoring and would like a replacement sensor at time of discharge.  Thanks,  Clotilda Bull RN, MSN, BC-ADM Inpatient Diabetes Coordinator Team Pager [phone] (8a-5p)

## 2024-03-14 DIAGNOSIS — K56609 Unspecified intestinal obstruction, unspecified as to partial versus complete obstruction: Secondary | ICD-10-CM | POA: Diagnosis not present

## 2024-03-14 LAB — CBC WITH DIFFERENTIAL/PLATELET
Abs Immature Granulocytes: 0.05 10*3/uL (ref 0.00–0.07)
Basophils Absolute: 0 10*3/uL (ref 0.0–0.1)
Basophils Relative: 0 %
Eosinophils Absolute: 0.1 10*3/uL (ref 0.0–0.5)
Eosinophils Relative: 1 %
HCT: 39.2 % (ref 36.0–46.0)
Hemoglobin: 12.7 g/dL (ref 12.0–15.0)
Immature Granulocytes: 1 %
Lymphocytes Relative: 10 %
Lymphs Abs: 0.7 10*3/uL (ref 0.7–4.0)
MCH: 30.6 pg (ref 26.0–34.0)
MCHC: 32.4 g/dL (ref 30.0–36.0)
MCV: 94.5 fL (ref 80.0–100.0)
Monocytes Absolute: 0.6 10*3/uL (ref 0.1–1.0)
Monocytes Relative: 9 %
Neutro Abs: 5.5 10*3/uL (ref 1.7–7.7)
Neutrophils Relative %: 79 %
Platelets: 267 10*3/uL (ref 150–400)
RBC: 4.15 MIL/uL (ref 3.87–5.11)
RDW: 14.6 % (ref 11.5–15.5)
WBC: 6.9 10*3/uL (ref 4.0–10.5)
nRBC: 0 % (ref 0.0–0.2)

## 2024-03-14 LAB — COMPREHENSIVE METABOLIC PANEL WITH GFR
ALT: 19 U/L (ref 0–44)
AST: 22 U/L (ref 15–41)
Albumin: 2.8 g/dL — ABNORMAL LOW (ref 3.5–5.0)
Alkaline Phosphatase: 52 U/L (ref 38–126)
Anion gap: 11 (ref 5–15)
BUN: 6 mg/dL (ref 6–20)
CO2: 24 mmol/L (ref 22–32)
Calcium: 8.7 mg/dL — ABNORMAL LOW (ref 8.9–10.3)
Chloride: 101 mmol/L (ref 98–111)
Creatinine, Ser: 0.58 mg/dL (ref 0.44–1.00)
GFR, Estimated: >60 mL/min (ref >60)
Glucose, Bld: 261 mg/dL — ABNORMAL HIGH (ref 70–99)
Potassium: 3.6 mmol/L (ref 3.5–5.1)
Sodium: 136 mmol/L (ref 135–145)
Total Bilirubin: 0.7 mg/dL (ref 0.0–1.2)
Total Protein: 6.5 g/dL (ref 6.5–8.1)

## 2024-03-14 LAB — PHOSPHORUS: Phosphorus: 3.6 mg/dL (ref 2.5–4.6)

## 2024-03-14 LAB — GLUCOSE, CAPILLARY
Glucose-Capillary: 210 mg/dL — ABNORMAL HIGH (ref 70–99)
Glucose-Capillary: 228 mg/dL — ABNORMAL HIGH (ref 70–99)
Glucose-Capillary: 240 mg/dL — ABNORMAL HIGH (ref 70–99)

## 2024-03-14 LAB — MAGNESIUM: Magnesium: 1.6 mg/dL — ABNORMAL LOW (ref 1.7–2.4)

## 2024-03-14 MED ORDER — MAGNESIUM SULFATE 2 GM/50ML IV SOLN
2.0000 g | Freq: Once | INTRAVENOUS | Status: AC
  Administered 2024-03-14: 2 g via INTRAVENOUS
  Filled 2024-03-14: qty 50

## 2024-03-14 NOTE — Progress Notes (Addendum)
 Pt reported significant nausea overnight (0300)no emesis. Pt had gotten up to have BM, and sat in chair/ Called for nurse to bring antiemetic. Pt reports the PRN was effective aprox 30 mins post admin

## 2024-03-14 NOTE — Plan of Care (Signed)
   Problem: Activity: Goal: Risk for activity intolerance will decrease Outcome: Progressing   Problem: Nutrition: Goal: Adequate nutrition will be maintained Outcome: Progressing   Problem: Coping: Goal: Level of anxiety will decrease Outcome: Progressing   Problem: Elimination: Goal: Will not experience complications related to bowel motility Outcome: Progressing   Problem: Safety: Goal: Ability to remain free from injury will improve Outcome: Progressing

## 2024-03-14 NOTE — Discharge Summary (Signed)
 Physician Discharge Summary  [name] FMW:[id number] DOB: [birth year] DOA: 03/11/2024  PCP: Dwight Trula SQUIBB, MD  Admit date: 03/11/2024 Discharge date: 03/14/2024  Time spent: 40 minutes  Recommendations for Outpatient Follow-up:  Follow outpatient CBC/CMP  Attention to insulin  needs outpatient Follow pulmonary nodule outpatient    Discharge Diagnoses:  Principal Problem:   SBO (small bowel obstruction) (HCC) Active Problems:   Insulin -requiring or dependent type II diabetes mellitus (HCC)   Psoriatic arthritis (HCC)   SVT (supraventricular tachycardia) (HCC)   Discharge Condition: stable  Diet recommendation: heart healthy, diabetic  Filed Weights   03/11/24 1529 03/12/24 0314  Weight: 113.4 kg 125.3 kg    History of present illness:   [name] is [name] 53 y.o. female with history of SVT, diabetes mellitus type 2, psoriatic arthritis presents to the ER with complaints of nausea vomiting chest pain and diarrhea.  Patient's symptoms started this morning.  Pain was in left side of the chest and subsequently started having multiple episodes of vomiting and also had watery diarrhea.  Denies any recent use of antibiotics.  Patient did have surgery for the right eye 3 weeks ago.  Patient subsequently started having abdominal discomfort.   She had imaging findings concerning for [name] SBO.  Seen by surgery.  Improved after NGT, conservative care.    See below for additional details   Hospital Course:  Assessment and Plan:  Small Bowel Obstruction CT with multiple mildly to moderately dilated loops of jejunum containing gas and fluid, difficult to localize transition point SBO protocol  Plain films show pSBO KUB 6/26 with improved small bowel dilatation, contrast within colon Appreciate surgery assistance - K>4, phos>3, mg >2 - mobilize.  Tolerating soft diet today, stable for discharge per surgery.   Nausea  Vomiting  Diarrhea Negative C diff, negative GI path panel     Chest Discomfort No current complaints - suspect related to above Troponin negative x2, echo without WMA   Sinus Tachycardia Hx SVT No comparisons that I see prior to 2019 for EKG's TSH wnl She notes hx of tachycardia  Echo with preserved EF Continue verapamil    T2DM Takes u500 insulin   Currently on significantly less than reported 150-180 units breakfast, lunch, and dinner.  She's not required much in terms of SSI -> at discharge, recommend half of her normal regimen and adjust as needed.  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     Psoriatic Arthritis Takes taltz   3 mm pulmonary nodule Needs outpatient PCP follow up      Procedures:  Echo IMPRESSIONS     1. Left ventricular ejection fraction, by estimation, is 65 to 70%. The  left ventricle has normal function. The left ventricle has no regional  wall motion abnormalities. Left ventricular diastolic parameters were  normal.   2. Right ventricular systolic function is normal. The right ventricular  size is normal.   3. The mitral valve is grossly normal. No evidence of mitral valve  regurgitation. No evidence of mitral stenosis.   4. The aortic valve is normal in structure. Aortic valve regurgitation is  not visualized. No aortic stenosis is present.   5. The inferior vena cava is normal in size with greater than 50%  respiratory variability, suggesting right atrial pressure of 3 mmHg.   LE US  Summary:  RIGHT:      - There is no evidence of deep vein thrombosis in the lower extremity.  However,  portions of this examination were limited- see technologist  comments above.    - No cystic structure found in the popliteal fossa.      Consultations: surgery  Discharge Exam: Vitals:   03/14/24 0624 03/14/24 0850  BP: (!) 145/83 116/71  Pulse: 98 94  Resp:  17  Temp: 98.5 F (36.9 C) 98.7 F (37.1 C)  SpO2: 98% 98%   No complaints Tolerated soft diet for  breakfast Discussed d/c plan   General: No acute distress. Cardiovascular: RRR Lungs: unlabored Abdomen: Soft, nontender, nondistended Neurological: Alert and oriented 3. Moves all extremities 4 with equal strength. Cranial nerves II through XII grossly intact. Skin: Warm and dry. No rashes or lesions. Extremities: No clubbing or cyanosis. No edema.   Discharge Instructions   Discharge Instructions     Diet - low sodium heart healthy   Complete by: As directed    Discharge instructions   Complete by: As directed    You were seen for [name] small bowel obstruction.    You've improved with conservative care after the placement of [name] nasogastric tube.  You're currently having bowel movements and tolerating Errik Mitchelle soft diet and stable for discharge.  We've used much less than your typical insulin  requirement (this maybe for many reasons: less oral intake in the setting of your bowel obstruction, different diet in the hospital, etc.  Though the decreased amount of insulin  you've received is significant).  As you go home, use half of your recommended home insulin  regimen.  Touch base with your endocrinologist about this plan and adjust as needed (return to your original dose gradually as indicated).    Your magnesium was mildly low.  Follow this with your outpatient doctor.   Return for new, recurrent, or worsening symptoms.  Please ask your PCP to request records from this hospitalization so they know what was done and what the next steps will be.   Increase activity slowly   Complete by: As directed       Allergies as of 03/14/2024       Reactions   Cephalosporins Hives   Apremilast Diarrhea   Ozempic (0.25 Or 0.5 Mg-dose) [semaglutide(0.25 Or 0.5mg -dos)] Diarrhea, Nausea And Vomiting   Also, pancreatitis   Pneumococcal Vac Polyvalent    Other Reaction(s): swelling, itching, and redness at site of intradermal Pneumovax injection   Synjardy [empagliflozin-metformin Hcl] Other (See  Comments)   Yeast Infection   Trulicity [dulaglutide] Diarrhea, Nausea And Vomiting   Ciprofloxacin  Rash   Keflex  [cephalexin ] Rash   Latex Rash   Exam gloves when worn and when touched by care givers        Medication List     STOP taking these medications    ibuprofen  200 MG tablet Commonly known as: ADVIL        TAKE these medications    acetaminophen  500 MG tablet Commonly known as: TYLENOL  Take 1,000 mg by mouth every 6 (six) hours as needed for mild pain or headache.   albuterol  108 (90 Base) MCG/ACT inhaler Commonly known as: VENTOLIN  HFA Inhale 2 puffs into the lungs every 6 (six) hours as needed for wheezing or shortness of breath.   escitalopram  20 MG tablet Commonly known as: LEXAPRO  Take 20 mg by mouth daily.   estradiol 0.1 MG/GM vaginal cream Commonly known as: ESTRACE Place 1 Applicatorful vaginally once Alquan Morrish week. As needed   fluconazole 150 MG tablet Commonly known as: DIFLUCAN Take 150 mg by mouth as needed (bacterial infection).  HumuLIN  R U-500 KwikPen 500 UNIT/ML KwikPen Generic drug: insulin  regular human CONCENTRATED Inject 150-180 Units into the skin in the morning, at noon, and at bedtime. Inject 180 units in morning, 150 units at lunch and dinner   meloxicam  15 MG tablet Commonly known as: MOBIC  Take 15 mg by mouth daily.   norethindrone 5 MG tablet Commonly known as: AYGESTIN Take 5 mg by mouth daily.   nystatin-triamcinolone cream Commonly known as: MYCOLOG II Apply 1 application topically daily as needed (rash/irritation).   omeprazole 20 MG capsule Commonly known as: PRILOSEC Take 20 mg by mouth every morning.   prednisoLONE  acetate 1 % ophthalmic suspension Commonly known as: PRED FORTE  Apply INSTILL ONE DROP INTO THE RIGHT EYE ONCE Bowen Kia DAY What changed: Another medication with the same name was removed. Continue taking this medication, and follow the directions you see here.   rosuvastatin  5 MG tablet Commonly known as:  CRESTOR  Take 5 mg by mouth daily.   Taltz 80 MG/ML pen Generic drug: ixekizumab Inject 80 mg into the skin every 28 (twenty-eight) days.   verapamil  240 MG CR tablet Commonly known as: CALAN -SR TAKE 1 TABLET BY MOUTH AT BEDTIME What changed: when to take this   Vitamin D3 50 MCG (2000 UT) Tabs Take 2,000 Units by mouth daily.       Allergies  Allergen Reactions   Cephalosporins Hives   Apremilast Diarrhea   Ozempic (0.25 Or 0.5 Mg-Dose) [Semaglutide(0.25 Or 0.5mg -Dos)] Diarrhea and Nausea And Vomiting    Also, pancreatitis   Pneumococcal Vac Polyvalent     Other Reaction(s): swelling, itching, and redness at site of intradermal Pneumovax injection   Synjardy [Empagliflozin-Metformin Hcl] Other (See Comments)    Yeast Infection   Trulicity [Dulaglutide] Diarrhea and Nausea And Vomiting   Ciprofloxacin  Rash   Keflex  [Cephalexin ] Rash   Latex Rash    Exam gloves when worn and when touched by care givers      The results of significant diagnostics from this hospitalization (including imaging, microbiology, ancillary and laboratory) are listed below for reference.    Significant Diagnostic Studies: DG Abd Portable 1V-Small Bowel Obstruction Protocol-initial, 8 hr delay Result Date: 03/13/2024 CLINICAL DATA:  8 hour follow-up small-bowel film EXAM: PORTABLE ABDOMEN - 1 VIEW COMPARISON:  03/12/2024 FINDINGS: Gastric catheter is noted within the stomach. Administered contrast lies partially within the proximal colon. Some residual small bowel dilatation is noted although overall improved when compared with the prior exam. No free air is noted. No bony abnormality is seen. IMPRESSION: Persistent but improved small bowel dilatation. Contrast is noted within the colon consistent with Schylar Wuebker partial small bowel obstruction. Electronically Signed   By: Oneil Devonshire M.D.   On: 03/13/2024 01:52   ECHOCARDIOGRAM COMPLETE Result Date: 03/12/2024    ECHOCARDIOGRAM REPORT   Patient Name:   GABRYELLE WHITMOYER Date of Exam: 03/12/2024 Medical Rec #:  [id number]          Height:       67.0 in Accession #:    [id number]         Weight:       276.2 lb Date of Birth:  Jan 25, 1971          BSA:          2.320 m Patient Age:    53 years           BP:           140/66 mmHg Patient Gender: F  HR:           112 bpm. Exam Location:  Inpatient Procedure: Color Doppler, Cardiac Doppler and 2D Echo (Both Spectral and Color            Flow Doppler were utilized during procedure). Indications:    chest pain  History:        Patient has prior history of Echocardiogram examinations, most                 recent 03/24/2013.  Sonographer:    Benard Stallion Referring Phys: [id number] ARSHAD N KAKRAKANDY IMPRESSIONS  1. Left ventricular ejection fraction, by estimation, is 65 to 70%. The left ventricle has normal function. The left ventricle has no regional wall motion abnormalities. Left ventricular diastolic parameters were normal.  2. Right ventricular systolic function is normal. The right ventricular size is normal.  3. The mitral valve is grossly normal. No evidence of mitral valve regurgitation. No evidence of mitral stenosis.  4. The aortic valve is normal in structure. Aortic valve regurgitation is not visualized. No aortic stenosis is present.  5. The inferior vena cava is normal in size with greater than 50% respiratory variability, suggesting right atrial pressure of 3 mmHg. FINDINGS  Left Ventricle: Left ventricular ejection fraction, by estimation, is 65 to 70%. The left ventricle has normal function. The left ventricle has no regional wall motion abnormalities. The left ventricular internal cavity size was normal in size. There is  no left ventricular hypertrophy. Left ventricular diastolic function could not be evaluated due to nondiagnostic images. Left ventricular diastolic parameters were normal. Right Ventricle: The right ventricular size is normal. No increase in right ventricular wall thickness. Right  ventricular systolic function is normal. Left Atrium: Left atrial size was normal in size. Right Atrium: Right atrial size was normal in size. Pericardium: There is no evidence of pericardial effusion. Mitral Valve: The mitral valve is grossly normal. No evidence of mitral valve regurgitation. No evidence of mitral valve stenosis. Tricuspid Valve: The tricuspid valve is not well visualized. Tricuspid valve regurgitation is not demonstrated. No evidence of tricuspid stenosis. Aortic Valve: The aortic valve is normal in structure. Aortic valve regurgitation is not visualized. No aortic stenosis is present. Aortic valve mean gradient measures 8.0 mmHg. Aortic valve peak gradient measures 13.7 mmHg. Aortic valve area, by VTI measures 2.33 cm. Pulmonic Valve: The pulmonic valve was not well visualized. Pulmonic valve regurgitation is not visualized. No evidence of pulmonic stenosis. Aorta: The aortic root is normal in size and structure. Venous: The inferior vena cava is normal in size with greater than 50% respiratory variability, suggesting right atrial pressure of 3 mmHg. IAS/Shunts: No atrial level shunt detected by color flow Doppler.  LEFT VENTRICLE PLAX 2D LVIDd:         4.10 cm   Diastology LVIDs:         2.70 cm   LV e' medial:    6.84 cm/s LV PW:         1.00 cm   LV E/e' medial:  14.8 LV IVS:        1.00 cm   LV e' lateral:   9.01 cm/s LVOT diam:     2.00 cm   LV E/e' lateral: 11.2 LV SV:         66 LV SV Index:   28 LVOT Area:     3.14 cm  RIGHT VENTRICLE RV S prime:     16.20 cm/s TAPSE (M-mode): 2.9 cm  LEFT ATRIUM             Index        RIGHT ATRIUM           Index LA diam:        3.60 cm 1.55 cm/m   RA Area:     14.90 cm LA Vol (A2C):   17.8 ml 7.67 ml/m   RA Volume:   35.60 ml  15.35 ml/m LA Vol (A4C):   41.5 ml 17.89 ml/m LA Biplane Vol: 27.9 ml 12.03 ml/m  AORTIC VALVE AV Area (Vmax):    2.17 cm AV Area (Vmean):   2.13 cm AV Area (VTI):     2.33 cm AV Vmax:           185.00 cm/s AV Vmean:           129.333 cm/s AV VTI:            0.281 m AV Peak Grad:      13.7 mmHg AV Mean Grad:      8.0 mmHg LVOT Vmax:         128.00 cm/s LVOT Vmean:        87.800 cm/s LVOT VTI:          0.209 m LVOT/AV VTI ratio: 0.74  AORTA Ao Root diam: 2.40 cm MITRAL VALVE MV Area (PHT): 5.02 cm     SHUNTS MV Decel Time: 151 msec     Systemic VTI:  0.21 m MV E velocity: 101.00 cm/s  Systemic Diam: 2.00 cm MV Jazzelle Zhang velocity: 154.00 cm/s MV E/Roan Miklos ratio:  0.66 Aditya Sabharwal Electronically signed by Ria Commander Signature Date/Time: 03/12/2024/3:31:54 PM    Final    DG Abd Portable 1V-Small Bowel Protocol-Position Verification Result Date: 03/12/2024 CLINICAL DATA:  Nasogastric tube placement. EXAM: PORTABLE ABDOMEN - 1 VIEW COMPARISON:  03/12/2024. FINDINGS: Nasogastric tube terminates in the cardiac portion of the stomach with the side port well beyond the gastroesophageal junction. Gaseous distention of small bowel, partially imaged. IMPRESSION: 1. Nasogastric tube terminates in the stomach. 2. Small bowel obstruction. Electronically Signed   By: Newell Eke M.D.   On: 03/12/2024 15:15   DG Abd 1 View Result Date: 03/12/2024 CLINICAL DATA:  Nausea, vomiting EXAM: ABDOMEN - 1 VIEW COMPARISON:  CT from previous day FINDINGS: Stomach is partially distended. Multiple dilated small bowel loops in the mid abdomen similar in number and degree of dilatation to prior exam. The colon is decompressed. No abnormal abdominal calcifications. Regional bones unremarkable. IMPRESSION: Persistent small bowel obstruction. Electronically Signed   By: JONETTA Faes M.D.   On: 03/12/2024 07:55   CT Angio Chest PE W and/or Wo Contrast Result Date: 03/11/2024 CLINICAL DATA:  Chest pain and shortness of breath. Epigastric abdominal pain. EXAM: CT ANGIOGRAPHY CHEST CT ABDOMEN AND PELVIS WITH CONTRAST TECHNIQUE: Multidetector CT imaging of the chest was performed using the standard protocol during bolus administration of intravenous contrast.  Multiplanar CT image reconstructions and MIPs were obtained to evaluate the vascular anatomy. Multidetector CT imaging of the abdomen and pelvis was performed using the standard protocol during bolus administration of intravenous contrast. RADIATION DOSE REDUCTION: This exam was performed according to the departmental dose-optimization program which includes automated exposure control, adjustment of the mA and/or kV according to patient size and/or use of iterative reconstruction technique. CONTRAST:  75mL OMNIPAQUE  IOHEXOL  350 MG/ML SOLN COMPARISON:  Abdomen and pelvis CT dated 01/16/2020. Portable chest obtained earlier today. FINDINGS: CTA CHEST FINDINGS Cardiovascular:  Satisfactory opacification of the pulmonary arteries to the segmental level. No evidence of pulmonary embolism. Normal heart size. No pericardial effusion. Mediastinum/Nodes: Moderate flattening of the trachea and mainstem bronchi. Unremarkable thyroid gland and esophagus. No enlarged lymph nodes. Lungs/Pleura: Lungs are clear. No pleural effusion or pneumothorax. 3 mm noncalcified nodule in the left upper lobe on image number 40/4. 4 mm perifissural lymph node along the inferior surface of the minor fissure in the right middle lobe on image number 54/4, compatible with Thierry Dobosz small lymph node. No other lung nodules.  No airspace consolidation or pleural fluid. Musculoskeletal: Thoracic spine degenerative changes. Review of the MIP images confirms the above findings. CT ABDOMEN and PELVIS FINDINGS Hepatobiliary: 2 4 mm gallstones in Celia Friedland contracted gallbladder with no gallbladder wall thickening or pericholecystic fluid. Unremarkable liver. Pancreas: Moderate diffuse pancreatic atrophy. Spleen: Normal in size without focal abnormality. Adrenals/Urinary Tract: Adrenal glands are unremarkable. Kidneys are normal, without renal calculi, focal lesion, or hydronephrosis. Bladder is unremarkable. Stomach/Bowel: Multiple mildly to moderately dilated loops of  jejunum containing gas and fluid. No wall thickening, abnormal enhancement or pneumatosis. The stomach, duodenum and distal small bowel are normal in caliber. The transition point is difficult to localize. No obstructing mass visualized. Small, normal-appearing appendix. Unremarkable colon. Vascular/Lymphatic: No significant vascular findings are present. No enlarged abdominal or pelvic lymph nodes. Reproductive: Uterus and bilateral adnexa are unremarkable. Other: No abdominal wall hernia or abnormality. No abdominopelvic ascites. Musculoskeletal: Mild left and minimal right hip degenerative changes. Mild lumbar and moderate lower thoracic spine degenerative changes. Review of the MIP images confirms the above findings. IMPRESSION: 1. No pulmonary embolism or acute thoracic abnormality. 2. Moderate flattening of the trachea and mainstem bronchi, compatible with tracheobronchomalacia. 3. 3 mm noncalcified nodule in the left upper lobe. No follow-up needed if patient is low-risk.This recommendation follows the consensus statement: Guidelines for Management of Incidental Pulmonary Nodules Detected on CT Images: From the Fleischner Society 2017; Radiology 2017; 284:228-243. 4. Multiple mildly to moderately dilated loops of jejunum containing gas and fluid. The transition point is difficult to localize. No obstructing mass visualized. This could represent Melessa Cowell partial small bowel obstruction or adynamic ileus. Electronically Signed   By: Elspeth Bathe M.D.   On: 03/11/2024 18:33   CT ABDOMEN PELVIS W CONTRAST Result Date: 03/11/2024 CLINICAL DATA:  Chest pain and shortness of breath. Epigastric abdominal pain. EXAM: CT ANGIOGRAPHY CHEST CT ABDOMEN AND PELVIS WITH CONTRAST TECHNIQUE: Multidetector CT imaging of the chest was performed using the standard protocol during bolus administration of intravenous contrast. Multiplanar CT image reconstructions and MIPs were obtained to evaluate the vascular anatomy. Multidetector  CT imaging of the abdomen and pelvis was performed using the standard protocol during bolus administration of intravenous contrast. RADIATION DOSE REDUCTION: This exam was performed according to the departmental dose-optimization program which includes automated exposure control, adjustment of the mA and/or kV according to patient size and/or use of iterative reconstruction technique. CONTRAST:  75mL OMNIPAQUE  IOHEXOL  350 MG/ML SOLN COMPARISON:  Abdomen and pelvis CT dated 01/16/2020. Portable chest obtained earlier today. FINDINGS: CTA CHEST FINDINGS Cardiovascular: Satisfactory opacification of the pulmonary arteries to the segmental level. No evidence of pulmonary embolism. Normal heart size. No pericardial effusion. Mediastinum/Nodes: Moderate flattening of the trachea and mainstem bronchi. Unremarkable thyroid gland and esophagus. No enlarged lymph nodes. Lungs/Pleura: Lungs are clear. No pleural effusion or pneumothorax. 3 mm noncalcified nodule in the left upper lobe on image number 40/4. 4 mm perifissural lymph node along the inferior  surface of the minor fissure in the right middle lobe on image number 54/4, compatible with Aysiah Jurado small lymph node. No other lung nodules.  No airspace consolidation or pleural fluid. Musculoskeletal: Thoracic spine degenerative changes. Review of the MIP images confirms the above findings. CT ABDOMEN and PELVIS FINDINGS Hepatobiliary: 2 4 mm gallstones in Chioke Noxon contracted gallbladder with no gallbladder wall thickening or pericholecystic fluid. Unremarkable liver. Pancreas: Moderate diffuse pancreatic atrophy. Spleen: Normal in size without focal abnormality. Adrenals/Urinary Tract: Adrenal glands are unremarkable. Kidneys are normal, without renal calculi, focal lesion, or hydronephrosis. Bladder is unremarkable. Stomach/Bowel: Multiple mildly to moderately dilated loops of jejunum containing gas and fluid. No wall thickening, abnormal enhancement or pneumatosis. The stomach,  duodenum and distal small bowel are normal in caliber. The transition point is difficult to localize. No obstructing mass visualized. Small, normal-appearing appendix. Unremarkable colon. Vascular/Lymphatic: No significant vascular findings are present. No enlarged abdominal or pelvic lymph nodes. Reproductive: Uterus and bilateral adnexa are unremarkable. Other: No abdominal wall hernia or abnormality. No abdominopelvic ascites. Musculoskeletal: Mild left and minimal right hip degenerative changes. Mild lumbar and moderate lower thoracic spine degenerative changes. Review of the MIP images confirms the above findings. IMPRESSION: 1. No pulmonary embolism or acute thoracic abnormality. 2. Moderate flattening of the trachea and mainstem bronchi, compatible with tracheobronchomalacia. 3. 3 mm noncalcified nodule in the left upper lobe. No follow-up needed if patient is low-risk.This recommendation follows the consensus statement: Guidelines for Management of Incidental Pulmonary Nodules Detected on CT Images: From the Fleischner Society 2017; Radiology 2017; 284:228-243. 4. Multiple mildly to moderately dilated loops of jejunum containing gas and fluid. The transition point is difficult to localize. No obstructing mass visualized. This could represent Toinette Lackie partial small bowel obstruction or adynamic ileus. Electronically Signed   By: Elspeth Bathe M.D.   On: 03/11/2024 18:33   DG Chest Portable 1 View Result Date: 03/11/2024 CLINICAL DATA:  chest pain EXAM: PORTABLE CHEST - 1 VIEW COMPARISON:  05/17/2018 FINDINGS: Lungs are clear.  No pneumothorax. Heart size and mediastinal contours are within normal limits. No effusion. Visualized bones unremarkable. IMPRESSION: No acute cardiopulmonary disease. Electronically Signed   By: JONETTA Faes M.D.   On: 03/11/2024 17:22   VAS US  LOWER EXTREMITY VENOUS (DVT) (7a-7p) Result Date: 03/11/2024  Lower Venous DVT Study Patient Name:  PRISCELLA DONNA  Date of Exam:   03/11/2024  Medical Rec #: [id number]           Accession #:    [id number] Date of Birth: 05/24/1971           Patient Gender: F Patient Age:   2 years Exam Location:  [facility] Procedure:      VAS US  LOWER EXTREMITY VENOUS (DVT) Referring Phys: AMJAD ALI --------------------------------------------------------------------------------  Indications: Swelling, and Edema.  Performing Technologist: Elmarie Lindau, RVT  Examination Guidelines: Keshayla Schrum complete evaluation includes B-mode imaging, spectral Doppler, color Doppler, and power Doppler as needed of all accessible portions of each vessel. Bilateral testing is considered an integral part of Loys Shugars complete examination. Limited examinations for reoccurring indications may be performed as noted. The reflux portion of the exam is performed with the patient in reverse Trendelenburg.  +---------+---------------+---------+-----------+----------+-------------------+ RIGHT    CompressibilityPhasicitySpontaneityPropertiesThrombus Aging      +---------+---------------+---------+-----------+----------+-------------------+ CFV      Full           Yes      Yes                                      +---------+---------------+---------+-----------+----------+-------------------+  SFJ      Full                                                             +---------+---------------+---------+-----------+----------+-------------------+ FV Prox  Full                                                             +---------+---------------+---------+-----------+----------+-------------------+ FV Mid   Full                                                             +---------+---------------+---------+-----------+----------+-------------------+ FV DistalFull                                                             +---------+---------------+---------+-----------+----------+-------------------+ PFV      Full                                                              +---------+---------------+---------+-----------+----------+-------------------+ POP      Full           Yes      Yes                                      +---------+---------------+---------+-----------+----------+-------------------+ PTV      Full                                                             +---------+---------------+---------+-----------+----------+-------------------+ PERO                                                  Not well visualized +---------+---------------+---------+-----------+----------+-------------------+     Summary: RIGHT: - There is no evidence of deep vein thrombosis in the lower extremity. However, portions of this examination were limited- see technologist comments above.  - No cystic structure found in the popliteal fossa.   *See table(s) above for measurements and observations. Electronically signed by Penne Colorado MD on 03/11/2024 at 5:03:47 PM.    Final     Microbiology: Recent Results (from the past 240 hours)  Resp panel by RT-PCR (RSV, Flu Brysin Towery&B, Covid)  Anterior Nasal Swab     Status: None   Collection Time: 03/11/24  5:08 PM   Specimen: Anterior Nasal Swab  Result Value Ref Range Status   SARS Coronavirus 2 by RT PCR NEGATIVE NEGATIVE Final   Influenza Kashana Breach by PCR NEGATIVE NEGATIVE Final   Influenza B by PCR NEGATIVE NEGATIVE Final    Comment: (NOTE) The Xpert Xpress SARS-CoV-2/FLU/RSV plus assay is intended as an aid in the diagnosis of influenza from Nasopharyngeal swab specimens and should not be used as Balian Schaller sole basis for treatment. Nasal washings and aspirates are unacceptable for Xpert Xpress SARS-CoV-2/FLU/RSV testing.  Fact Sheet for Patients: BloggerCourse.com  Fact Sheet for Healthcare Providers: SeriousBroker.it  This test is not yet approved or cleared by the [place]  FDA and has been authorized for detection and/or diagnosis of SARS-CoV-2 by FDA  under an Emergency Use Authorization (EUA). This EUA will remain in effect (meaning this test can be used) for the duration of the COVID-19 declaration under Section 564(b)(1) of the Act, 21 U.S.C. section 360bbb-3(b)(1), unless the authorization is terminated or revoked.     Resp Syncytial Virus by PCR NEGATIVE NEGATIVE Final    Comment: (NOTE) Fact Sheet for Patients: BloggerCourse.com  Fact Sheet for Healthcare Providers: SeriousBroker.it  This test is not yet approved or cleared by the [place]  FDA and has been authorized for detection and/or diagnosis of SARS-CoV-2 by FDA under an Emergency Use Authorization (EUA). This EUA will remain in effect (meaning this test can be used) for the duration of the COVID-19 declaration under Section 564(b)(1) of the Act, 21 U.S.C. section 360bbb-3(b)(1), unless the authorization is terminated or revoked.  Performed at [facility], [address] [place], [place] [address]   C Difficile Quick Screen w PCR reflex     Status: None   Collection Time: 03/12/24  5:15 AM   Specimen: STOOL  Result Value Ref Range Status   C Diff antigen NEGATIVE NEGATIVE Final   C Diff toxin NEGATIVE NEGATIVE Final   C Diff interpretation No C. difficile detected.  Final    Comment: Performed at [facility], [address] [place], [place] [address]  Gastrointestinal Panel by PCR , Stool     Status: None   Collection Time: 03/12/24  5:15 AM   Specimen: STOOL  Result Value Ref Range Status   Campylobacter species NOT DETECTED NOT DETECTED Final   Plesimonas shigelloides NOT DETECTED NOT DETECTED Final   Salmonella species NOT DETECTED NOT DETECTED Final   Yersinia enterocolitica NOT DETECTED NOT DETECTED Final   Vibrio species NOT DETECTED NOT DETECTED Final   Vibrio cholerae NOT DETECTED NOT DETECTED Final   Enteroaggregative E coli (EAEC) NOT DETECTED NOT DETECTED Final    Enteropathogenic E coli (EPEC) NOT DETECTED NOT DETECTED Final   Enterotoxigenic E coli (ETEC) NOT DETECTED NOT DETECTED Final   Shiga like toxin producing E coli (STEC) NOT DETECTED NOT DETECTED Final   Shigella/Enteroinvasive E coli (EIEC) NOT DETECTED NOT DETECTED Final   Cryptosporidium NOT DETECTED NOT DETECTED Final   Cyclospora cayetanensis NOT DETECTED NOT DETECTED Final   Entamoeba histolytica NOT DETECTED NOT DETECTED Final   Giardia lamblia NOT DETECTED NOT DETECTED Final   Adenovirus F40/41 NOT DETECTED NOT DETECTED Final   Astrovirus NOT DETECTED NOT DETECTED Final   Norovirus GI/GII NOT DETECTED NOT DETECTED Final   Rotavirus Keliyah Spillman NOT DETECTED NOT DETECTED Final   Sapovirus (I, II, IV, and V) NOT DETECTED NOT  DETECTED Final    Comment: Performed at [facility], [address] [place], [place] [address]  MRSA Next Gen by PCR, Nasal     Status: Abnormal   Collection Time: 03/13/24 10:27 AM   Specimen: Nasal Mucosa; Nasal Swab  Result Value Ref Range Status   MRSA by PCR Next Gen DETECTED (Fransisca Shawn) NOT DETECTED Final    Comment: RESULT CALLED TO, READ BACK BY AND VERIFIED WITH: RN EMERSON CALDERWOOD [phone] @ 59 FH        The GeneXpert MRSA Assay (FDA approved for NASAL specimens only), is one component of Seanpatrick Maisano comprehensive MRSA colonization surveillance program. It is not intended to diagnose MRSA infection nor to guide or monitor treatment for MRSA infections. Performed at [facility], [address], [place], [place] [address]      Labs: Basic Metabolic Panel: Recent Labs  Lab 03/11/24 1542 03/12/24 0553 03/12/24 1257 03/13/24 0915 03/14/24 0400  NA 136 134*  --  138 136  K 3.6 3.7  --  3.5 3.6  CL 100 104  --  106 101  CO2 24 21*  --  23 24  GLUCOSE 170* 173*  --  221* 261*  BUN 9 10  --  9 6  CREATININE 0.54 0.59  --  0.59 0.58  CALCIUM  9.3 8.4*  --  8.0* 8.7*  MG  --   --  1.5*  --  1.6*  PHOS  --   --  3.3  --  3.6   Liver Function  Tests: Recent Labs  Lab 03/11/24 1542 03/12/24 0553 03/13/24 0915 03/14/24 0400  AST 24 22 23 22   ALT 22 18 22 19   ALKPHOS 57 58 53 52  BILITOT 0.6 0.8 0.9 0.7  PROT 7.9 7.3 6.6 6.5  ALBUMIN 3.5 3.1* 2.8* 2.8*   Recent Labs  Lab 03/11/24 1754  LIPASE 21   No results for input(s): AMMONIA in the last 168 hours. CBC: Recent Labs  Lab 03/11/24 1542 03/12/24 0539 03/13/24 0915 03/14/24 0400  WBC 12.0* 12.8* 8.1 6.9  NEUTROABS 10.1*  --  6.6 5.5  HGB 14.7 14.5 13.0 12.7  HCT 45.3 43.4 40.2 39.2  MCV 92.4 91.4 94.1 94.5  PLT 335 335 280 267   Cardiac Enzymes: No results for input(s): CKTOTAL, CKMB, CKMBINDEX, TROPONINI in the last 168 hours. BNP: BNP (last 3 results) Recent Labs    03/11/24 1542  BNP 39.4    ProBNP (last 3 results) No results for input(s): PROBNP in the last 8760 hours.  CBG: Recent Labs  Lab 03/13/24 1204 03/13/24 1537 03/13/24 2141 03/14/24 0045 03/14/24 0852  GLUCAP 246* 266* 235* 210* 240*       Signed:  Meliton Monte MD.  [facility] 03/14/2024, 11:38 AM

## 2024-03-14 NOTE — Plan of Care (Signed)
 Problem: Education: Goal: Ability to describe self-care measures that may prevent or decrease complications (Diabetes Survival Skills Education) will improve 03/14/2024 1218 by Robynn Avelina LABOR, RN Outcome: Adequate for Discharge 03/14/2024 1218 by Robynn Avelina LABOR, RN Outcome: Adequate for Discharge Goal: Individualized Educational Video(s) 03/14/2024 1218 by Robynn Avelina LABOR, RN Outcome: Adequate for Discharge 03/14/2024 1218 by Robynn Avelina LABOR, RN Outcome: Adequate for Discharge   Problem: Coping: Goal: Ability to adjust to condition or change in health will improve 03/14/2024 1218 by Robynn Avelina LABOR, RN Outcome: Adequate for Discharge 03/14/2024 1218 by Robynn Avelina LABOR, RN Outcome: Adequate for Discharge   Problem: Fluid Volume: Goal: Ability to maintain a balanced intake and output will improve 03/14/2024 1218 by Robynn Avelina LABOR, RN Outcome: Adequate for Discharge 03/14/2024 1218 by Robynn Avelina LABOR, RN Outcome: Adequate for Discharge   Problem: Health Behavior/Discharge Planning: Goal: Ability to identify and utilize available resources and services will improve 03/14/2024 1218 by Robynn Avelina LABOR, RN Outcome: Adequate for Discharge 03/14/2024 1218 by Robynn Avelina LABOR, RN Outcome: Adequate for Discharge Goal: Ability to manage health-related needs will improve 03/14/2024 1218 by Robynn Avelina LABOR, RN Outcome: Adequate for Discharge 03/14/2024 1218 by Robynn Avelina LABOR, RN Outcome: Adequate for Discharge   Problem: Metabolic: Goal: Ability to maintain appropriate glucose levels will improve 03/14/2024 1218 by Robynn Avelina LABOR, RN Outcome: Adequate for Discharge 03/14/2024 1218 by Robynn Avelina LABOR, RN Outcome: Adequate for Discharge   Problem: Nutritional: Goal: Maintenance of adequate nutrition will improve 03/14/2024 1218 by Robynn Avelina LABOR, RN Outcome: Adequate for Discharge 03/14/2024 1218 by Robynn Avelina LABOR, RN Outcome: Adequate for Discharge Goal: Progress toward achieving an optimal weight will improve 03/14/2024 1218 by Robynn Avelina LABOR, RN Outcome: Adequate for Discharge 03/14/2024 1218 by Robynn Avelina LABOR, RN Outcome: Adequate for Discharge   Problem: Skin Integrity: Goal: Risk for impaired skin integrity will decrease 03/14/2024 1218 by Robynn Avelina LABOR, RN Outcome: Adequate for Discharge 03/14/2024 1218 by Robynn Avelina LABOR, RN Outcome: Adequate for Discharge   Problem: Tissue Perfusion: Goal: Adequacy of tissue perfusion will improve 03/14/2024 1218 by Robynn Avelina LABOR, RN Outcome: Adequate for Discharge 03/14/2024 1218 by Robynn Avelina LABOR, RN Outcome: Adequate for Discharge   Problem: Education: Goal: Knowledge of General Education information will improve Description: Including pain rating scale, medication(s)/side effects and non-pharmacologic comfort measures 03/14/2024 1218 by Robynn Avelina LABOR, RN Outcome: Adequate for Discharge 03/14/2024 1218 by Robynn Avelina LABOR, RN Outcome: Adequate for Discharge   Problem: Health Behavior/Discharge Planning: Goal: Ability to manage health-related needs will improve 03/14/2024 1218 by Robynn Avelina LABOR, RN Outcome: Adequate for Discharge 03/14/2024 1218 by Robynn Avelina LABOR, RN Outcome: Adequate for Discharge   Problem: Clinical Measurements: Goal: Ability to maintain clinical measurements within normal limits will improve 03/14/2024 1218 by Robynn Avelina LABOR, RN Outcome: Adequate for Discharge 03/14/2024 1218 by Robynn Avelina LABOR, RN Outcome: Adequate for Discharge Goal: Will remain free from infection 03/14/2024 1218 by Robynn Avelina LABOR, RN Outcome: Adequate for Discharge 03/14/2024 1218 by Robynn Avelina LABOR, RN Outcome: Adequate for Discharge Goal: Diagnostic test results will improve 03/14/2024 1218 by Robynn Avelina LABOR, RN Outcome: Adequate for Discharge 03/14/2024 1218 by  Robynn Avelina LABOR, RN Outcome: Adequate for Discharge Goal: Respiratory complications will improve 03/14/2024 1218 by Robynn Avelina LABOR, RN Outcome: Adequate for Discharge 03/14/2024 1218 by Robynn Avelina LABOR, RN Outcome: Adequate for Discharge Goal: Cardiovascular complication will be avoided 03/14/2024 1218 by Robynn Avelina LABOR, RN Outcome:  Adequate for Discharge 03/14/2024 1218 by Robynn Avelina LABOR, RN Outcome: Adequate for Discharge   Problem: Activity: Goal: Risk for activity intolerance will decrease 03/14/2024 1218 by Robynn Avelina LABOR, RN Outcome: Adequate for Discharge 03/14/2024 1218 by Robynn Avelina LABOR, RN Outcome: Adequate for Discharge   Problem: Nutrition: Goal: Adequate nutrition will be maintained 03/14/2024 1218 by Robynn Avelina LABOR, RN Outcome: Adequate for Discharge 03/14/2024 1218 by Robynn Avelina LABOR, RN Outcome: Adequate for Discharge   Problem: Coping: Goal: Level of anxiety will decrease 03/14/2024 1218 by Robynn Avelina LABOR, RN Outcome: Adequate for Discharge 03/14/2024 1218 by Robynn Avelina LABOR, RN Outcome: Adequate for Discharge   Problem: Elimination: Goal: Will not experience complications related to bowel motility 03/14/2024 1218 by Robynn Avelina LABOR, RN Outcome: Adequate for Discharge 03/14/2024 1218 by Robynn Avelina LABOR, RN Outcome: Adequate for Discharge Goal: Will not experience complications related to urinary retention 03/14/2024 1218 by Robynn Avelina LABOR, RN Outcome: Adequate for Discharge 03/14/2024 1218 by Robynn Avelina LABOR, RN Outcome: Adequate for Discharge   Problem: Pain Managment: Goal: General experience of comfort will improve and/or be controlled 03/14/2024 1218 by Robynn Avelina LABOR, RN Outcome: Adequate for Discharge 03/14/2024 1218 by Robynn Avelina LABOR, RN Outcome: Adequate for Discharge   Problem: Safety: Goal: Ability to remain free from injury will improve 03/14/2024 1218 by  Robynn Avelina LABOR, RN Outcome: Adequate for Discharge 03/14/2024 1218 by Robynn Avelina LABOR, RN Outcome: Adequate for Discharge   Problem: Skin Integrity: Goal: Risk for impaired skin integrity will decrease 03/14/2024 1218 by Robynn Avelina LABOR, RN Outcome: Adequate for Discharge 03/14/2024 1218 by Robynn Avelina LABOR, RN Outcome: Adequate for Discharge

## 2024-03-14 NOTE — Progress Notes (Signed)
 Subjective: CC: No current abdominal pain. Last dose of prn pain medication was last night at 2121.  She felt like her chicken broth did not sit well last night. Describes this as indigestion that improved after Protonix . Notes history of reflux She reports she did have some nausea in the middle of the night when going to the restroom. Has not had any nausea related to PO intake. Denies vomiting.  Has since been able to tolerating FLD items without increased abdominal pain, n/v. She continues to pass flatus and had 3 liquid, non-bloody bm's over the last 24 hours.   Afebrile. HR < 100. No systolic hypotension. WBC wnl.    Objective: Vital signs in last 24 hours: Temp:  [98.5 F (36.9 C)-99.1 F (37.3 C)] 98.5 F (36.9 C) (06/27 0624) Pulse Rate:  [92-110] 98 (06/27 0624) Resp:  [17-18] 18 (06/26 2210) BP: (111-145)/(55-83) 145/83 (06/27 0624) SpO2:  [97 %-99 %] 98 % (06/27 0624) Last BM Date : 03/13/24  Intake/Output from previous day: 06/26 0701 - 06/27 0700 In: 360 [P.O.:360] Out: -  Intake/Output this shift: No intake/output data recorded.  PE: Gen:  Alert, NAD, pleasant Abd: Soft, improved upper abdominal distension, mild luq ttp that is improved from yesterday and without rigidity or guarding. Otherwise NT. +BS  Lab Results:  Recent Labs    03/13/24 0915 03/14/24 0400  WBC 8.1 6.9  HGB 13.0 12.7  HCT 40.2 39.2  PLT 280 267   BMET Recent Labs    03/13/24 0915 03/14/24 0400  NA 138 136  K 3.5 3.6  CL 106 101  CO2 23 24  GLUCOSE 221* 261*  BUN 9 6  CREATININE 0.59 0.58  CALCIUM  8.0* 8.7*   PT/INR No results for input(s): LABPROT, INR in the last 72 hours. CMP     Component Value Date/Time   NA 136 03/14/2024 0400   K 3.6 03/14/2024 0400   CL 101 03/14/2024 0400   CO2 24 03/14/2024 0400   GLUCOSE 261 (H) 03/14/2024 0400   BUN 6 03/14/2024 0400   CREATININE 0.58 03/14/2024 0400   CALCIUM  8.7 (L) 03/14/2024 0400   PROT 6.5 03/14/2024  0400   ALBUMIN 2.8 (L) 03/14/2024 0400   AST 22 03/14/2024 0400   ALT 19 03/14/2024 0400   ALKPHOS 52 03/14/2024 0400   BILITOT 0.7 03/14/2024 0400   GFRNONAA >60 03/14/2024 0400   GFRAA >60 10/21/2018 0349   Lipase     Component Value Date/Time   LIPASE 21 03/11/2024 1754    Studies/Results: DG Abd Portable 1V-Small Bowel Obstruction Protocol-initial, 8 hr delay Result Date: 03/13/2024 CLINICAL DATA:  8 hour follow-up small-bowel film EXAM: PORTABLE ABDOMEN - 1 VIEW COMPARISON:  03/12/2024 FINDINGS: Gastric catheter is noted within the stomach. Administered contrast lies partially within the proximal colon. Some residual small bowel dilatation is noted although overall improved when compared with the prior exam. No free air is noted. No bony abnormality is seen. IMPRESSION: Persistent but improved small bowel dilatation. Contrast is noted within the colon consistent with a partial small bowel obstruction. Electronically Signed   By: Oneil Devonshire M.D.   On: 03/13/2024 01:52   ECHOCARDIOGRAM COMPLETE Result Date: 03/12/2024    ECHOCARDIOGRAM REPORT   Patient Name:   DANEKA LANTIGUA Date of Exam: 03/12/2024 Medical Rec #:  [id number]          Height:       67.0 in Accession #:  [id number]         Weight:       276.2 lb Date of Birth:  [birth year]          BSA:          2.320 m Patient Age:    53 years           BP:           140/66 mmHg Patient Gender: F                  HR:           112 bpm. Exam Location:  Inpatient Procedure: Color Doppler, Cardiac Doppler and 2D Echo (Both Spectral and Color            Flow Doppler were utilized during procedure). Indications:    chest pain  History:        Patient has prior history of Echocardiogram examinations, most                 recent 03/24/2013.  Sonographer:    Benard Stallion Referring Phys: [id number] ARSHAD N KAKRAKANDY IMPRESSIONS  1. Left ventricular ejection fraction, by estimation, is 65 to 70%. The left ventricle has normal function. The left  ventricle has no regional wall motion abnormalities. Left ventricular diastolic parameters were normal.  2. Right ventricular systolic function is normal. The right ventricular size is normal.  3. The mitral valve is grossly normal. No evidence of mitral valve regurgitation. No evidence of mitral stenosis.  4. The aortic valve is normal in structure. Aortic valve regurgitation is not visualized. No aortic stenosis is present.  5. The inferior vena cava is normal in size with greater than 50% respiratory variability, suggesting right atrial pressure of 3 mmHg. FINDINGS  Left Ventricle: Left ventricular ejection fraction, by estimation, is 65 to 70%. The left ventricle has normal function. The left ventricle has no regional wall motion abnormalities. The left ventricular internal cavity size was normal in size. There is  no left ventricular hypertrophy. Left ventricular diastolic function could not be evaluated due to nondiagnostic images. Left ventricular diastolic parameters were normal. Right Ventricle: The right ventricular size is normal. No increase in right ventricular wall thickness. Right ventricular systolic function is normal. Left Atrium: Left atrial size was normal in size. Right Atrium: Right atrial size was normal in size. Pericardium: There is no evidence of pericardial effusion. Mitral Valve: The mitral valve is grossly normal. No evidence of mitral valve regurgitation. No evidence of mitral valve stenosis. Tricuspid Valve: The tricuspid valve is not well visualized. Tricuspid valve regurgitation is not demonstrated. No evidence of tricuspid stenosis. Aortic Valve: The aortic valve is normal in structure. Aortic valve regurgitation is not visualized. No aortic stenosis is present. Aortic valve mean gradient measures 8.0 mmHg. Aortic valve peak gradient measures 13.7 mmHg. Aortic valve area, by VTI measures 2.33 cm. Pulmonic Valve: The pulmonic valve was not well visualized. Pulmonic valve  regurgitation is not visualized. No evidence of pulmonic stenosis. Aorta: The aortic root is normal in size and structure. Venous: The inferior vena cava is normal in size with greater than 50% respiratory variability, suggesting right atrial pressure of 3 mmHg. IAS/Shunts: No atrial level shunt detected by color flow Doppler.  LEFT VENTRICLE PLAX 2D LVIDd:         4.10 cm   Diastology LVIDs:         2.70 cm   LV e' medial:    6.84  cm/s LV PW:         1.00 cm   LV E/e' medial:  14.8 LV IVS:        1.00 cm   LV e' lateral:   9.01 cm/s LVOT diam:     2.00 cm   LV E/e' lateral: 11.2 LV SV:         66 LV SV Index:   28 LVOT Area:     3.14 cm  RIGHT VENTRICLE RV S prime:     16.20 cm/s TAPSE (M-mode): 2.9 cm LEFT ATRIUM             Index        RIGHT ATRIUM           Index LA diam:        3.60 cm 1.55 cm/m   RA Area:     14.90 cm LA Vol (A2C):   17.8 ml 7.67 ml/m   RA Volume:   35.60 ml  15.35 ml/m LA Vol (A4C):   41.5 ml 17.89 ml/m LA Biplane Vol: 27.9 ml 12.03 ml/m  AORTIC VALVE AV Area (Vmax):    2.17 cm AV Area (Vmean):   2.13 cm AV Area (VTI):     2.33 cm AV Vmax:           185.00 cm/s AV Vmean:          129.333 cm/s AV VTI:            0.281 m AV Peak Grad:      13.7 mmHg AV Mean Grad:      8.0 mmHg LVOT Vmax:         128.00 cm/s LVOT Vmean:        87.800 cm/s LVOT VTI:          0.209 m LVOT/AV VTI ratio: 0.74  AORTA Ao Root diam: 2.40 cm MITRAL VALVE MV Area (PHT): 5.02 cm     SHUNTS MV Decel Time: 151 msec     Systemic VTI:  0.21 m MV E velocity: 101.00 cm/s  Systemic Diam: 2.00 cm MV A velocity: 154.00 cm/s MV E/A ratio:  0.66 Aditya Sabharwal Electronically signed by Ria Commander Signature Date/Time: 03/12/2024/3:31:54 PM    Final    DG Abd Portable 1V-Small Bowel Protocol-Position Verification Result Date: 03/12/2024 CLINICAL DATA:  Nasogastric tube placement. EXAM: PORTABLE ABDOMEN - 1 VIEW COMPARISON:  03/12/2024. FINDINGS: Nasogastric tube terminates in the cardiac portion of the stomach  with the side port well beyond the gastroesophageal junction. Gaseous distention of small bowel, partially imaged. IMPRESSION: 1. Nasogastric tube terminates in the stomach. 2. Small bowel obstruction. Electronically Signed   By: Newell Eke M.D.   On: 03/12/2024 15:15   DG Abd 1 View Result Date: 03/12/2024 CLINICAL DATA:  Nausea, vomiting EXAM: ABDOMEN - 1 VIEW COMPARISON:  CT from previous day FINDINGS: Stomach is partially distended. Multiple dilated small bowel loops in the mid abdomen similar in number and degree of dilatation to prior exam. The colon is decompressed. No abnormal abdominal calcifications. Regional bones unremarkable. IMPRESSION: Persistent small bowel obstruction. Electronically Signed   By: JONETTA Faes M.D.   On: 03/12/2024 07:55    Anti-infectives: Anti-infectives (From admission, onward)    None        Assessment/Plan pSBO vs ileus  - CT w/ CT A/P with multiple mildly to moderately dilated loops of jejunum containing gas and fluid without clear transition point. No history of bowel obstructions in the  past.  She has a history of prior tubal ligation and C-section.  - Patient is afebrile without tachycardia or hypotension. WBC wnl. No peritonitis on exam. There was no small bowel thickening, pneumatosis, free air or free fluid on CT. She is having bowel function. No current indication for emergency surgery - Keep K >=4, Phos >= 3, Mg >= 2 and mobilize for bowel function.   - Patient clinically and radiographically with resolving SBO. She has contrast in colon on delayed film, reports pain has resolved, is tolerating FLD items without n/v and is having bowel function. Exam reassuring as above. Will advance her to soft diet. If she tolerates diet advancement, she is okay for discharge from our standpoint.   FEN - Soft, IVF per [facility] VTE - SCDs, SQH ID - None   - Per [facility] -  Chest pain  Hx SVT Hx A. Fib DM PCOS Psoriatic arthritis on Taltz (ixekizumab)   Incidental findings - tracheobronchomalacia. LUL nodule.   I reviewed nursing notes, hospitalist notes, last 24 h vitals and pain scores, last 48 h intake and output, last 24 h labs and trends, and last 24 h imaging results.   LOS: 2 days    Ozell CHRISTELLA Shaper, [facility] 03/14/2024, 7:46 AM Please see Amion for pager number during day hours 7:00am-4:30pm

## 2024-04-06 ENCOUNTER — Encounter: Payer: Self-pay | Admitting: Podiatry

## 2024-04-07 ENCOUNTER — Other Ambulatory Visit: Payer: Self-pay | Admitting: Podiatry

## 2024-04-07 MED ORDER — DOXYCYCLINE HYCLATE 100 MG PO TABS: 100.0000 mg | ORAL_TABLET | Freq: Two times a day (BID) | ORAL | 0 refills | Status: DC

## 2024-04-14 NOTE — Telephone Encounter (Signed)
 Can we add her on for 5:30 Tuesday? Thanks!

## 2024-04-15 ENCOUNTER — Ambulatory Visit (INDEPENDENT_AMBULATORY_CARE_PROVIDER_SITE_OTHER): Admitting: Podiatry

## 2024-04-15 ENCOUNTER — Ambulatory Visit (INDEPENDENT_AMBULATORY_CARE_PROVIDER_SITE_OTHER)

## 2024-04-15 DIAGNOSIS — L03116 Cellulitis of left lower limb: Secondary | ICD-10-CM | POA: Diagnosis not present

## 2024-04-15 DIAGNOSIS — L97521 Non-pressure chronic ulcer of other part of left foot limited to breakdown of skin: Secondary | ICD-10-CM | POA: Diagnosis not present

## 2024-04-15 DIAGNOSIS — M7752 Other enthesopathy of left foot: Secondary | ICD-10-CM | POA: Diagnosis not present

## 2024-04-15 MED ORDER — DOXYCYCLINE HYCLATE 100 MG PO TABS: 100.0000 mg | ORAL_TABLET | Freq: Two times a day (BID) | ORAL | 0 refills | Status: AC

## 2024-04-15 NOTE — Progress Notes (Unsigned)
 Subjective: Chief Complaint  Patient presents with   Foot Pain    Patient is here for left foot pain between 3rd and 16th toe   53 year old female presents the office today for evaluation of infection to her left foot between 3rd and 4th toe.  Significant with doxycycline  and overall her symptoms have much improved.  She thinks that this new sock caused this.  She denies any drainage or pus.  First she was using antibiotic ointment was causing too much moisture.  Currently denies any fevers or chills.  Objective: AAO x3, NAD DP/PT pulses palpable bilaterally, CRT less than 3 seconds In the 3rd and 4th toes as well as the interspace there is a localized edema and erythema but much improved compared to the pictures that she sent previously.  There is a superficial wound present on the interspace as well without any probing, and or tunneling.  There is no drainage or pus.  No fluctuation or crepitation.  No malodor.  No pain.  No other open lesions identified. No pain with calf compression, swelling, warmth, erythema       Assessment: Ulceration left foot with resolving cellulitis  Plan: -All treatment options discussed with the patient including all alternatives, risks, complications.  -X-rays obtained reviewed.  Multiple views obtained.  No evidence of acute fracture.  No definitive evidence of acute osteomyelitis time or any soft tissue emphysema. -Will refill the doxycycline .  Discussed keeping in between the toes clean and dry. -Overall infection is improved but continue to monitor. -Monitor for any clinical signs or symptoms of infection and directed to call the office immediately should any occur or go to the ER. -Patient encouraged to call the office with any questions, concerns, change in symptoms.   Return for toe ulcer, infection as scheduled.  Donnice JONELLE Fees DPM

## 2024-04-15 NOTE — Patient Instructions (Signed)
 Monitor for any signs/symptoms of infection. Call the office immediately if any occur or go directly to the emergency room. Call with any questions/concerns.

## 2024-04-25 ENCOUNTER — Ambulatory Visit: Admitting: Podiatry

## 2024-04-25 VITALS — Ht 67.0 in | Wt 276.2 lb

## 2024-04-25 DIAGNOSIS — M79674 Pain in right toe(s): Secondary | ICD-10-CM | POA: Diagnosis not present

## 2024-04-25 DIAGNOSIS — B351 Tinea unguium: Secondary | ICD-10-CM | POA: Diagnosis not present

## 2024-04-25 DIAGNOSIS — L03116 Cellulitis of left lower limb: Secondary | ICD-10-CM

## 2024-04-25 DIAGNOSIS — E1149 Type 2 diabetes mellitus with other diabetic neurological complication: Secondary | ICD-10-CM

## 2024-04-25 DIAGNOSIS — M79675 Pain in left toe(s): Secondary | ICD-10-CM | POA: Diagnosis not present

## 2024-04-25 DIAGNOSIS — L97521 Non-pressure chronic ulcer of other part of left foot limited to breakdown of skin: Secondary | ICD-10-CM | POA: Diagnosis not present

## 2024-04-25 NOTE — Patient Instructions (Signed)
You can use UREA NAIL GEL on the thicker toenails

## 2024-04-25 NOTE — Progress Notes (Signed)
 Subjective: Chief Complaint  Patient presents with   Diabetes    Rm 11 Patient is here for diabetic foot care and nail trimming. Patient has no additional concerns today.    53 year old female presents the office today for follow-up evaluation of infection to her left foot between 3rd and 4th toe.  She states this is doing much better.  She said the wound is healed.  She has no pain or issues at this area.    Her nails are also thickened discolored comfort.  No swelling redness or drainage.  No open lesions.  No other concerns.    Last A1c was checked yesterday and is 9.1.  Objective: AAO x3, NAD DP/PT pulses palpable bilaterally, CRT less than 3 seconds On the left third interspace the wound is healed.  There is still some slight residual but is no significant cellulitis present.  There is no drainage or pus.  No fluctuation or crepitation.  There is no malodor. Bilateral nails 2 through 5 are hypertrophic and dystrophic with yellow, brown discoloration.  No edema, erythema No pain with calf compression, swelling, warmth, erythema   Assessment: Resolved ulceration left foot with resolving cellulitis; symptomatic onychomycosis   Plan: Resolved ulceration, cellulitis -Left foot doing much better.  No signs of infection at this time.  Monitor for any reoccurrence.  Discussed daily foot inspection.  Dry thoroughly between the toes.  Symptomatic onychomycosis -Nails are debrided x 8 without any complications or bleeding.  Return in about 9 weeks (around 06/27/2024).  [name] DPM

## 2024-06-27 ENCOUNTER — Encounter: Payer: Self-pay | Admitting: Podiatry

## 2024-06-27 ENCOUNTER — Ambulatory Visit: Admitting: Podiatry

## 2024-06-27 DIAGNOSIS — B351 Tinea unguium: Secondary | ICD-10-CM | POA: Diagnosis not present

## 2024-06-27 DIAGNOSIS — Z5320 Procedure and treatment not carried out because of patient's decision for unspecified reasons: Secondary | ICD-10-CM

## 2024-06-27 DIAGNOSIS — M79675 Pain in left toe(s): Secondary | ICD-10-CM | POA: Diagnosis not present

## 2024-06-27 DIAGNOSIS — M79674 Pain in right toe(s): Secondary | ICD-10-CM | POA: Diagnosis not present

## 2024-06-27 DIAGNOSIS — E1149 Type 2 diabetes mellitus with other diabetic neurological complication: Secondary | ICD-10-CM | POA: Diagnosis not present

## 2024-06-27 NOTE — Progress Notes (Signed)
 Subjective: Chief Complaint  Patient presents with   Diabetes    DFC IDDM A1C 9.2. Toenail trim.   53 year old female presents the office with above concerns.  States that she has been doing well and she is not seeing any open wounds.  No injuries or other changes that she reports today.  Last A1c was checked yesterday and is 9.2.   Objective: AAO x3, NAD DP/PT pulses palpable bilaterally, CRT less than 3 seconds Sensation decreased with SWMF.  There currently no open lesions identified bilateral feet there is dry skin present there is no skin fissures or open sores. Bilateral nails 2 through 5 are hypertrophic and dystrophic with yellow, brown discoloration.  No edema, erythema No pain with calf compression, swelling, warmth, erythema   Assessment: Diabetic foot exam; symptomatic onychomycosis   Plan: Diabetic foot exam - Doing well.  No signs ulcers or infections.  Discussed the importance today foot inspection with glucose control.  Symptomatic onychomycosis -Nails are debrided x 8 without any complications or bleeding.  Return in about 3 months (around 09/27/2024).  [name] DPM

## 2024-06-27 NOTE — Patient Instructions (Signed)

## 2024-10-10 ENCOUNTER — Ambulatory Visit: Admitting: Podiatry

## 2024-10-10 DIAGNOSIS — M79674 Pain in right toe(s): Secondary | ICD-10-CM | POA: Diagnosis not present

## 2024-10-10 DIAGNOSIS — E1149 Type 2 diabetes mellitus with other diabetic neurological complication: Secondary | ICD-10-CM | POA: Diagnosis not present

## 2024-10-10 DIAGNOSIS — B351 Tinea unguium: Secondary | ICD-10-CM | POA: Diagnosis not present

## 2024-10-10 DIAGNOSIS — B353 Tinea pedis: Secondary | ICD-10-CM

## 2024-10-10 DIAGNOSIS — L84 Corns and callosities: Secondary | ICD-10-CM | POA: Diagnosis not present

## 2024-10-10 DIAGNOSIS — M79675 Pain in left toe(s): Secondary | ICD-10-CM

## 2024-10-10 MED ORDER — KETOCONAZOLE 2 % EX CREA: 1.0000 | TOPICAL_CREAM | Freq: Every day | CUTANEOUS | 0 refills | Status: AC

## 2024-10-10 NOTE — Progress Notes (Signed)
 Subjective: No chief complaint on file.   54 year old female presents the office with for thick, elongated toenails. She also states last Tuesday she hit her foot and developed a spot. She has been keeping it clean and dry. No drainage or purulence. She also has athletes foot, and has not been using any cream on the area.   She report her A1c has been coming down.   Objective: AAO x3, NAD DP/PT pulses palpable bilaterally, CRT less than 3 seconds Sensation decreased with SWMF.  There currently no open lesions identified bilateral feet there is dry skin present there is no skin fissures or open sores. Bilateral nails 2 through 5 are hypertrophic and dystrophic with yellow, brown discoloration.  No edema, erythema Interdigital swelling at base noted along left fourth interspaces bilaterally.  There is no drainage or pus. On the plantar aspect of right foot as pictured below is a hyperkeratotic lesion with dried blood.  Once I debrided this there is new, healthy, pink skin present.  There is no ulceration.  No fluctuation or crepitation. No pain with calf compression, swelling, warmth, erythema    Assessment: Diabetic foot exam; symptomatic onychomycosis ; preulcerative lesion; tinea pedis  Plan: Right foot preulcerative callus - Sharply debrided the lesion without any complications or bleeding.  Continue offloading and moisturizer.  Tinea pedis - Prescribed ketoconazole  Symptomatic onychomycosis -Nails are debrided x 8 without any complications or bleeding.  Return in about 3 months (around 01/08/2025).  [name] DPM

## 2024-10-10 NOTE — Patient Instructions (Signed)

## 2025-01-09 ENCOUNTER — Ambulatory Visit: Admitting: Podiatry
